# Patient Record
Sex: Male | Born: 1945 | Race: White | Hispanic: No | Marital: Married | Smoking: Former smoker
Health system: Southern US, Community
[De-identification: ages and names within clinical notes are randomized; demographics above are authoritative.]

## PROBLEM LIST (undated history)

## (undated) DIAGNOSIS — E785 Hyperlipidemia, unspecified: Secondary | ICD-10-CM

## (undated) DIAGNOSIS — I10 Essential (primary) hypertension: Secondary | ICD-10-CM

## (undated) DIAGNOSIS — E669 Obesity, unspecified: Secondary | ICD-10-CM

## (undated) DIAGNOSIS — E119 Type 2 diabetes mellitus without complications: Secondary | ICD-10-CM

## (undated) DIAGNOSIS — M1990 Unspecified osteoarthritis, unspecified site: Secondary | ICD-10-CM

## (undated) DIAGNOSIS — K219 Gastro-esophageal reflux disease without esophagitis: Secondary | ICD-10-CM

## (undated) DIAGNOSIS — D580 Hereditary spherocytosis: Secondary | ICD-10-CM

## (undated) DIAGNOSIS — J189 Pneumonia, unspecified organism: Secondary | ICD-10-CM

## (undated) HISTORY — PX: COLONOSCOPY: SHX174

## (undated) HISTORY — PX: SPLENECTOMY: SUR1306

## (undated) HISTORY — PX: HERNIA REPAIR: SHX51

---

## 2009-10-15 ENCOUNTER — Emergency Department: Payer: Self-pay | Admitting: Emergency Medicine

## 2013-06-26 DIAGNOSIS — E1140 Type 2 diabetes mellitus with diabetic neuropathy, unspecified: Secondary | ICD-10-CM | POA: Insufficient documentation

## 2013-06-26 DIAGNOSIS — E782 Mixed hyperlipidemia: Secondary | ICD-10-CM | POA: Insufficient documentation

## 2013-06-26 DIAGNOSIS — E6609 Other obesity due to excess calories: Secondary | ICD-10-CM | POA: Insufficient documentation

## 2013-06-26 DIAGNOSIS — E7800 Pure hypercholesterolemia, unspecified: Secondary | ICD-10-CM | POA: Insufficient documentation

## 2014-03-22 ENCOUNTER — Ambulatory Visit: Payer: Self-pay | Admitting: Gastroenterology

## 2014-03-22 DIAGNOSIS — D126 Benign neoplasm of colon, unspecified: Secondary | ICD-10-CM

## 2014-03-22 DIAGNOSIS — K648 Other hemorrhoids: Secondary | ICD-10-CM

## 2014-03-22 HISTORY — DX: Other hemorrhoids: K64.8

## 2014-03-22 HISTORY — DX: Benign neoplasm of colon, unspecified: D12.6

## 2014-05-31 LAB — SURGICAL PATHOLOGY

## 2014-10-15 DIAGNOSIS — I10 Essential (primary) hypertension: Secondary | ICD-10-CM | POA: Insufficient documentation

## 2015-07-14 ENCOUNTER — Emergency Department
Admission: EM | Admit: 2015-07-14 | Discharge: 2015-07-14 | Disposition: A | Discharge status: Home / Self Care | Payer: Medicare Other | Attending: Emergency Medicine | Admitting: Emergency Medicine

## 2015-07-14 ENCOUNTER — Encounter: Payer: Self-pay | Admitting: Emergency Medicine

## 2015-07-14 DIAGNOSIS — Z7984 Long term (current) use of oral hypoglycemic drugs: Secondary | ICD-10-CM | POA: Insufficient documentation

## 2015-07-14 DIAGNOSIS — Z79899 Other long term (current) drug therapy: Secondary | ICD-10-CM | POA: Insufficient documentation

## 2015-07-14 DIAGNOSIS — E119 Type 2 diabetes mellitus without complications: Secondary | ICD-10-CM | POA: Insufficient documentation

## 2015-07-14 DIAGNOSIS — Z7982 Long term (current) use of aspirin: Secondary | ICD-10-CM | POA: Insufficient documentation

## 2015-07-14 DIAGNOSIS — R531 Weakness: Secondary | ICD-10-CM | POA: Diagnosis not present

## 2015-07-14 DIAGNOSIS — R42 Dizziness and giddiness: Secondary | ICD-10-CM | POA: Diagnosis present

## 2015-07-14 DIAGNOSIS — I10 Essential (primary) hypertension: Secondary | ICD-10-CM | POA: Diagnosis not present

## 2015-07-14 HISTORY — DX: Essential (primary) hypertension: I10

## 2015-07-14 HISTORY — DX: Type 2 diabetes mellitus without complications: E11.9

## 2015-07-14 LAB — BASIC METABOLIC PANEL
Anion gap: 8 (ref 5–15)
BUN: 26 mg/dL — AB (ref 6–20)
CHLORIDE: 106 mmol/L (ref 101–111)
CO2: 24 mmol/L (ref 22–32)
CREATININE: 1.32 mg/dL — AB (ref 0.61–1.24)
Calcium: 9.2 mg/dL (ref 8.9–10.3)
GFR calc Af Amer: >60 mL/min (ref >60)
GFR calc non Af Amer: 54 mL/min — ABNORMAL LOW (ref >60)
Glucose, Bld: 162 mg/dL — ABNORMAL HIGH (ref 65–99)
POTASSIUM: 4 mmol/L (ref 3.5–5.1)
Sodium: 138 mmol/L (ref 135–145)

## 2015-07-14 LAB — TROPONIN I
Troponin I: <0.03 ng/mL (ref <0.031)
Troponin I: <0.03 ng/mL (ref <0.031)

## 2015-07-14 LAB — CBC
HEMATOCRIT: 46.2 % (ref 40.0–52.0)
Hemoglobin: 16 g/dL (ref 13.0–18.0)
MCH: 28.9 pg (ref 26.0–34.0)
MCHC: 34.6 g/dL (ref 32.0–36.0)
MCV: 83.4 fL (ref 80.0–100.0)
PLATELETS: 441 10*3/uL — AB (ref 150–440)
RBC: 5.54 MIL/uL (ref 4.40–5.90)
RDW: 14.7 % — AB (ref 11.5–14.5)
WBC: 20.7 10*3/uL — ABNORMAL HIGH (ref 3.8–10.6)

## 2015-07-14 NOTE — ED Notes (Signed)
Pt given drinks for self and family. Pt tolerating well, NAD noted at this time

## 2015-07-14 NOTE — Discharge Instructions (Signed)

## 2015-07-14 NOTE — ED Notes (Signed)
Pt and wife report pt with BP 50/48 at home on 3 different machines; pt with dizziness and lightheadedness.

## 2015-07-14 NOTE — ED Provider Notes (Signed)
[facility] Emergency Department Provider Note   ____________________________________________  Time seen: Approximately 940 PM  I have reviewed the triage vital signs and the nursing notes.   HISTORY  Chief Complaint Dizziness    HPI Brent [name]. is a 69 y.o. male with a history of diabetes and hypertension who is presenting to the emergency department with an episode of weakness this afternoon. He says that he was mowing for lawns and then became weak. He denies feeling like he would pass out. He denies any chest pain or shortness of breath. Says that he feels back to normal at this time and says that he has been feeling normal since he has arrived at the emergency department. Denies any dizziness. Michela Pitcher that he only had a half of a diet drink today and did not trigger any water despite working outside for a lengthy period of time. When he felt the weakness he took his blood pressure and was found to be hypotensive at home on multiple blood pressure machines. At the lowest he was a 50 systolic blood pressure.   Past Medical History  Diagnosis Date  . Hypertension   . Diabetes mellitus without complication ([place])     There are no active problems to display for this patient.   Past Surgical History  Procedure Laterality Date  . Splenectomy      Current Outpatient Rx  Name  Route  Sig  Dispense  Refill  . aspirin EC 81 MG tablet   Oral   Take 1 tablet by mouth daily.         . hydrochlorothiazide (HYDRODIURIL) 12.5 MG tablet   Oral   Take 1 tablet by mouth daily.         Marland Kitchen lisinopril (PRINIVIL,ZESTRIL) 40 MG tablet   Oral   Take 1 tablet by mouth daily.         Marland Kitchen lovastatin (MEVACOR) 40 MG tablet   Oral   Take 2 tablets by mouth at bedtime.         . metFORMIN (GLUCOPHAGE) 1000 MG tablet   Oral   Take 500 mg by mouth 2 (two) times daily with a meal.            Allergies Review of patient's allergies indicates no known  allergies.  No family history on file.  Social History Social History  Substance Use Topics  . Smoking status: Former Research scientist (life sciences)  . Smokeless tobacco: None  . Alcohol Use: None    Review of Systems Constitutional: No fever/chills Eyes: No visual changes. ENT: No sore throat. Cardiovascular: Denies chest pain. Respiratory: Denies shortness of breath. Gastrointestinal: No abdominal pain.  No nausea, no vomiting.  No diarrhea.  No constipation. Genitourinary: Negative for dysuria. Musculoskeletal: Negative for back pain. Skin: Negative for rash. Neurological: Negative for headaches, focal weakness or numbness.  10-point ROS otherwise negative.  ____________________________________________   PHYSICAL EXAM:  VITAL SIGNS: ED Triage Vitals  Enc Vitals Group     BP 07/14/15 1834 138/68 mmHg     Pulse Rate 07/14/15 1834 85     Resp 07/14/15 1834 16     Temp 07/14/15 1834 97.7 F (36.5 C)     Temp Source 07/14/15 1834 Oral     SpO2 07/14/15 1834 94 %     Weight 07/14/15 1834 216 lb (97.977 kg)     Height 07/14/15 1834 5\' 8"  (1.727 m)     Head Cir --  Peak Flow --      Pain Score --      Pain Loc --      Pain Edu? --      Excl. in [place]? --     Constitutional: Alert and oriented. Well appearing and in no acute distress. Eyes: Conjunctivae are normal. PERRL. EOMI. Head: Atraumatic. Nose: No congestion/rhinnorhea. Mouth/Throat: Mucous membranes are moist.   Neck: No stridor.   Cardiovascular: Normal rate, regular rhythm. Grossly normal heart sounds.   Respiratory: Normal respiratory effort.  No retractions. Lungs CTAB. Gastrointestinal: Soft and nontender. No distention.  Musculoskeletal: No lower extremity tenderness nor edema.  No joint effusions. Neurologic:  Normal speech and language. No gross focal neurologic deficits are appreciated. No gait instability. Skin:  Skin is warm, dry and intact. No rash noted. Psychiatric: Mood and affect are normal. Speech and  behavior are normal.  ____________________________________________   LABS (all labs ordered are listed, but only abnormal results are displayed)  Labs Reviewed  BASIC METABOLIC PANEL - Abnormal; Notable for the following:    Glucose, Bld 162 (*)    BUN 26 (*)    Creatinine, Ser 1.32 (*)    GFR calc non Af Amer 54 (*)    All other components within normal limits  CBC - Abnormal; Notable for the following:    WBC 20.7 (*)    RDW 14.7 (*)    Platelets 441 (*)    All other components within normal limits  TROPONIN I  TROPONIN I  URINALYSIS COMPLETEWITH MICROSCOPIC ([facility] ONLY)   ____________________________________________  EKG  ED ECG REPORT I, Doran Stabler, the attending physician, personally viewed and interpreted this ECG.   Date: 07/14/2015  EKG Time: 1843  Rate: 93  Rhythm: normal sinus rhythm  Axis: Normal  Intervals:first-degree A-V block right bundle branch block  ST&T Change: T wave inversions in V2 and V3. No previous EKG for comparison. ____________________________________________  RADIOLOGY   ____________________________________________   PROCEDURES  ____________________________________________   INITIAL IMPRESSION / ASSESSMENT AND PLAN / ED COURSE  Pertinent labs & imaging results that were available during my care of the patient were reviewed by me and considered in my medical decision making (see chart for details).  ----------------------------------------- 11:17 PM on 07/14/2015 -----------------------------------------  Patient reevaluated. Discussed the lab results in clearing 2 negative troponins. Was found to have some signs of mild dehydration on his renal function. Also with a white blood cell count of 20. The patient knows that he has a chronically elevated white blood cell count but it is usually more around 14 or so. He denies any complaints at this time. Denied any fever. Unclear cause for the elevated white blood cell count.  It gave him strict return precautions come back for any worsening or concerning symptoms especially fever or he understands this plan and is willing to comply. Says that he is hungry and would like to go home. He also knows that he will need to stay hydrated. ____________________________________________   FINAL CLINICAL IMPRESSION(S) / ED DIAGNOSES  Weakness    NEW MEDICATIONS STARTED DURING THIS VISIT:  New Prescriptions   No medications on file     Note:  This document was prepared using Dragon voice recognition software and may include unintentional dictation errors.     Orbie Pyo, MD 07/14/15 [phone]

## 2015-10-07 HISTORY — PX: CARPAL TUNNEL RELEASE: SHX101

## 2016-03-05 DIAGNOSIS — Z862 Personal history of diseases of the blood and blood-forming organs and certain disorders involving the immune mechanism: Secondary | ICD-10-CM | POA: Insufficient documentation

## 2017-08-02 ENCOUNTER — Encounter: Payer: Self-pay | Admitting: *Deleted

## 2017-08-05 ENCOUNTER — Encounter
Admission: RE | Disposition: A | Discharge status: Home / Self Care | Payer: Self-pay | Source: Ambulatory Visit | Attending: Unknown Physician Specialty

## 2017-08-05 ENCOUNTER — Ambulatory Visit: Payer: Medicare Other | Admitting: Anesthesiology

## 2017-08-05 ENCOUNTER — Ambulatory Visit
Admission: RE | Admit: 2017-08-05 | Discharge: 2017-08-05 | Disposition: A | Discharge status: Home / Self Care | Payer: Medicare Other | Source: Ambulatory Visit | Attending: Unknown Physician Specialty | Admitting: Unknown Physician Specialty

## 2017-08-05 DIAGNOSIS — E785 Hyperlipidemia, unspecified: Secondary | ICD-10-CM | POA: Diagnosis not present

## 2017-08-05 DIAGNOSIS — Z1211 Encounter for screening for malignant neoplasm of colon: Secondary | ICD-10-CM | POA: Insufficient documentation

## 2017-08-05 DIAGNOSIS — Z86010 Personal history of colonic polyps: Secondary | ICD-10-CM | POA: Insufficient documentation

## 2017-08-05 DIAGNOSIS — D124 Benign neoplasm of descending colon: Secondary | ICD-10-CM | POA: Diagnosis not present

## 2017-08-05 DIAGNOSIS — E119 Type 2 diabetes mellitus without complications: Secondary | ICD-10-CM | POA: Insufficient documentation

## 2017-08-05 DIAGNOSIS — Z7984 Long term (current) use of oral hypoglycemic drugs: Secondary | ICD-10-CM | POA: Diagnosis not present

## 2017-08-05 DIAGNOSIS — Z87891 Personal history of nicotine dependence: Secondary | ICD-10-CM | POA: Insufficient documentation

## 2017-08-05 DIAGNOSIS — Z7982 Long term (current) use of aspirin: Secondary | ICD-10-CM | POA: Insufficient documentation

## 2017-08-05 DIAGNOSIS — K640 First degree hemorrhoids: Secondary | ICD-10-CM | POA: Insufficient documentation

## 2017-08-05 DIAGNOSIS — I10 Essential (primary) hypertension: Secondary | ICD-10-CM | POA: Insufficient documentation

## 2017-08-05 DIAGNOSIS — Z79899 Other long term (current) drug therapy: Secondary | ICD-10-CM | POA: Insufficient documentation

## 2017-08-05 DIAGNOSIS — D122 Benign neoplasm of ascending colon: Secondary | ICD-10-CM | POA: Insufficient documentation

## 2017-08-05 DIAGNOSIS — D123 Benign neoplasm of transverse colon: Secondary | ICD-10-CM | POA: Insufficient documentation

## 2017-08-05 HISTORY — DX: Hyperlipidemia, unspecified: E78.5

## 2017-08-05 HISTORY — PX: COLONOSCOPY WITH PROPOFOL: SHX5780

## 2017-08-05 LAB — GLUCOSE, CAPILLARY: GLUCOSE-CAPILLARY: 125 mg/dL — AB (ref 70–99)

## 2017-08-05 SURGERY — COLONOSCOPY WITH PROPOFOL
Anesthesia: General

## 2017-08-05 MED ORDER — PROPOFOL 10 MG/ML IV BOLUS
INTRAVENOUS | Status: DC | PRN
  Administered 2017-08-05: 50 mg via INTRAVENOUS

## 2017-08-05 MED ORDER — SODIUM CHLORIDE 0.9 % IV SOLN: INTRAVENOUS | Status: DC

## 2017-08-05 MED ORDER — EPHEDRINE SULFATE 50 MG/ML IJ SOLN
INTRAMUSCULAR | Status: DC | PRN
  Administered 2017-08-05 (×2): 10 mg via INTRAVENOUS

## 2017-08-05 MED ORDER — PHENYLEPHRINE HCL 10 MG/ML IJ SOLN
INTRAMUSCULAR | Status: DC | PRN
  Administered 2017-08-05: 100 ug via INTRAVENOUS

## 2017-08-05 MED ORDER — FENTANYL CITRATE (PF) 100 MCG/2ML IJ SOLN
INTRAMUSCULAR | Status: DC | PRN
  Administered 2017-08-05: 25 ug via INTRAVENOUS

## 2017-08-05 MED ORDER — FENTANYL CITRATE (PF) 100 MCG/2ML IJ SOLN
INTRAMUSCULAR | Status: AC
  Filled 2017-08-05: qty 2

## 2017-08-05 MED ORDER — SODIUM CHLORIDE 0.9 % IV SOLN
INTRAVENOUS | Status: DC
  Administered 2017-08-05: 07:00:00 via INTRAVENOUS

## 2017-08-05 MED ORDER — PROPOFOL 500 MG/50ML IV EMUL
INTRAVENOUS | Status: AC
Start: 2017-08-05 — End: 2017-08-05
  Filled 2017-08-05: qty 50

## 2017-08-05 MED ORDER — PROPOFOL 500 MG/50ML IV EMUL
INTRAVENOUS | Status: DC | PRN
  Administered 2017-08-05: 180 ug/kg/min via INTRAVENOUS

## 2017-08-05 NOTE — Anesthesia Procedure Notes (Signed)
Date/Time: 08/05/2017 7:40 AM Performed by: Allean Found, CRNA Pre-anesthesia Checklist: Patient identified, Emergency Drugs available, Suction available, Patient being monitored and Timeout performed Patient Re-evaluated:Patient Re-evaluated prior to induction Oxygen Delivery Method: Simple face mask Induction Type: IV induction Placement Confirmation: positive ETCO2

## 2017-08-05 NOTE — Anesthesia Postprocedure Evaluation (Signed)
Anesthesia Post Note  Patient: Brent [name].  Procedure(s) Performed: COLONOSCOPY WITH PROPOFOL (N/A )  Patient location during evaluation: Endoscopy Anesthesia Type: General Level of consciousness: awake and alert Pain management: pain level controlled Vital Signs Assessment: post-procedure vital signs reviewed and stable Respiratory status: spontaneous breathing, nonlabored ventilation, respiratory function stable and patient connected to nasal cannula oxygen Cardiovascular status: blood pressure returned to baseline and stable Postop Assessment: no apparent nausea or vomiting Anesthetic complications: no     Last Vitals:  Vitals:   08/05/17 0841 08/05/17 0851  BP: (!) 111/51 123/64  Pulse: (!) 59 60  Resp: 19 18  Temp:    SpO2: 96% 94%    Last Pain:  Vitals:   08/05/17 0821  TempSrc: Tympanic  PainSc: 0-No pain                 Silas Sedam S

## 2017-08-05 NOTE — Transfer of Care (Signed)
Immediate Anesthesia Transfer of Care Note  Patient: Brent [name].  Procedure(s) Performed: COLONOSCOPY WITH PROPOFOL (N/A )  Patient Location: PACU  Anesthesia Type:General  Level of Consciousness: awake  Airway & Oxygen Therapy: Patient Spontanous Breathing and Patient connected to face mask oxygen  Post-op Assessment: Report given to RN and Post -op Vital signs reviewed and stable  Post vital signs: Reviewed and stable  Last Vitals:  Vitals Value Taken Time  BP    Temp 36.1 C 08/05/2017  8:21 AM  Pulse    Resp    SpO2      Last Pain:  Vitals:   08/05/17 0821  TempSrc: Tympanic         Complications: No apparent anesthesia complications

## 2017-08-05 NOTE — H&P (Signed)
Primary Care Physician:  [name], MD Primary Gastroenterologist:  Dr. [name]  Pre-Procedure History & Physical: HPI:  Cuauhtemoc Huegel. is a 72 y.o. male is here for an colonoscopy.  Done for history of colon polyps.   Past Medical History:  Diagnosis Date  . Diabetes mellitus without complication ([place])   . Hyperlipidemia   . Hypertension     Past Surgical History:  Procedure Laterality Date  . CARPAL TUNNEL RELEASE Left 10/2015  . COLONOSCOPY    . SPLENECTOMY      Prior to Admission medications   Medication Sig Start Date End Date Taking? Authorizing Provider  atorvastatin (LIPITOR) 40 MG tablet Take 40 mg by mouth daily.   Yes [provider]  triamcinolone cream (KENALOG) 0.5 % Apply 1 application topically 2 (two) times daily.   Yes [provider]  aspirin EC 81 MG tablet Take 1 tablet by mouth daily.    [provider]  hydrochlorothiazide (HYDRODIURIL) 12.5 MG tablet Take 1 tablet by mouth daily. 06/30/15   [provider]  lisinopril (PRINIVIL,ZESTRIL) 40 MG tablet Take 1 tablet by mouth daily. 05/01/15   [provider]  lovastatin (MEVACOR) 40 MG tablet Take 2 tablets by mouth at bedtime. 03/14/15   [provider]  metFORMIN (GLUCOPHAGE) 1000 MG tablet Take 500 mg by mouth 2 (two) times daily with a meal.  04/28/15   [provider]    Allergies as of 05/29/2017  . (No Known Allergies)    History reviewed. No pertinent family history.  Social History   Socioeconomic History  . Marital status: Married    Spouse name: Not on file  . Number of children: Not on file  . Years of education: Not on file  . Highest education level: Not on file  Occupational History  . Not on file  Social Needs  . Financial resource strain: Not on file  . Food insecurity:    Worry: Not on file    Inability: Not on file  . Transportation needs:    Medical: Not on file    Non-medical: Not on file   Tobacco Use  . Smoking status: Former Research scientist (life sciences)  . Smokeless tobacco: Never Used  Substance and Sexual Activity  . Alcohol use: Not Currently  . Drug use: Never  . Sexual activity: Not on file  Lifestyle  . Physical activity:    Days per week: Not on file    Minutes per session: Not on file  . Stress: Not on file  Relationships  . Social connections:    Talks on phone: Not on file    Gets together: Not on file    Attends religious service: Not on file    Active member of club or organization: Not on file    Attends meetings of clubs or organizations: Not on file    Relationship status: Not on file  . Intimate partner violence:    Fear of current or ex partner: Not on file    Emotionally abused: Not on file    Physically abused: Not on file    Forced sexual activity: Not on file  Other Topics Concern  . Not on file  Social History Narrative  . Not on file    Review of Systems: See HPI, otherwise negative ROS  Physical Exam: BP (!) 170/81   Pulse 64   Temp (!) 97.5 F (36.4 C) (Tympanic)   Resp 20   Ht 5\' 8"  (1.727 m)  Wt 99.8 kg (220 lb)   SpO2 100%   BMI 33.45 kg/m  General:   Alert,  pleasant and cooperative in NAD Head:  Normocephalic and atraumatic. Neck:  Supple; no masses or thyromegaly. Lungs:  Clear throughout to auscultation.    Heart:  Regular rate and rhythm. Abdomen:  Soft, nontender and nondistended. Normal bowel sounds, without guarding, and without rebound.   Neurologic:  Alert and  oriented x4;  grossly normal neurologically.  Impression/Plan: Wynona Canes. is here for an colonoscopy to be performed for [facility] colon polyps.  Risks, benefits, limitations, and alternatives regarding  colonoscopy have been reviewed with the patient.  Questions have been answered.  All parties agreeable.   Gaylyn Cheers, MD  08/05/2017, 7:30 AM

## 2017-08-05 NOTE — Anesthesia Post-op Follow-up Note (Signed)
Anesthesia QCDR form completed.        

## 2017-08-05 NOTE — Anesthesia Preprocedure Evaluation (Signed)
Anesthesia Evaluation  Patient identified by MRN, date of birth, ID band Patient awake    Reviewed: Allergy & Precautions, NPO status , Patient's Chart, lab work & pertinent test results, reviewed documented beta blocker date and time   Airway Mallampati: III  TM Distance: >3 FB     Dental  (+) Chipped   Pulmonary former smoker,           Cardiovascular hypertension, Pt. on medications      Neuro/Psych    GI/Hepatic   Endo/Other  diabetes, Type 2  Renal/GU      Musculoskeletal   Abdominal   Peds  Hematology   Anesthesia Other Findings Obese.  Reproductive/Obstetrics                             Anesthesia Physical Anesthesia Plan  ASA: III  Anesthesia Plan: General   Post-op Pain Management:    Induction: Intravenous  PONV Risk Score and Plan:   Airway Management Planned:   Additional Equipment:   Intra-op Plan:   Post-operative Plan:   Informed Consent: I have reviewed the patients History and Physical, chart, labs and discussed the procedure including the risks, benefits and alternatives for the proposed anesthesia with the patient or authorized representative who has indicated his/her understanding and acceptance.     Plan Discussed with: CRNA  Anesthesia Plan Comments:         Anesthesia Quick Evaluation

## 2017-08-05 NOTE — Op Note (Signed)
[facility] Patient Name: Brent [name] Procedure Date: 08/05/2017 7:32 AM MRN: [id number] Account #: [id number] Date of Birth: 30-Dec-1945 Admit Type: Outpatient Age: 72 Room: [facility] ENDO ROOM 1 Gender: Male Note Status: Finalized Procedure:            Colonoscopy Indications:          High risk colon cancer surveillance: Personal history                        of colonic polyps Providers:            Manya Silvas, MD Referring MD:         [name] (Referring MD) Medicines:            Propofol per Anesthesia Complications:        No immediate complications. Procedure:            Pre-Anesthesia Assessment:                       - After reviewing the risks and benefits, the patient                        was deemed in satisfactory condition to undergo the                        procedure.                       After obtaining informed consent, the colonoscope was                        passed under direct vision. Throughout the procedure,                        the patient's blood pressure, pulse, and oxygen                        saturations were monitored continuously. The                        Colonoscope was introduced through the anus and                        advanced to the the cecum, identified by appendiceal                        orifice and ileocecal valve. The colonoscopy was                        performed without difficulty. The patient tolerated the                        procedure well. The quality of the bowel preparation                        was good. Findings:      A small polyp was found in the ascending colon. The polyp was sessile.       The polyp was removed with a hot snare. Resection and retrieval were       complete. Biopsies for histology were taken with a cold forceps for  histology.      A diminutive polyp was found in the transverse colon. The polyp was       sessile. The polyp was removed with a hot  snare. Resection and retrieval       were complete.      A diminutive polyp was found in the transverse colon. The polyp was       sessile. The polyp was removed with a jumbo cold forceps. Resection and       retrieval were complete.      Two sessile polyps were found in the descending colon. The polyps were       diminutive in size. These polyps were removed with a hot snare.       Resection and retrieval were complete.      A diminutive polyp was found in the sigmoid colon. The polyp was       sessile. The polyp was removed with a jumbo cold forceps. Resection and       retrieval were complete.      Internal hemorrhoids were found during endoscopy. The hemorrhoids were       small and Grade I (internal hemorrhoids that do not prolapse).      The exam was otherwise without abnormality. Impression:           - One small polyp in the ascending colon, removed with                        a hot snare. Resected and retrieved. Biopsied.                       - One diminutive polyp in the transverse colon, removed                        with a hot snare. Resected and retrieved.                       - One diminutive polyp in the transverse colon, removed                        with a jumbo cold forceps. Resected and retrieved.                       - Two diminutive polyps in the descending colon,                        removed with a jumbo cold forceps. Resected and                        retrieved.                       - Two diminutive polyps in the descending colon,                        removed with a hot snare. Resected and retrieved.                       - One diminutive polyp in the sigmoid colon, removed                        with a jumbo cold forceps. Resected and  retrieved.                       - Internal hemorrhoids.                       - The examination was otherwise normal. Recommendation:       - Await pathology results. Manya Silvas, MD 08/05/2017 8:24:28 AM This  report has been signed electronically. Number of Addenda: 0 Note Initiated On: 08/05/2017 7:32 AM      [facility]

## 2017-08-06 ENCOUNTER — Encounter: Payer: Self-pay | Admitting: Unknown Physician Specialty

## 2017-08-06 LAB — SURGICAL PATHOLOGY

## 2017-10-08 DIAGNOSIS — N528 Other male erectile dysfunction: Secondary | ICD-10-CM | POA: Insufficient documentation

## 2018-05-06 DIAGNOSIS — F5104 Psychophysiologic insomnia: Secondary | ICD-10-CM | POA: Insufficient documentation

## 2018-05-15 ENCOUNTER — Inpatient Hospital Stay
Admission: EM | Admit: 2018-05-15 | Discharge: 2018-05-20 | DRG: 871 | Disposition: A | Discharge status: Home Health | Payer: Medicare Other | Attending: Internal Medicine | Admitting: Internal Medicine

## 2018-05-15 ENCOUNTER — Encounter: Payer: Self-pay | Admitting: Emergency Medicine

## 2018-05-15 ENCOUNTER — Emergency Department: Payer: Medicare Other

## 2018-05-15 ENCOUNTER — Other Ambulatory Visit: Payer: Self-pay

## 2018-05-15 DIAGNOSIS — E876 Hypokalemia: Secondary | ICD-10-CM | POA: Diagnosis not present

## 2018-05-15 DIAGNOSIS — K8020 Calculus of gallbladder without cholecystitis without obstruction: Secondary | ICD-10-CM

## 2018-05-15 DIAGNOSIS — K8050 Calculus of bile duct without cholangitis or cholecystitis without obstruction: Secondary | ICD-10-CM | POA: Diagnosis not present

## 2018-05-15 DIAGNOSIS — Z9081 Acquired absence of spleen: Secondary | ICD-10-CM

## 2018-05-15 DIAGNOSIS — Z87891 Personal history of nicotine dependence: Secondary | ICD-10-CM

## 2018-05-15 DIAGNOSIS — Z6834 Body mass index (BMI) 34.0-34.9, adult: Secondary | ICD-10-CM

## 2018-05-15 DIAGNOSIS — I4510 Unspecified right bundle-branch block: Secondary | ICD-10-CM | POA: Diagnosis present

## 2018-05-15 DIAGNOSIS — D72829 Elevated white blood cell count, unspecified: Secondary | ICD-10-CM

## 2018-05-15 DIAGNOSIS — E785 Hyperlipidemia, unspecified: Secondary | ICD-10-CM | POA: Diagnosis present

## 2018-05-15 DIAGNOSIS — J9811 Atelectasis: Secondary | ICD-10-CM | POA: Diagnosis not present

## 2018-05-15 DIAGNOSIS — J9601 Acute respiratory failure with hypoxia: Secondary | ICD-10-CM | POA: Diagnosis not present

## 2018-05-15 DIAGNOSIS — R748 Abnormal levels of other serum enzymes: Secondary | ICD-10-CM | POA: Diagnosis not present

## 2018-05-15 DIAGNOSIS — R0902 Hypoxemia: Secondary | ICD-10-CM

## 2018-05-15 DIAGNOSIS — N179 Acute kidney failure, unspecified: Secondary | ICD-10-CM | POA: Diagnosis not present

## 2018-05-15 DIAGNOSIS — I700 Atherosclerosis of aorta: Secondary | ICD-10-CM | POA: Diagnosis present

## 2018-05-15 DIAGNOSIS — Z833 Family history of diabetes mellitus: Secondary | ICD-10-CM | POA: Diagnosis not present

## 2018-05-15 DIAGNOSIS — Z7984 Long term (current) use of oral hypoglycemic drugs: Secondary | ICD-10-CM

## 2018-05-15 DIAGNOSIS — K8040 Calculus of bile duct with cholecystitis, unspecified, without obstruction: Secondary | ICD-10-CM

## 2018-05-15 DIAGNOSIS — I10 Essential (primary) hypertension: Secondary | ICD-10-CM | POA: Diagnosis present

## 2018-05-15 DIAGNOSIS — K269 Duodenal ulcer, unspecified as acute or chronic, without hemorrhage or perforation: Secondary | ICD-10-CM | POA: Diagnosis present

## 2018-05-15 DIAGNOSIS — E872 Acidosis, unspecified: Secondary | ICD-10-CM

## 2018-05-15 DIAGNOSIS — R935 Abnormal findings on diagnostic imaging of other abdominal regions, including retroperitoneum: Secondary | ICD-10-CM

## 2018-05-15 DIAGNOSIS — K8000 Calculus of gallbladder with acute cholecystitis without obstruction: Secondary | ICD-10-CM | POA: Diagnosis not present

## 2018-05-15 DIAGNOSIS — E119 Type 2 diabetes mellitus without complications: Secondary | ICD-10-CM | POA: Diagnosis present

## 2018-05-15 DIAGNOSIS — K8043 Calculus of bile duct with acute cholecystitis with obstruction: Secondary | ICD-10-CM | POA: Diagnosis present

## 2018-05-15 DIAGNOSIS — E669 Obesity, unspecified: Secondary | ICD-10-CM | POA: Diagnosis present

## 2018-05-15 DIAGNOSIS — Z7982 Long term (current) use of aspirin: Secondary | ICD-10-CM | POA: Diagnosis not present

## 2018-05-15 DIAGNOSIS — A419 Sepsis, unspecified organism: Secondary | ICD-10-CM | POA: Diagnosis present

## 2018-05-15 DIAGNOSIS — R1011 Right upper quadrant pain: Secondary | ICD-10-CM | POA: Diagnosis present

## 2018-05-15 DIAGNOSIS — Z8249 Family history of ischemic heart disease and other diseases of the circulatory system: Secondary | ICD-10-CM

## 2018-05-15 LAB — CBC WITH DIFFERENTIAL/PLATELET
Abs Immature Granulocytes: 0.47 10*3/uL — ABNORMAL HIGH (ref 0.00–0.07)
Basophils Absolute: 0.1 10*3/uL (ref 0.0–0.1)
Basophils Relative: 0 %
Eosinophils Absolute: 0 10*3/uL (ref 0.0–0.5)
Eosinophils Relative: 0 %
HCT: 47.5 % (ref 39.0–52.0)
Hemoglobin: 16.2 g/dL (ref 13.0–17.0)
Immature Granulocytes: 1 %
Lymphocytes Relative: 3 %
Lymphs Abs: 1.3 10*3/uL (ref 0.7–4.0)
MCH: 30.2 pg (ref 26.0–34.0)
MCHC: 34.1 g/dL (ref 30.0–36.0)
MCV: 88.6 fL (ref 80.0–100.0)
Monocytes Absolute: 4.4 10*3/uL — ABNORMAL HIGH (ref 0.1–1.0)
Monocytes Relative: 10 %
Neutro Abs: 37.7 10*3/uL — ABNORMAL HIGH (ref 1.7–7.7)
Neutrophils Relative %: 86 %
Platelets: 501 10*3/uL — ABNORMAL HIGH (ref 150–400)
RBC: 5.36 MIL/uL (ref 4.22–5.81)
RDW: 12.9 % (ref 11.5–15.5)
Smear Review: NORMAL
WBC: 44.1 10*3/uL — ABNORMAL HIGH (ref 4.0–10.5)
nRBC: 0 % (ref 0.0–0.2)

## 2018-05-15 LAB — COMPREHENSIVE METABOLIC PANEL
ALT: 15 U/L (ref 0–44)
AST: 25 U/L (ref 15–41)
Albumin: 4.2 g/dL (ref 3.5–5.0)
Alkaline Phosphatase: 78 U/L (ref 38–126)
Anion gap: 16 — ABNORMAL HIGH (ref 5–15)
BUN: 29 mg/dL — ABNORMAL HIGH (ref 8–23)
CO2: 20 mmol/L — ABNORMAL LOW (ref 22–32)
Calcium: 9.7 mg/dL (ref 8.9–10.3)
Chloride: 98 mmol/L (ref 98–111)
Creatinine, Ser: 1.24 mg/dL (ref 0.61–1.24)
GFR calc Af Amer: >60 mL/min (ref >60)
GFR calc non Af Amer: 58 mL/min — ABNORMAL LOW (ref >60)
Glucose, Bld: 342 mg/dL — ABNORMAL HIGH (ref 70–99)
Potassium: 4.1 mmol/L (ref 3.5–5.1)
Sodium: 134 mmol/L — ABNORMAL LOW (ref 135–145)
Total Bilirubin: 1.9 mg/dL — ABNORMAL HIGH (ref 0.3–1.2)
Total Protein: 8.7 g/dL — ABNORMAL HIGH (ref 6.5–8.1)

## 2018-05-15 LAB — URINALYSIS, COMPLETE (UACMP) WITH MICROSCOPIC
Bacteria, UA: NONE SEEN
Bilirubin Urine: NEGATIVE
Glucose, UA: >=500 mg/dL — AB
Hgb urine dipstick: NEGATIVE
Ketones, ur: NEGATIVE mg/dL
Leukocytes,Ua: NEGATIVE
Nitrite: NEGATIVE
Protein, ur: 30 mg/dL — AB
Specific Gravity, Urine: >1.046 — ABNORMAL HIGH (ref 1.005–1.030)
pH: 5 (ref 5.0–8.0)

## 2018-05-15 LAB — GLUCOSE, CAPILLARY
Glucose-Capillary: 310 mg/dL — ABNORMAL HIGH (ref 70–99)
Glucose-Capillary: 236 mg/dL — ABNORMAL HIGH (ref 70–99)
Glucose-Capillary: 171 mg/dL — ABNORMAL HIGH (ref 70–99)

## 2018-05-15 LAB — SURGICAL PCR SCREEN
MRSA, PCR: POSITIVE — AB
Staphylococcus aureus: POSITIVE — AB

## 2018-05-15 LAB — LIPASE, BLOOD: Lipase: 24 U/L (ref 11–51)

## 2018-05-15 MED ORDER — HYDROCODONE-ACETAMINOPHEN 5-325 MG PO TABS
1.0000 | ORAL_TABLET | ORAL | Status: DC | PRN
  Administered 2018-05-15 – 2018-05-16 (×4): 2 via ORAL
  Filled 2018-05-15 (×5): qty 2

## 2018-05-15 MED ORDER — TADALAFIL 20 MG PO TABS: 10.0000 mg | ORAL_TABLET | ORAL | Status: DC

## 2018-05-15 MED ORDER — MUPIROCIN 2 % EX OINT
1.0000 "application " | TOPICAL_OINTMENT | Freq: Two times a day (BID) | CUTANEOUS | Status: DC
  Filled 2018-05-15: qty 22

## 2018-05-15 MED ORDER — ALBUTEROL SULFATE (2.5 MG/3ML) 0.083% IN NEBU: 2.5000 mg | INHALATION_SOLUTION | RESPIRATORY_TRACT | Status: DC | PRN

## 2018-05-15 MED ORDER — HYDROMORPHONE HCL 1 MG/ML IJ SOLN: 1.0000 mg | INTRAMUSCULAR | Status: AC

## 2018-05-15 MED ORDER — ONDANSETRON HCL 4 MG/2ML IJ SOLN
4.0000 mg | Freq: Once | INTRAMUSCULAR | Status: AC
  Administered 2018-05-15: 4 mg via INTRAVENOUS
  Filled 2018-05-15: qty 2

## 2018-05-15 MED ORDER — CHLORHEXIDINE GLUCONATE CLOTH 2 % EX PADS
6.0000 | MEDICATED_PAD | Freq: Every day | CUTANEOUS | Status: DC
  Administered 2018-05-16 – 2018-05-18 (×3): 6 via TOPICAL

## 2018-05-15 MED ORDER — MUPIROCIN 2 % EX OINT
1.0000 "application " | TOPICAL_OINTMENT | Freq: Two times a day (BID) | CUTANEOUS | Status: DC
  Administered 2018-05-15 – 2018-05-20 (×9): 1 via NASAL
  Filled 2018-05-15: qty 22

## 2018-05-15 MED ORDER — LISINOPRIL 20 MG PO TABS
40.0000 mg | ORAL_TABLET | Freq: Every day | ORAL | Status: DC
  Administered 2018-05-15 – 2018-05-16 (×2): 40 mg via ORAL
  Filled 2018-05-15 (×2): qty 2

## 2018-05-15 MED ORDER — POLYETHYLENE GLYCOL 3350 17 G PO PACK: 17.0000 g | PACK | Freq: Every day | ORAL | Status: DC | PRN

## 2018-05-15 MED ORDER — ONDANSETRON HCL 4 MG PO TABS: 4.0000 mg | ORAL_TABLET | Freq: Four times a day (QID) | ORAL | Status: DC | PRN

## 2018-05-15 MED ORDER — HYDROMORPHONE HCL 1 MG/ML IJ SOLN
1.0000 mg | Freq: Once | INTRAMUSCULAR | Status: AC
  Administered 2018-05-15 (×2): 1 mg via INTRAVENOUS
  Filled 2018-05-15: qty 1

## 2018-05-15 MED ORDER — SODIUM CHLORIDE 0.9 % IV SOLN
3.0000 g | Freq: Four times a day (QID) | INTRAVENOUS | Status: DC
  Administered 2018-05-15 – 2018-05-20 (×19): 3 g via INTRAVENOUS
  Filled 2018-05-15 (×22): qty 3

## 2018-05-15 MED ORDER — INSULIN ASPART 100 UNIT/ML ~~LOC~~ SOLN
0.0000 [IU] | Freq: Every day | SUBCUTANEOUS | Status: DC
  Administered 2018-05-16 – 2018-05-17 (×2): 3 [IU] via SUBCUTANEOUS
  Filled 2018-05-15 (×3): qty 1

## 2018-05-15 MED ORDER — IOHEXOL 300 MG/ML  SOLN
100.0000 mL | Freq: Once | INTRAMUSCULAR | Status: AC | PRN
  Administered 2018-05-15: 09:00:00 100 mL via INTRAVENOUS

## 2018-05-15 MED ORDER — ENOXAPARIN SODIUM 40 MG/0.4ML ~~LOC~~ SOLN
40.0000 mg | SUBCUTANEOUS | Status: DC
  Administered 2018-05-15 – 2018-05-19 (×5): 40 mg via SUBCUTANEOUS
  Filled 2018-05-15 (×5): qty 0.4

## 2018-05-15 MED ORDER — SODIUM CHLORIDE 0.9% FLUSH
3.0000 mL | Freq: Two times a day (BID) | INTRAVENOUS | Status: DC
  Administered 2018-05-15 – 2018-05-19 (×5): 3 mL via INTRAVENOUS

## 2018-05-15 MED ORDER — HYDROMORPHONE HCL 1 MG/ML IJ SOLN
INTRAMUSCULAR | Status: AC
  Administered 2018-05-15: 11:00:00 1 mg via INTRAVENOUS
  Filled 2018-05-15: qty 1

## 2018-05-15 MED ORDER — INSULIN ASPART 100 UNIT/ML ~~LOC~~ SOLN
0.0000 [IU] | SUBCUTANEOUS | Status: DC
  Administered 2018-05-15: 3 [IU] via SUBCUTANEOUS
  Administered 2018-05-15: 7 [IU] via SUBCUTANEOUS
  Filled 2018-05-15: qty 1

## 2018-05-15 MED ORDER — INSULIN ASPART 100 UNIT/ML ~~LOC~~ SOLN
0.0000 [IU] | Freq: Three times a day (TID) | SUBCUTANEOUS | Status: DC
  Administered 2018-05-15 – 2018-05-16 (×2): 3 [IU] via SUBCUTANEOUS
  Filled 2018-05-15 (×2): qty 1

## 2018-05-15 MED ORDER — SODIUM CHLORIDE 0.9 % IV BOLUS
1000.0000 mL | Freq: Once | INTRAVENOUS | Status: AC
  Administered 2018-05-15: 1000 mL via INTRAVENOUS

## 2018-05-15 MED ORDER — SODIUM CHLORIDE 0.9 % IV SOLN
1.0000 mg/h | INTRAVENOUS | Status: DC
  Filled 2018-05-15: qty 5

## 2018-05-15 MED ORDER — ATORVASTATIN CALCIUM 20 MG PO TABS
40.0000 mg | ORAL_TABLET | Freq: Every day | ORAL | Status: DC
  Administered 2018-05-15 – 2018-05-19 (×5): 40 mg via ORAL
  Filled 2018-05-15 (×5): qty 2

## 2018-05-15 MED ORDER — LACTATED RINGERS IV SOLN
INTRAVENOUS | Status: DC
  Administered 2018-05-15 – 2018-05-20 (×8): via INTRAVENOUS

## 2018-05-15 MED ORDER — MORPHINE SULFATE (PF) 2 MG/ML IV SOLN
2.0000 mg | INTRAVENOUS | Status: DC | PRN
  Administered 2018-05-15 – 2018-05-16 (×5): 2 mg via INTRAVENOUS
  Filled 2018-05-15 (×5): qty 1

## 2018-05-15 MED ORDER — HYDROMORPHONE HCL 1 MG/ML PO LIQD
1.0000 mg | Freq: Once | ORAL | Status: DC
  Filled 2018-05-15: qty 1

## 2018-05-15 MED ORDER — SODIUM CHLORIDE 0.9 % IV BOLUS
1000.0000 mL | Freq: Once | INTRAVENOUS | Status: AC
  Administered 2018-05-15: 15:00:00 1000 mL via INTRAVENOUS

## 2018-05-15 MED ORDER — PIPERACILLIN-TAZOBACTAM 3.375 G IVPB 30 MIN
3.3750 g | Freq: Once | INTRAVENOUS | Status: AC
  Administered 2018-05-15: 10:00:00 3.375 g via INTRAVENOUS
  Filled 2018-05-15: qty 50

## 2018-05-15 MED ORDER — ACETAMINOPHEN 325 MG PO TABS: 650.0000 mg | ORAL_TABLET | Freq: Four times a day (QID) | ORAL | Status: DC | PRN

## 2018-05-15 MED ORDER — ACETAMINOPHEN 650 MG RE SUPP: 650.0000 mg | Freq: Four times a day (QID) | RECTAL | Status: DC | PRN

## 2018-05-15 MED ORDER — ONDANSETRON HCL 4 MG/2ML IJ SOLN: 4.0000 mg | Freq: Four times a day (QID) | INTRAMUSCULAR | Status: DC | PRN

## 2018-05-15 NOTE — Consult Note (Signed)
SURGICAL CONSULTATION NOTE   HISTORY OF PRESENT ILLNESS (HPI):  73 y.o. male presented to [facility] ED for evaluation of abdominal pain. Patient reports pain started 2 days ago.  He thought that it was a heartburn but the pain continued to increase in the last 2 to 3 days.  Pain started on epigastric area and then radiated to the right upper quadrant.  There is no alleviating or aggravating factors.  Patient denies fever chills.  Pain is constant.  He reports that after admission with the current pain medication antibiotic therapy the pain has started to improve.  Refers associated nausea but no vomiting.  At the ED he had a CT scan done that shows stones in the gallbladder and on the common bile duct.  Labs shows hyperbilirubinemia of 1.9.  Normal lipase.  White blood cell count and 44,000.  Patient baseline is usually between 15-20,000.  Patient has history of splenectomy that can explain the exaggerated elevation of the white blood cell count and platelet count.  I personally reviewed the images.  There is associated inflammation around the gallbladder consistent with cholecystitis.  Surgery is consulted by Dr. Joni Fears in this context for evaluation and management of cholecystitis.  PAST MEDICAL HISTORY (PMH):  Past Medical History:  Diagnosis Date  . Diabetes mellitus without complication ([place])   . Hyperlipidemia   . Hypertension      PAST SURGICAL HISTORY ([place]):  Past Surgical History:  Procedure Laterality Date  . CARPAL TUNNEL RELEASE Left 10/2015  . COLONOSCOPY    . COLONOSCOPY WITH PROPOFOL N/A 08/05/2017   Procedure: COLONOSCOPY WITH PROPOFOL;  Surgeon: Manya Silvas, MD;  Location: [facility] ENDOSCOPY;  Service: Endoscopy;  Laterality: N/A;  . SPLENECTOMY       MEDICATIONS:  Prior to Admission medications   Medication Sig Start Date End Date Taking? Authorizing Provider  aspirin EC 81 MG tablet Take 81 mg by mouth daily.    Yes [provider]  atorvastatin (LIPITOR) 40  MG tablet Take 40 mg by mouth daily.   Yes [provider]  hydrochlorothiazide (HYDRODIURIL) 12.5 MG tablet Take 1 tablet by mouth daily. 06/30/15  Yes [provider]  lisinopril (PRINIVIL,ZESTRIL) 40 MG tablet Take 1 tablet by mouth daily. 05/01/15  Yes [provider]  metFORMIN (GLUCOPHAGE) 500 MG tablet Take 500 mg by mouth daily with breakfast. 02/28/18  Yes [provider]  ketoconazole (NIZORAL) 2 % cream Apply 1 application topically at bedtime. 01/27/18   [provider]  loratadine (CLARITIN) 10 MG tablet Take 10 mg by mouth daily as needed for allergies.    [provider]  tadalafil (CIALIS) 10 MG tablet Take 10 mg by mouth as directed. 04/10/18   [provider]  triamcinolone cream (KENALOG) 0.5 % Apply 1 application topically 2 (two) times daily.    [provider]     ALLERGIES:  No Known Allergies   SOCIAL HISTORY:  Social History   Socioeconomic History  . Marital status: Married    Spouse name: Not on file  . Number of children: Not on file  . Years of education: Not on file  . Highest education level: Not on file  Occupational History  . Not on file  Social Needs  . Financial resource strain: Not on file  . Food insecurity:    Worry: Not on file    Inability: Not on file  . Transportation needs:    Medical: Not on file    Non-medical:  Not on file  Tobacco Use  . Smoking status: Former Research scientist (life sciences)  . Smokeless tobacco: Never Used  Substance and Sexual Activity  . Alcohol use: Not Currently  . Drug use: Never  . Sexual activity: Not on file  Lifestyle  . Physical activity:    Days per week: Not on file    Minutes per session: Not on file  . Stress: Not on file  Relationships  . Social connections:    Talks on phone: Not on file    Gets together: Not on file    Attends religious service: Not on file    Active member of club or organization: Not on file    Attends meetings of clubs or  organizations: Not on file    Relationship status: Not on file  . Intimate partner violence:    Fear of current or ex partner: Not on file    Emotionally abused: Not on file    Physically abused: Not on file    Forced sexual activity: Not on file  Other Topics Concern  . Not on file  Social History Narrative  . Not on file    The patient currently resides (home / rehab facility / nursing home): Home The patient normally is (ambulatory / bedbound): Ambulatory   FAMILY HISTORY:  No family history on file.   REVIEW OF SYSTEMS:  Constitutional: denies weight loss, fever, chills, or sweats  Eyes: denies any other vision changes, history of eye injury  ENT: denies sore throat, hearing problems  Respiratory: denies shortness of breath, wheezing  Cardiovascular: denies chest pain, palpitations  Gastrointestinal: Positive abdominal pain, and nausea, denies vomiting or diarrhea Genitourinary: denies burning with urination or urinary frequency Musculoskeletal: denies any other joint pains or cramps  Skin: denies any other rashes or skin discolorations  Neurological: denies any other headache, dizziness, weakness  Psychiatric: denies any other depression, anxiety   All other review of systems were negative   VITAL SIGNS:  Temp:  [98.6 F (37 C)] 98.6 F (37 C) (04/09 0819) Pulse Rate:  [75-99] 96 (04/09 1149) Resp:  [20-28] 26 (04/09 1149) BP: (132-162)/(69-92) 137/75 (04/09 1149) SpO2:  [84 %-98 %] 92 % (04/09 1149) Weight:  [104.3 kg] 104.3 kg (04/09 0806)     Height: 5\' 8"  (172.7 cm) Weight: 104.3 kg BMI (Calculated): 34.98   PHYSICAL EXAM:  Constitutional:  -- Normal body habitus  -- Awake, alert, and oriented x3  Eyes:  -- Pupils equally round and reactive to light  -- No scleral icterus  Ear, nose, and throat:  -- No jugular venous distension  Pulmonary:  -- No crackles  -- Equal breath sounds bilaterally -- Breathing non-labored at rest Cardiovascular:  -- S1, S2  present  -- No pericardial rubs Gastrointestinal:  -- Abdomen soft, tender on right upper quadrant, non-distended, no guarding or rebound tenderness -- No abdominal masses appreciated, pulsatile or otherwise  Musculoskeletal and Integumentary:  -- Wounds or skin discoloration: None appreciated -- Extremities: B/L UE and LE FROM, hands and feet warm, no edema  Neurologic:  -- Motor function: intact and symmetric -- Sensation: intact and symmetric   Labs:  CBC Latest Ref Rng & Units 05/15/2018 07/14/2015  WBC 4.0 - 10.5 K/uL 44.1(H) 20.7(H)  Hemoglobin 13.0 - 17.0 g/dL 16.2 16.0  Hematocrit 39.0 - 52.0 % 47.5 46.2  Platelets 150 - 400 K/uL 501(H) 441(H)   CMP Latest Ref Rng & Units 05/15/2018 07/14/2015  Glucose 70 - 99 mg/dL 342(H) 162(H)  BUN 8 - 23 mg/dL 29(H) 26(H)  Creatinine 0.61 - 1.24 mg/dL 1.24 1.32(H)  Sodium 135 - 145 mmol/L 134(L) 138  Potassium 3.5 - 5.1 mmol/L 4.1 4.0  Chloride 98 - 111 mmol/L 98 106  CO2 22 - 32 mmol/L 20(L) 24  Calcium 8.9 - 10.3 mg/dL 9.7 9.2  Total Protein 6.5 - 8.1 g/dL 8.7(H) -  Total Bilirubin 0.3 - 1.2 mg/dL 1.9(H) -  Alkaline Phos 38 - 126 U/L 78 -  AST 15 - 41 U/L 25 -  ALT 0 - 44 U/L 15 -   Imaging studies:  EXAM: CT ABDOMEN AND PELVIS WITH CONTRAST  TECHNIQUE: Multidetector CT imaging of the abdomen and pelvis was performed using the standard protocol following bolus administration of intravenous contrast.  CONTRAST:  144mL OMNIPAQUE IOHEXOL 300 MG/ML  SOLN  COMPARISON:  None.  FINDINGS: Lower chest: Atelectatic changes at the lung bases with no acute finding.  Hepatobiliary: Unremarkable liver. Gallbladder is distended with layered mixed density material at the gallbladder neck. Hyperdense material within the cystic duct, and at the distal common bile duct above the ampulla. No intrahepatic biliary ductal dilatation. Inflammatory changes within the fat adjacent to the gallbladder with trace fluid at the fascial  planes.  Pancreas: Unremarkable pancreas  Spleen: Small volume residual splenic tissue, potentially from prior resection/splenectomy with possible splenosis/splenule is a remaining.  Adrenals/Urinary Tract: Unremarkable adrenal glands.  Bilateral kidneys unremarkable without hydronephrosis or nephrolithiasis. Unremarkable course of the ureters. Urinary bladder relatively decompressed.  Stomach/Bowel: Unremarkable stomach. Small hiatal hernia. Inflammatory changes adjacent to the duodenum are likely secondary to the inflamed gallbladder. Mild dilation of small bowel with air-fluid levels and no evidence of transition. Normal appendix. Mild stool burden. Diverticular change without evidence of acute diverticulitis.  Vascular/Lymphatic: Atherosclerotic changes of the abdominal aorta. Mesenteric and renal arteries are patent. Bilateral iliac arteries and bilateral proximal femoral arteries are patent. Unremarkable venous system.  Reproductive: Unremarkable appearance of the pelvic structures.  Other: .  Trace free fluid within the pelvis.  Musculoskeletal: No displaced fracture. 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 Advanced disc disease at L5-S1. More subtle disc disease throughout the remainder of the lumbar levels. Degenerative changes of the hips.  IMPRESSION: CT findings of acute cholecystitis, with retained choledocholithiasis of the common bile duct. Referral for surgical evaluation is indicated. These results were discussed by telephone at the time of interpretation on 05/15/2018 at 9:28 am with Dr. Carrie Mew.  Aortic Atherosclerosis (ICD10-I70.0).  Additional ancillary findings as above.   Electronically Signed   By: Corrie Mckusick D.O.   On: 05/15/2018 09:30  Assessment/Plan:  73 y.o. male with cholecystitis with choledocholithiasis, complicated by pertinent comorbidities including hypertension and  diabetes.  Patient with at least 3 days of evolving cholecystitis with choledocholithiasis.  In the ideal situation the patient will have ERCP followed by cholecystectomy but due to the history of symptoms of more than 72 hours and the amount of inflammation seen on the CT scan and with the current COVID-19 pandemic situation I considered that the safest alternative if to treat the cholecystitis with IV antibiotics and if he does not respond to perform percutaneous cholecystostomy.  Agree with GI service to perform ERCP for the treatment of choledocholithiasis.  I discussed with the patient his alternatives and he agrees.  After the ERCP, if he responding to IV antibiotics he can continue with this treatment and performing interval cholecystectomy after resolution of acute cholecystitis.  If he does not respond to IV antibiotics he will need percutaneous cholecystostomy for the treatment of cholecystitis and then will have an interval appendectomy in 6 to 8 weeks after resolution of the cholecystitis.  I will continue to follow patient to help with management of cholecystitis and decision making about continued antibiotics versus cholecystostomy.  Will follow labs, vital signs and physical exams.    Arnold Long, MD

## 2018-05-15 NOTE — ED Triage Notes (Signed)
Pt reports pain across upper abd for a couple of days and some NV. Denies fevers, SOB or other upper resp symptoms.

## 2018-05-15 NOTE — Progress Notes (Signed)
Pt oxygen was 85% room air. Placed on 2L nasal canula, oxygen at 95%.Will continue to monitor.

## 2018-05-15 NOTE — Consult Note (Addendum)
[name] , MD [address] [place], [place], [place], [address] [address], [place], [place], [place], [address] Phone: [phone]  Fax: [phone]  Consultation  Referring Provider:   ER Primary Care Physician:  [name], MD Primary Gastroenterologist:  Dr. Tiffany Kocher          Reason for Consultation:     ERCP  Date of Admission:  05/15/2018 Date of Consultation:  05/15/2018         HPI:   Brent [name]. is a 73 y.o. male presented to the ER with abdominal pain , last 2 days. 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 He says the RUQ pain began a few days back and gradually got worse and hence came into the hospital. Denies any fevers,chills or rigors. No alcohol or smoking./ Not on any blood thinners.   CT scan of the abdomen showed acute cholecystitis and choledocholithiasis . T bil 1.9     Past Medical History:  Diagnosis Date   Diabetes mellitus without complication ([place])    Hyperlipidemia    Hypertension     Past Surgical History:  Procedure Laterality Date   CARPAL TUNNEL RELEASE Left 10/2015   COLONOSCOPY     COLONOSCOPY WITH PROPOFOL N/A 08/05/2017   Procedure: COLONOSCOPY WITH PROPOFOL;  Surgeon: Manya Silvas, MD;  Location: [facility] ENDOSCOPY;  Service: Endoscopy;  Laterality: N/A;   SPLENECTOMY      Prior to Admission medications   Medication Sig Start Date End Date Taking? Authorizing Provider  aspirin EC 81 MG tablet Take 81 mg by mouth daily.    Yes [provider]  atorvastatin (LIPITOR) 40 MG tablet Take 40 mg by mouth daily.   Yes [provider]  hydrochlorothiazide (HYDRODIURIL) 12.5 MG tablet Take 1 tablet by mouth daily. 06/30/15  Yes [provider]  lisinopril (PRINIVIL,ZESTRIL) 40 MG tablet Take 1 tablet by mouth daily. 05/01/15  Yes [provider]  metFORMIN (GLUCOPHAGE) 500 MG tablet Take 500 mg by mouth daily with breakfast. 02/28/18  Yes [provider]    ketoconazole (NIZORAL) 2 % cream Apply 1 application topically at bedtime. 01/27/18   [provider]  loratadine (CLARITIN) 10 MG tablet Take 10 mg by mouth daily as needed for allergies.    [provider]  tadalafil (CIALIS) 10 MG tablet Take 10 mg by mouth as directed. 04/10/18   [provider]  triamcinolone cream (KENALOG) 0.5 % Apply 1 application topically 2 (two) times daily.    [provider]    No family history on file.   Social History   Tobacco Use   Smoking status: Former Smoker   Smokeless tobacco: Never Used  Substance Use Topics   Alcohol use: Not Currently   Drug use: Never    Allergies as of 05/15/2018   (No Known Allergies)    Review of Systems:    All systems reviewed and negative except where noted in HPI.   Physical Exam:  Vital signs in last 24 hours: Temp:  [98.6 F (37 C)] 98.6 F (37 C) (04/09 0819) Pulse Rate:  [75-99] 75 (04/09 0900) Resp:  [20-28] 20 (04/09 0900) BP: (132-162)/(69-92) 132/69 (04/09 0900) SpO2:  [84 %-98 %] 92 % (04/09 0900) Weight:  [104.3 kg] 104.3 kg (04/09 0806)   General:   Pleasant, cooperative in NAD Head:  Normocephalic and atraumatic. Eyes:   No icterus.   Conjunctiva pink. PERRLA. Ears:  Normal auditory acuity. Neck:  Supple; no masses or thyroidomegaly Lungs: Respirations even and unlabored. Lungs clear to auscultation bilaterally.   No wheezes, crackles, or rhonchi.  Heart:  Regular rate and rhythm;  Without murmur, clicks, rubs or gallops Abdomen:  Soft, nondistended, mild RUQ tenderness. Normal bowel sounds. No appreciable masses or hepatomegaly.  No rebound or guarding.  Neurologic:  Alert and oriented x3;  grossly normal neurologically. Skin:  Intact without significant lesions or rashes. Cervical Nodes:  No significant cervical adenopathy. Psych:  Alert and cooperative. Normal affect.  LAB RESULTS: Recent Labs    05/15/18 0818  WBC 44.1*  HGB 16.2  HCT 47.5   PLT 501*   BMET Recent Labs    05/15/18 0818  NA 134*  K 4.1  CL 98  CO2 20*  GLUCOSE 342*  BUN 29*  CREATININE 1.24  CALCIUM 9.7   LFT Recent Labs    05/15/18 0818  PROT 8.7*  ALBUMIN 4.2  AST 25  ALT 15  ALKPHOS 78  BILITOT 1.9*   PT/INR No results for input(s): LABPROT, INR in the last 72 hours.  STUDIES: Ct Abdomen Pelvis W Contrast  Result Date: 05/15/2018 CLINICAL DATA:  73 year old male with abdominal pain EXAM: CT ABDOMEN AND PELVIS WITH CONTRAST TECHNIQUE: Multidetector CT imaging of the abdomen and pelvis was performed using the standard protocol following bolus administration of intravenous contrast. CONTRAST:  136mL OMNIPAQUE IOHEXOL 300 MG/ML  SOLN COMPARISON:  None. FINDINGS: Lower chest: Atelectatic changes at the lung bases with no acute finding. Hepatobiliary: Unremarkable liver. Gallbladder is distended with layered mixed density material at the gallbladder neck. Hyperdense material within the cystic duct, and at the distal common bile duct above the ampulla. No intrahepatic biliary ductal dilatation. Inflammatory changes within the fat adjacent to the gallbladder with trace fluid at the fascial planes. Pancreas: Unremarkable pancreas Spleen: Small volume residual splenic tissue, potentially from prior resection/splenectomy with possible splenosis/splenule is a remaining. Adrenals/Urinary Tract: Unremarkable adrenal glands. Bilateral kidneys unremarkable without hydronephrosis or nephrolithiasis. Unremarkable course of the ureters. Urinary bladder relatively decompressed. Stomach/Bowel: Unremarkable stomach. Small hiatal hernia. Inflammatory changes adjacent to the duodenum are likely secondary to the inflamed gallbladder. Mild dilation of small bowel with air-fluid levels and no evidence of transition. Normal appendix. Mild stool burden. Diverticular change without evidence of acute diverticulitis. Vascular/Lymphatic: Atherosclerotic changes of the abdominal aorta.  Mesenteric and renal arteries are patent. Bilateral iliac arteries and bilateral proximal femoral arteries are patent. Unremarkable venous system. Reproductive: Unremarkable appearance of the pelvic structures. Other: .  Trace free fluid within the pelvis. Musculoskeletal: No displaced fracture. 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 Advanced disc disease at L5-S1. More subtle disc disease throughout the remainder of the lumbar levels. Degenerative changes of the hips. IMPRESSION: CT findings of acute cholecystitis, with retained choledocholithiasis of the common bile duct. Referral for surgical evaluation is indicated. These results were discussed by telephone at the time of interpretation on 05/15/2018 at 9:28 am with Dr. Carrie Mew. Aortic Atherosclerosis (ICD10-I70.0). Additional ancillary findings as above. Electronically Signed   By: Corrie Mckusick D.O.   On: 05/15/2018 09:30      Impression / Plan:   Brent Paulos. is a 73 y.o. y/o male presented to the ER with abdominal pain and found to have acute cholecystitis and choledocholithiasis.   Plan  1. ERCP tomorrow 2. Acute cholecystitis - surgery consult recommended for timing of cholecystectomy on antibiotics   I have discussed alternative options,  risks & benefits,  which include, but are not limited to, bleeding, infection, perforation,pancreatitis, respiratory complication drug reaction &death  The patient agrees with this plan & written consent will be obtained.     Thank you for involving me in the care of this patient.      LOS: 0 days   [name], MD  05/15/2018, 10:19 AM

## 2018-05-15 NOTE — ED Notes (Signed)
ED TO INPATIENT HANDOFF REPORT  ED Nurse Name and Phone #: Janett Billow [phone]   Q Name/Age/Gender Brent [name]. 73 y.o. male Room/Bed: ED12A/ED12A  Code Status   Code Status: Full Code  Home/SNF/Other Home Patient oriented to: self, place, time and situation Is this baseline? Yes   Triage Complete: Triage complete  Chief Complaint upper abdominal Pain  Triage Note Pt reports pain across upper abd for a couple of days and some NV. Denies fevers, SOB or other upper resp symptoms.    Allergies No Known Allergies  Level of Care/Admitting Diagnosis ED Disposition    ED Disposition Condition [facility] Area: [place] [[id number]]  Level of Care: Med-Surg [16]  Diagnosis: Choledocholithiasis [[id number]]  Admitting Physician: Hillary Bow [[id number]]  Attending Physician: Hillary Bow [[id number]]  Estimated length of stay: past midnight tomorrow  Certification:: I certify this patient will need inpatient services for at least 2 midnights  PT Class (Do Not Modify): Inpatient [101]  PT Acc Code (Do Not Modify): Private [1]       B Medical/Surgery History Past Medical History:  Diagnosis Date  . Diabetes mellitus without complication ([place])   . Hyperlipidemia   . Hypertension    Past Surgical History:  Procedure Laterality Date  . CARPAL TUNNEL RELEASE Left 10/2015  . COLONOSCOPY    . COLONOSCOPY WITH PROPOFOL N/A 08/05/2017   Procedure: COLONOSCOPY WITH PROPOFOL;  Surgeon: Manya Silvas, MD;  Location: [facility] ENDOSCOPY;  Service: Endoscopy;  Laterality: N/A;  . SPLENECTOMY       A IV Location/Drains/Wounds Patient Lines/Drains/Airways Status   Active Line/Drains/Airways    Name:   Placement date:   Placement time:   Site:   Days:   Peripheral IV 05/15/18 Left Forearm   05/15/18    0829    Forearm   less than 1          Intake/Output Last 24 hours No intake or output data in the 24 hours ending 05/15/18  1119  Labs/Imaging Results for orders placed or performed during the hospital encounter of 05/15/18 (from the past 48 hour(s))  Lipase, blood     Status: None   Collection Time: 05/15/18  8:18 AM  Result Value Ref Range   Lipase 24 11 - 51 U/L    Comment: Performed at [facility], [address] [place], [place] [address]  Comprehensive metabolic panel     Status: Abnormal   Collection Time: 05/15/18  8:18 AM  Result Value Ref Range   Sodium 134 (L) 135 - 145 mmol/L   Potassium 4.1 3.5 - 5.1 mmol/L   Chloride 98 98 - 111 mmol/L   CO2 20 (L) 22 - 32 mmol/L   Glucose, Bld 342 (H) 70 - 99 mg/dL   BUN 29 (H) 8 - 23 mg/dL   Creatinine, Ser 1.24 0.61 - 1.24 mg/dL   Calcium 9.7 8.9 - 10.3 mg/dL   Total Protein 8.7 (H) 6.5 - 8.1 g/dL   Albumin 4.2 3.5 - 5.0 g/dL   AST 25 15 - 41 U/L   ALT 15 0 - 44 U/L   Alkaline Phosphatase 78 38 - 126 U/L   Total Bilirubin 1.9 (H) 0.3 - 1.2 mg/dL   GFR calc non Af Amer 58 (L) >60 mL/min   GFR calc Af Amer >60 >60 mL/min   Anion gap 16 (H) 5 - 15    Comment: Performed at [facility], [address] [place], [place] [address]  CBC with Differential     Status: Abnormal   Collection Time: 05/15/18  8:18 AM  Result Value Ref Range   WBC 44.1 (H) 4.0 - 10.5 K/uL   RBC 5.36 4.22 - 5.81 MIL/uL   Hemoglobin 16.2 13.0 - 17.0 g/dL   HCT 47.5 39.0 - 52.0 %   MCV 88.6 80.0 - 100.0 fL   MCH 30.2 26.0 - 34.0 pg   MCHC 34.1 30.0 - 36.0 g/dL   RDW 12.9 11.5 - 15.5 %   Platelets 501 (H) 150 - 400 K/uL   nRBC 0.0 0.0 - 0.2 %   Neutrophils Relative % 86 %   Neutro Abs 37.7 (H) 1.7 - 7.7 K/uL   Lymphocytes Relative 3 %   Lymphs Abs 1.3 0.7 - 4.0 K/uL   Monocytes Relative 10 %   Monocytes Absolute 4.4 (H) 0.1 - 1.0 K/uL   Eosinophils Relative 0 %   Eosinophils Absolute 0.0 0.0 - 0.5 K/uL   Basophils Relative 0 %   Basophils Absolute 0.1 0.0 - 0.1 K/uL   WBC Morphology MORPHOLOGY UNREMARKABLE    RBC Morphology MORPHOLOGY UNREMARKABLE     Smear Review Normal platelet morphology    Immature Granulocytes 1 %   Abs Immature Granulocytes 0.47 (H) 0.00 - 0.07 K/uL    Comment: Performed at [facility], [address] [place], [place] [address]   Ct Abdomen Pelvis W Contrast  Result Date: 05/15/2018 CLINICAL DATA:  73 year old male with abdominal pain EXAM: CT ABDOMEN AND PELVIS WITH CONTRAST TECHNIQUE: Multidetector CT imaging of the abdomen and pelvis was performed using the standard protocol following bolus administration of intravenous contrast. CONTRAST:  172mL OMNIPAQUE IOHEXOL 300 MG/ML  SOLN COMPARISON:  None. FINDINGS: Lower chest: Atelectatic changes at the lung bases with no acute finding. Hepatobiliary: Unremarkable liver. Gallbladder is distended with layered mixed density material at the gallbladder neck. Hyperdense material within the cystic duct, and at the distal common bile duct above the ampulla. No intrahepatic biliary ductal dilatation. Inflammatory changes within the fat adjacent to the gallbladder with trace fluid at the fascial planes. Pancreas: Unremarkable pancreas Spleen: Small volume residual splenic tissue, potentially from prior resection/splenectomy with possible splenosis/splenule is a remaining. Adrenals/Urinary Tract: Unremarkable adrenal glands. Bilateral kidneys unremarkable without hydronephrosis or nephrolithiasis. Unremarkable course of the ureters. Urinary bladder relatively decompressed. Stomach/Bowel: Unremarkable stomach. Small hiatal hernia. Inflammatory changes adjacent to the duodenum are likely secondary to the inflamed gallbladder. Mild dilation of small bowel with air-fluid levels and no evidence of transition. Normal appendix. Mild stool burden. Diverticular change without evidence of acute diverticulitis. Vascular/Lymphatic: Atherosclerotic changes of the abdominal aorta. Mesenteric and renal arteries are patent. Bilateral iliac arteries and bilateral proximal femoral arteries are  patent. Unremarkable venous system. Reproductive: Unremarkable appearance of the pelvic structures. Other: .  Trace free fluid within the pelvis. Musculoskeletal: No displaced fracture. 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 Advanced disc disease at L5-S1. More subtle disc disease throughout the remainder of the lumbar levels. Degenerative changes of the hips. IMPRESSION: CT findings of acute cholecystitis, with retained choledocholithiasis of the common bile duct. Referral for surgical evaluation is indicated. These results were discussed by telephone at the time of interpretation on 05/15/2018 at 9:28 am with Dr. Carrie Mew. Aortic Atherosclerosis (ICD10-I70.0). Additional ancillary findings as above. Electronically Signed   By: Corrie Mckusick D.O.   On: 05/15/2018 09:30    Pending Labs Unresulted  Labs (From admission, onward)    Start     Ordered   05/22/18 0500  Creatinine, serum  (enoxaparin (LOVENOX)    CrCl >/= 30 ml/min)  Weekly,   STAT    Comments:  while on enoxaparin therapy    05/15/18 0958   05/16/18 0500  Comprehensive metabolic panel  Tomorrow morning,   STAT     05/15/18 0958   05/16/18 0500  CBC  Tomorrow morning,   STAT     05/15/18 0958   05/16/18 0500  Protime-INR  Tomorrow morning,   STAT     05/15/18 0958   05/15/18 1025  CULTURE, BLOOD (ROUTINE X 2) w Reflex to ID Panel  BLOOD CULTURE X 2,   STAT     05/15/18 1024   05/15/18 0807  Urinalysis, Complete w Microscopic  ONCE - STAT,   STAT     05/15/18 0807          Vitals/Pain Today's Vitals   05/15/18 0830 05/15/18 0845 05/15/18 0900 05/15/18 0924  BP: (!) 142/69  132/69   Pulse: 84 96 75   Resp: (!) 28 (!) 24 20   Temp:      TempSrc:      SpO2: 95% (!) 84% 92%   Weight:      Height:      PainSc:    3     Isolation Precautions No active isolations  Medications Medications  enoxaparin (LOVENOX) injection 40 mg (has no administration in time range)  sodium chloride flush  (NS) 0.9 % injection 3 mL (3 mLs Intravenous Given 05/15/18 1007)  acetaminophen (TYLENOL) tablet 650 mg (has no administration in time range)    Or  acetaminophen (TYLENOL) suppository 650 mg (has no administration in time range)  polyethylene glycol (MIRALAX / GLYCOLAX) packet 17 g (has no administration in time range)  HYDROcodone-acetaminophen (NORCO/VICODIN) 5-325 MG per tablet 1-2 tablet (has no administration in time range)  ondansetron (ZOFRAN) tablet 4 mg (has no administration in time range)    Or  ondansetron (ZOFRAN) injection 4 mg (has no administration in time range)  albuterol (PROVENTIL) (2.5 MG/3ML) 0.083% nebulizer solution 2.5 mg (has no administration in time range)  atorvastatin (LIPITOR) tablet 40 mg (has no administration in time range)  tadalafil (ADCIRCA/CIALIS) tablet 10 mg (has no administration in time range)  lisinopril (PRINIVIL,ZESTRIL) tablet 40 mg (has no administration in time range)  lactated ringers infusion (has no administration in time range)  insulin aspart (novoLOG) injection 0-9 Units (has no administration in time range)  sodium chloride 0.9 % bolus 1,000 mL (has no administration in time range)  Ampicillin-Sulbactam (UNASYN) 3 g in sodium chloride 0.9 % 100 mL IVPB (has no administration in time range)  morphine 2 MG/ML injection 2 mg (has no administration in time range)  HYDROmorphone HCl (DILAUDID) liquid 1 mg (has no administration in time range)  sodium chloride 0.9 % bolus 1,000 mL (1,000 mLs Intravenous New Bag/Given 05/15/18 0831)  ondansetron (ZOFRAN) injection 4 mg (4 mg Intravenous Given 05/15/18 0831)  HYDROmorphone (DILAUDID) injection 1 mg (1 mg Intravenous Given 05/15/18 1106)  iohexol (OMNIPAQUE) 300 MG/ML solution 100 mL (100 mLs Intravenous Contrast Given 05/15/18 0912)  piperacillin-tazobactam (ZOSYN) IVPB 3.375 g (0 g Intravenous Stopped 05/15/18 1007)    Mobility walks Low fall risk   Focused Assessments 1   R Recommendations:  See Admitting Provider Note  Report given to:   Additional Notes:

## 2018-05-15 NOTE — H&P (Signed)
[facility] [place] NAME: Brent [name]    MR#:  [id number]  DATE OF BIRTH:  12-31-1945  DATE OF ADMISSION:  05/15/2018  PRIMARY CARE PHYSICIAN: [name], MD   REQUESTING/REFERRING PHYSICIAN: Dr. Joni Fears  CHIEF COMPLAINT:   Chief Complaint  Patient presents with  . Abdominal Pain  . Emesis    HISTORY OF PRESENT ILLNESS:  Brent [name]  is a 73 y.o. male with a known history of hypertension, hyperlipidemia, type 2 diabetes mellitus, obesity presented to the hospital complaining of 2 days of upper abdominal pain and vomiting.  No blood in the vomiting.  No diarrhea.  Afebrile.  No respiratory symptoms.  Here patient was found to have hyperbilirubinemia with white count of 44,000.  A CT scan of the abdomen and pelvis was done which shows acute cholecystitis with choledocholithiasis.  Patient is being admitted for cholecystitis and cholangitis.  PAST MEDICAL HISTORY:   Past Medical History:  Diagnosis Date  . Diabetes mellitus without complication ([place])   . Hyperlipidemia   . Hypertension     PAST SURGICAL HISTORY:   Past Surgical History:  Procedure Laterality Date  . CARPAL TUNNEL RELEASE Left 10/2015  . COLONOSCOPY    . COLONOSCOPY WITH PROPOFOL N/A 08/05/2017   Procedure: COLONOSCOPY WITH PROPOFOL;  Surgeon: Manya Silvas, MD;  Location: [facility] ENDOSCOPY;  Service: Endoscopy;  Laterality: N/A;  . SPLENECTOMY      SOCIAL HISTORY:   Social History   Tobacco Use  . Smoking status: Former Research scientist (life sciences)  . Smokeless tobacco: Never Used  Substance Use Topics  . Alcohol use: Not Currently    FAMILY HISTORY:   Father with diabetes and heart disease DRUG ALLERGIES:  No Known Allergies  REVIEW OF SYSTEMS:   Review of Systems  Constitutional: Positive for malaise/fatigue. Negative for chills and fever.  HENT: Negative for sore throat.   Eyes: Negative for blurred vision, double vision and pain.  Respiratory: Negative for  cough, hemoptysis, shortness of breath and wheezing.   Cardiovascular: Negative for chest pain, palpitations, orthopnea and leg swelling.  Gastrointestinal: Positive for abdominal pain, nausea and vomiting. Negative for constipation, diarrhea and heartburn.  Genitourinary: Negative for dysuria and hematuria.  Musculoskeletal: Negative for back pain and joint pain.  Skin: Negative for rash.  Neurological: Negative for sensory change, speech change, focal weakness and headaches.  Endo/Heme/Allergies: Does not bruise/bleed easily.  Psychiatric/Behavioral: Negative for depression. The patient is not nervous/anxious.     MEDICATIONS AT HOME:   Prior to Admission medications   Medication Sig Start Date End Date Taking? Authorizing Provider  aspirin EC 81 MG tablet Take 81 mg by mouth daily.    Yes [provider]  atorvastatin (LIPITOR) 40 MG tablet Take 40 mg by mouth daily.   Yes [provider]  hydrochlorothiazide (HYDRODIURIL) 12.5 MG tablet Take 1 tablet by mouth daily. 06/30/15  Yes [provider]  lisinopril (PRINIVIL,ZESTRIL) 40 MG tablet Take 1 tablet by mouth daily. 05/01/15  Yes [provider]  metFORMIN (GLUCOPHAGE) 500 MG tablet Take 500 mg by mouth daily with breakfast. 02/28/18  Yes [provider]  ketoconazole (NIZORAL) 2 % cream Apply 1 application topically at bedtime. 01/27/18   [provider]  loratadine (CLARITIN) 10 MG tablet Take 10 mg by mouth daily as needed for allergies.    [provider]  tadalafil (CIALIS) 10 MG tablet Take 10 mg by mouth as directed. 04/10/18   [provider]  triamcinolone cream (KENALOG) 0.5 % Apply 1 application topically 2 (two) times daily.    [provider]     VITAL SIGNS:  Blood pressure 132/69, pulse 75, temperature 98.6 F (37 C), temperature source Oral, resp. rate 20, height 5\' 8"  (1.727 m), weight 104.3 kg, SpO2 92 %.  PHYSICAL EXAMINATION:  Physical  Exam  GENERAL:  73 y.o.-year-old patient lying in the bed.  Obese EYES: Pupils equal, round, reactive to light and accommodation. No scleral icterus. Extraocular muscles intact.  HEENT: Head atraumatic, normocephalic. Oropharynx and nasopharynx clear. No oropharyngeal erythema, moist oral mucosa  NECK:  Supple, no jugular venous distention. No thyroid enlargement, no tenderness.  LUNGS: Normal breath sounds bilaterally, no wheezing, rales, rhonchi. No use of accessory muscles of respiration.  CARDIOVASCULAR: S1, S2 normal. No murmurs, rubs, or gallops.  ABDOMEN: Soft, tenderness in the right upper quadrant and epigastric area., nondistended. Bowel sounds present. No organomegaly or mass.  EXTREMITIES: No pedal edema, cyanosis, or clubbing. + 2 pedal & radial pulses b/l.   NEUROLOGIC: Cranial nerves II through XII are intact. No focal Motor or sensory deficits appreciated b/l PSYCHIATRIC: The patient is alert and oriented x 3. Good affect.  SKIN: No obvious rash, lesion, or ulcer.   LABORATORY PANEL:   CBC Recent Labs  Lab 05/15/18 0818  WBC 44.1*  HGB 16.2  HCT 47.5  PLT 501*   ------------------------------------------------------------------------------------------------------------------  Chemistries  Recent Labs  Lab 05/15/18 0818  NA 134*  K 4.1  CL 98  CO2 20*  GLUCOSE 342*  BUN 29*  CREATININE 1.24  CALCIUM 9.7  AST 25  ALT 15  ALKPHOS 78  BILITOT 1.9*   ------------------------------------------------------------------------------------------------------------------  Cardiac Enzymes No results for input(s): TROPONINI in the last 168 hours. ------------------------------------------------------------------------------------------------------------------  RADIOLOGY:  Ct Abdomen Pelvis W Contrast  Result Date: 05/15/2018 CLINICAL DATA:  73 year old male with abdominal pain EXAM: CT ABDOMEN AND PELVIS WITH CONTRAST TECHNIQUE: Multidetector CT imaging of the  abdomen and pelvis was performed using the standard protocol following bolus administration of intravenous contrast. CONTRAST:  165mL OMNIPAQUE IOHEXOL 300 MG/ML  SOLN COMPARISON:  None. FINDINGS: Lower chest: Atelectatic changes at the lung bases with no acute finding. Hepatobiliary: Unremarkable liver. Gallbladder is distended with layered mixed density material at the gallbladder neck. Hyperdense material within the cystic duct, and at the distal common bile duct above the ampulla. No intrahepatic biliary ductal dilatation. Inflammatory changes within the fat adjacent to the gallbladder with trace fluid at the fascial planes. Pancreas: Unremarkable pancreas Spleen: Small volume residual splenic tissue, potentially from prior resection/splenectomy with possible splenosis/splenule is a remaining. Adrenals/Urinary Tract: Unremarkable adrenal glands. Bilateral kidneys unremarkable without hydronephrosis or nephrolithiasis. Unremarkable course of the ureters. Urinary bladder relatively decompressed. Stomach/Bowel: Unremarkable stomach. Small hiatal hernia. Inflammatory changes adjacent to the duodenum are likely secondary to the inflamed gallbladder. Mild dilation of small bowel with air-fluid levels and no evidence of transition. Normal appendix. Mild stool burden. Diverticular change without evidence of acute diverticulitis. Vascular/Lymphatic: Atherosclerotic changes of the abdominal aorta. Mesenteric and renal arteries are patent. Bilateral iliac arteries and bilateral proximal femoral arteries are patent. Unremarkable venous system. Reproductive: Unremarkable appearance of the pelvic structures. Other: .  Trace free fluid within the pelvis. Musculoskeletal: No displaced fracture. 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 Advanced disc disease at L5-S1. More subtle disc disease throughout the remainder of the lumbar levels. Degenerative changes of the hips. IMPRESSION: CT findings of  acute  cholecystitis, with retained choledocholithiasis of the common bile duct. Referral for surgical evaluation is indicated. These results were discussed by telephone at the time of interpretation on 05/15/2018 at 9:28 am with Dr. Carrie Mew. Aortic Atherosclerosis (ICD10-I70.0). Additional ancillary findings as above. Electronically Signed   By: Corrie Mckusick D.O.   On: 05/15/2018 09:30     IMPRESSION AND PLAN:   *Acute cholangitis and cholecystitis with sepsis present on admission We will bolus normal saline stat.  Check lactic acid.  Send blood cultures.  Start Unasyn.  Consult GI and surgery.  Dr. Peyton Najjar and Vicente Males contacted.  Patient will need ERCP and cholecystectomy. N.p.o. except medications.  IV fluids. Repeat CMP in the morning  *Hypertension.  Will resume lisinopril.  Hold hydrochlorothiazide.  *Diabetes mellitus.  Sliding scale insulin.  Hold metformin.  DVT prophylaxis with Lovenox  All the records are reviewed and case discussed with ED provider. Management plans discussed with the patient, family and they are in agreement.  CODE STATUS: Full code  TOTAL CRITICAL CARE TIME TAKING CARE OF THIS PATIENT: 40 minutes.   Neita Carp M.D on 05/15/2018 at 10:28 AM  Between 7am to 6pm - Pager - [phone]  After 6pm go to [email or web address] - password [email or web address] [facility]  Office  [phone]  CC: Primary care physician; [name], MD  Note: This dictation was prepared with Dragon dictation along with smaller phrase technology. Any transcriptional errors that result from this process are unintentional.

## 2018-05-15 NOTE — ED Notes (Signed)
Unable to reassess pain, pt in CT

## 2018-05-15 NOTE — Consult Note (Signed)
Pharmacy Antibiotic Note  Chanc Brent [name]. is a 73 y.o. male admitted on 05/15/2018 with intra-abdominal infection.  Pharmacy has been consulted for Unasyn dosing.  Plan: Unasyn IV 3g q6h  Height: 5\' 8"  (172.7 cm) Weight: 230 lb (104.3 kg) IBW/kg (Calculated) : 68.4  Temp (24hrs), Avg:98.6 F (37 C), Min:98.6 F (37 C), Max:98.6 F (37 C)  Recent Labs  Lab 05/15/18 0818  WBC 44.1*  CREATININE 1.24    Estimated Creatinine Clearance: 64 mL/min (by C-G formula based on SCr of 1.24 mg/dL).    No Known Allergies  Antimicrobials this admission: Zosyn 4/9 x 1 Unasyn 4/9 >>   Dose adjustments this admission: None  Microbiology results: 4/9 BCx: pending  Thank you for allowing pharmacy to be a part of this patient's care.  Lu Duffel, PharmD, BCPS Clinical Pharmacist 05/15/2018 10:41 AM

## 2018-05-15 NOTE — ED Provider Notes (Signed)
[facility] Emergency Department Provider Note  ____________________________________________  Time seen: Approximately 9:58 AM  I have reviewed the triage vital signs and the nursing notes.   HISTORY  Chief Complaint Abdominal Pain and Emesis    HPI Brent Weihe. is a 73 y.o. male with a history of hypertension hyperlipidemia diabetes who complains of upper abdominal pain for the past 2 days, gradual onset, constant, worsening and severe.  Nonradiating, no aggravating or alleviating factors.  He notes that over the past 2 days has been unable to eat or drink anything and vomits whenever he tries.  Last bowel movement was 3 days ago.   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  Spherocytosis sounds familiar to him.   Past Medical History:  Diagnosis Date  . Diabetes mellitus without complication ([place])   . Hyperlipidemia   . Hypertension      There are no active problems to display for this patient.    Past Surgical History:  Procedure Laterality Date  . CARPAL TUNNEL RELEASE Left 10/2015  . COLONOSCOPY    . COLONOSCOPY WITH PROPOFOL N/A 08/05/2017   Procedure: COLONOSCOPY WITH PROPOFOL;  Surgeon: Manya Silvas, MD;  Location: [facility] ENDOSCOPY;  Service: Endoscopy;  Laterality: N/A;  . SPLENECTOMY       Prior to Admission medications   Medication Sig Start Date End Date Taking? Authorizing Provider  aspirin EC 81 MG tablet Take 81 mg by mouth daily.    Yes [provider]  atorvastatin (LIPITOR) 40 MG tablet Take 40 mg by mouth daily.   Yes [provider]  hydrochlorothiazide (HYDRODIURIL) 12.5 MG tablet Take 1 tablet by mouth daily. 06/30/15  Yes [provider]  lisinopril (PRINIVIL,ZESTRIL) 40 MG tablet Take 1 tablet by mouth daily. 05/01/15  Yes [provider]  metFORMIN (GLUCOPHAGE) 500 MG tablet Take 500 mg by mouth daily with breakfast. 02/28/18  Yes [provider]  ketoconazole (NIZORAL) 2 % cream Apply 1 application topically at bedtime. 01/27/18   [provider]  loratadine (CLARITIN) 10 MG tablet Take 10 mg by mouth daily as needed for allergies.    [provider]  tadalafil (CIALIS) 10 MG tablet Take 10 mg by mouth as directed. 04/10/18   [provider]  triamcinolone cream (KENALOG) 0.5 % Apply 1 application topically 2 (two) times daily.    [provider]     Allergies Patient has no known allergies.   No family history on file.  Social History Social History   Tobacco Use  . Smoking status: Former Research scientist (life sciences)  . Smokeless tobacco: Never Used  Substance Use Topics  . Alcohol use: Not Currently  . Drug use: Never    Review of Systems  Constitutional:   No fever or chills.  ENT:   No sore throat. No rhinorrhea. Cardiovascular:   No chest pain or syncope. Respiratory:   No dyspnea or cough. Gastrointestinal: Positive as above for abdominal pain and vomiting Musculoskeletal:   Negative for focal pain or swelling All other systems reviewed and are negative except as documented above in ROS and HPI.  ____________________________________________   PHYSICAL EXAM:  VITAL SIGNS: ED Triage Vitals  Enc Vitals Group     BP 05/15/18 0814 (!) 162/92     Pulse Rate 05/15/18 0814 99     Resp 05/15/18 0814 20     Temp 05/15/18 0819 98.6 F (37 C)  Temp Source 05/15/18 0819 Oral     SpO2 05/15/18 0814 98 %     Weight 05/15/18 0806 230 lb (104.3 kg)     Height 05/15/18 0806 5\' 8"  (1.727 m)     Head Circumference --      Peak Flow --      Pain Score 05/15/18 0805 8     Pain Loc --      Pain Edu? --      Excl. in [place]? --     Vital signs reviewed, nursing assessments reviewed.   Constitutional:   Alert and oriented. Non-toxic appearance. Eyes:   Conjunctivae are normal. EOMI. PERRL. ENT      Head:   Normocephalic and atraumatic.      Nose:   No congestion/rhinnorhea.        Mouth/Throat:   Dry mucous membranes, no pharyngeal erythema. No peritonsillar mass.       Neck:   No meningismus. Full ROM. Hematological/Lymphatic/Immunilogical:   No cervical lymphadenopathy. Cardiovascular:   RRR. Symmetric bilateral radial and DP pulses.  No murmurs. Cap refill less than 2 seconds. Respiratory:   Normal respiratory effort without tachypnea/retractions. Breath sounds are clear and equal bilaterally. No wheezes/rales/rhonchi. Gastrointestinal:   Soft with diffuse right-sided tenderness, exquisite at the right upper quadrant.  Moderately distended.. There is no CVA tenderness.  No rebound, rigidity, or guarding. Musculoskeletal:   Normal range of motion in all extremities. No joint effusions.  No lower extremity tenderness.  No edema. Neurologic:   Normal speech and language.  Motor grossly intact. No acute focal neurologic deficits are appreciated.  Skin:    Skin is warm, dry and intact. No rash noted.  No petechiae, purpura, or bullae.  ____________________________________________    LABS (pertinent positives/negatives) (all labs ordered are listed, but only abnormal results are displayed) Labs Reviewed  COMPREHENSIVE METABOLIC PANEL - Abnormal; Notable for the following components:      Result Value   Sodium 134 (*)    CO2 20 (*)    Glucose, Bld 342 (*)    BUN 29 (*)    Total Protein 8.7 (*)    Total Bilirubin 1.9 (*)    GFR calc non Af Amer 58 (*)    Anion gap 16 (*)    All other components within normal limits  CBC WITH DIFFERENTIAL/PLATELET - Abnormal; Notable for the following components:   WBC 44.1 (*)    Platelets 501 (*)    Neutro Abs 37.7 (*)    Monocytes Absolute 4.4 (*)    Abs Immature Granulocytes 0.47 (*)    All other components within normal limits  LIPASE, BLOOD  URINALYSIS, COMPLETE (UACMP) WITH MICROSCOPIC   ____________________________________________   EKG  Interpreted by me Atrial fibrillation rate of 96.  Normal axis.  Right  bundle branch block.  No acute ischemic changes.  ____________________________________________    RADIOLOGY  Ct Abdomen Pelvis W Contrast  Result Date: 05/15/2018 CLINICAL DATA:  74 year old male with abdominal pain EXAM: CT ABDOMEN AND PELVIS WITH CONTRAST TECHNIQUE: Multidetector CT imaging of the abdomen and pelvis was performed using the standard protocol following bolus administration of intravenous contrast. CONTRAST:  1108mL OMNIPAQUE IOHEXOL 300 MG/ML  SOLN COMPARISON:  None. FINDINGS: Lower chest: Atelectatic changes at the lung bases with no acute finding. Hepatobiliary: Unremarkable liver. Gallbladder is distended with layered mixed density material at the gallbladder neck. Hyperdense material within the cystic duct, and at the distal common bile duct above the ampulla. No intrahepatic  biliary ductal dilatation. Inflammatory changes within the fat adjacent to the gallbladder with trace fluid at the fascial planes. Pancreas: Unremarkable pancreas Spleen: Small volume residual splenic tissue, potentially from prior resection/splenectomy with possible splenosis/splenule is a remaining. Adrenals/Urinary Tract: Unremarkable adrenal glands. Bilateral kidneys unremarkable without hydronephrosis or nephrolithiasis. Unremarkable course of the ureters. Urinary bladder relatively decompressed. Stomach/Bowel: Unremarkable stomach. Small hiatal hernia. Inflammatory changes adjacent to the duodenum are likely secondary to the inflamed gallbladder. Mild dilation of small bowel with air-fluid levels and no evidence of transition. Normal appendix. Mild stool burden. Diverticular change without evidence of acute diverticulitis. Vascular/Lymphatic: Atherosclerotic changes of the abdominal aorta. Mesenteric and renal arteries are patent. Bilateral iliac arteries and bilateral proximal femoral arteries are patent. Unremarkable venous system. Reproductive: Unremarkable appearance of the pelvic structures. Other: .   Trace free fluid within the pelvis. Musculoskeletal: No displaced fracture. 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 Advanced disc disease at L5-S1. More subtle disc disease throughout the remainder of the lumbar levels. Degenerative changes of the hips. IMPRESSION: CT findings of acute cholecystitis, with retained choledocholithiasis of the common bile duct. Referral for surgical evaluation is indicated. These results were discussed by telephone at the time of interpretation on 05/15/2018 at 9:28 am with Dr. Carrie Mew. Aortic Atherosclerosis (ICD10-I70.0). Additional ancillary findings as above. Electronically Signed   By: Corrie Mckusick D.O.   On: 05/15/2018 09:30    ____________________________________________   PROCEDURES Procedures  ____________________________________________  DIFFERENTIAL DIAGNOSIS   Cholecystitis, choledocholithiasis, pancreatitis, bowel obstruction, perforation, appendicitis, diverticulitis  CLINICAL IMPRESSION / ASSESSMENT AND PLAN / ED COURSE  Medications ordered in the ED: Medications  piperacillin-tazobactam (ZOSYN) IVPB 3.375 g (has no administration in time range)  sodium chloride 0.9 % bolus 1,000 mL (1,000 mLs Intravenous New Bag/Given 05/15/18 0831)  ondansetron (ZOFRAN) injection 4 mg (4 mg Intravenous Given 05/15/18 0831)  HYDROmorphone (DILAUDID) injection 1 mg (1 mg Intravenous Given 05/15/18 0832)  iohexol (OMNIPAQUE) 300 MG/ML solution 100 mL (100 mLs Intravenous Contrast Given 05/15/18 0912)    Pertinent labs & imaging results that were available during my care of the patient were reviewed by me and considered in my medical decision making (see chart for details).  Brent [name]. was evaluated in Emergency Department on 05/15/2018 for the symptoms described in the history of present illness. He was evaluated in the context of the global COVID-19 pandemic, which necessitated consideration that the patient might be at risk  for infection with the SARS-CoV-2 virus that causes COVID-19. Institutional protocols and algorithms that pertain to the evaluation of patients at risk for COVID-19 are in a state of rapid change based on information released by regulatory bodies including the CDC and federal and state organizations. These policies and algorithms were followed during the patient's care in the ED.     Clinical Course as of May 14 956  Thu May 15, 2018  0826 Patient presents with generalized abdominal pain, worse on the right.  Appears distended.  Severe right upper quadrant tenderness.  Worrisome for cholecystitis primarily, but differential is broad so I will obtain a CT scan in addition to labs.  He is tachypneic some worried about metabolic acidosis, doubt primary cardiopulmonary illness.  Symptom relief with IV fluid bolus, Dilaudid 1 mg IV, Zofran 4 mg IV.   [PS]  9242 CT result discussed with radiology who notes cholecystitis as well as choledocholithiasis.  Result discussed with general surgery who recommends GI evaluation first for  likely ERCP and bile duct decompression prior to cholecystectomy.  Agrees with antibiotics for now.  Patient updated.  White blood cell count 44,000 consistent with his acute infectious process and the metabolic acidosis.  Vital signs remain normal, patient is not septic despite the lab abnormalities.   [PS]    Clinical Course User Index [PS] Carrie Mew, MD    ----------------------------------------- 10:13 AM on 05/15/2018 -----------------------------------------  Discussed with Dr. and of GI.  Will evaluate for ERCP.  Discussed with hospitalist.   ____________________________________________   FINAL CLINICAL IMPRESSION(S) / ED DIAGNOSES    Final diagnoses:  Choledocholithiasis with cholecystitis  Metabolic acidosis  Leukocytosis, unspecified type     ED Discharge Orders    None      Portions of this note were generated with dragon dictation  software. Dictation errors may occur despite best attempts at proofreading.   Carrie Mew, MD 05/15/18 1014

## 2018-05-15 NOTE — ED Notes (Signed)
Pt reports midline stomach pain that started 2 days ago, sometimes pain radiates to the right side of the abdomen. Bed locked and in lowest position, call bell in reach

## 2018-05-16 ENCOUNTER — Inpatient Hospital Stay: Payer: Medicare Other

## 2018-05-16 ENCOUNTER — Inpatient Hospital Stay: Payer: Medicare Other | Admitting: Registered Nurse

## 2018-05-16 ENCOUNTER — Encounter
Admission: EM | Disposition: A | Discharge status: Home Health | Payer: Self-pay | Source: Home / Self Care | Attending: Internal Medicine

## 2018-05-16 DIAGNOSIS — K269 Duodenal ulcer, unspecified as acute or chronic, without hemorrhage or perforation: Secondary | ICD-10-CM

## 2018-05-16 DIAGNOSIS — R748 Abnormal levels of other serum enzymes: Secondary | ICD-10-CM

## 2018-05-16 DIAGNOSIS — R935 Abnormal findings on diagnostic imaging of other abdominal regions, including retroperitoneum: Secondary | ICD-10-CM

## 2018-05-16 HISTORY — PX: ENDOSCOPIC RETROGRADE CHOLANGIOPANCREATOGRAPHY (ERCP) WITH PROPOFOL: SHX5810

## 2018-05-16 LAB — COMPREHENSIVE METABOLIC PANEL
ALT: 16 U/L (ref 0–44)
AST: 26 U/L (ref 15–41)
Albumin: 3.6 g/dL (ref 3.5–5.0)
Alkaline Phosphatase: 61 U/L (ref 38–126)
Anion gap: 10 (ref 5–15)
BUN: 37 mg/dL — ABNORMAL HIGH (ref 8–23)
CO2: 23 mmol/L (ref 22–32)
Calcium: 8.8 mg/dL — ABNORMAL LOW (ref 8.9–10.3)
Chloride: 105 mmol/L (ref 98–111)
Creatinine, Ser: 1.48 mg/dL — ABNORMAL HIGH (ref 0.61–1.24)
GFR calc Af Amer: 54 mL/min — ABNORMAL LOW (ref >60)
GFR calc non Af Amer: 47 mL/min — ABNORMAL LOW (ref >60)
Glucose, Bld: 194 mg/dL — ABNORMAL HIGH (ref 70–99)
Potassium: 4.3 mmol/L (ref 3.5–5.1)
Sodium: 138 mmol/L (ref 135–145)
Total Bilirubin: 1.1 mg/dL (ref 0.3–1.2)
Total Protein: 7.7 g/dL (ref 6.5–8.1)

## 2018-05-16 LAB — HEMOGLOBIN A1C
Hgb A1c MFr Bld: 7.2 % — ABNORMAL HIGH (ref 4.8–5.6)
Mean Plasma Glucose: 159.94 mg/dL

## 2018-05-16 LAB — CBC
HCT: 43.9 % (ref 39.0–52.0)
Hemoglobin: 14.8 g/dL (ref 13.0–17.0)
MCH: 31 pg (ref 26.0–34.0)
MCHC: 33.7 g/dL (ref 30.0–36.0)
MCV: 91.8 fL (ref 80.0–100.0)
Platelets: 428 10*3/uL — ABNORMAL HIGH (ref 150–400)
RBC: 4.78 MIL/uL (ref 4.22–5.81)
RDW: 13.3 % (ref 11.5–15.5)
WBC: 43.3 10*3/uL — ABNORMAL HIGH (ref 4.0–10.5)
nRBC: 0 % (ref 0.0–0.2)

## 2018-05-16 LAB — PROTIME-INR
INR: 1.4 — ABNORMAL HIGH (ref 0.8–1.2)
Prothrombin Time: 17 seconds — ABNORMAL HIGH (ref 11.4–15.2)

## 2018-05-16 LAB — GLUCOSE, CAPILLARY
Glucose-Capillary: 223 mg/dL — ABNORMAL HIGH (ref 70–99)
Glucose-Capillary: 182 mg/dL — ABNORMAL HIGH (ref 70–99)
Glucose-Capillary: 211 mg/dL — ABNORMAL HIGH (ref 70–99)
Glucose-Capillary: 234 mg/dL — ABNORMAL HIGH (ref 70–99)
Glucose-Capillary: 259 mg/dL — ABNORMAL HIGH (ref 70–99)

## 2018-05-16 SURGERY — ENDOSCOPIC RETROGRADE CHOLANGIOPANCREATOGRAPHY (ERCP) WITH PROPOFOL
Anesthesia: General

## 2018-05-16 MED ORDER — MIDAZOLAM HCL 2 MG/2ML IJ SOLN
INTRAMUSCULAR | Status: DC | PRN
  Administered 2018-05-16: 2 mg via INTRAVENOUS

## 2018-05-16 MED ORDER — FENTANYL CITRATE (PF) 100 MCG/2ML IJ SOLN
INTRAMUSCULAR | Status: AC
  Filled 2018-05-16: qty 2

## 2018-05-16 MED ORDER — PROMETHAZINE HCL 25 MG/ML IJ SOLN: 6.2500 mg | INTRAMUSCULAR | Status: DC | PRN

## 2018-05-16 MED ORDER — PROPOFOL 10 MG/ML IV BOLUS
INTRAVENOUS | Status: AC
  Filled 2018-05-16: qty 20

## 2018-05-16 MED ORDER — MIDAZOLAM HCL 2 MG/2ML IJ SOLN
INTRAMUSCULAR | Status: AC | PRN
  Administered 2018-05-16 (×2): 1 mg via INTRAVENOUS

## 2018-05-16 MED ORDER — LIDOCAINE HCL (CARDIAC) PF 100 MG/5ML IV SOSY
PREFILLED_SYRINGE | INTRAVENOUS | Status: DC | PRN
  Administered 2018-05-16: 100 mg via INTRAVENOUS

## 2018-05-16 MED ORDER — DEXAMETHASONE SODIUM PHOSPHATE 4 MG/ML IJ SOLN
INTRAMUSCULAR | Status: AC
  Filled 2018-05-16: qty 1

## 2018-05-16 MED ORDER — MIDAZOLAM HCL 2 MG/2ML IJ SOLN
INTRAMUSCULAR | Status: AC
  Filled 2018-05-16: qty 2

## 2018-05-16 MED ORDER — ONDANSETRON HCL 4 MG/2ML IJ SOLN
INTRAMUSCULAR | Status: DC | PRN
  Administered 2018-05-16: 4 mg via INTRAVENOUS

## 2018-05-16 MED ORDER — FENTANYL CITRATE (PF) 100 MCG/2ML IJ SOLN
INTRAMUSCULAR | Status: AC | PRN
  Administered 2018-05-16 (×2): 50 ug via INTRAVENOUS

## 2018-05-16 MED ORDER — GLYCOPYRROLATE 0.2 MG/ML IJ SOLN
INTRAMUSCULAR | Status: AC
  Filled 2018-05-16: qty 1

## 2018-05-16 MED ORDER — SUCCINYLCHOLINE CHLORIDE 20 MG/ML IJ SOLN
INTRAMUSCULAR | Status: DC | PRN
  Administered 2018-05-16: 120 mg via INTRAVENOUS

## 2018-05-16 MED ORDER — GLYCOPYRROLATE 0.2 MG/ML IJ SOLN
INTRAMUSCULAR | Status: DC | PRN
  Administered 2018-05-16: 0.2 mg via INTRAVENOUS

## 2018-05-16 MED ORDER — SODIUM CHLORIDE 0.9 % IV SOLN: INTRAVENOUS | Status: DC

## 2018-05-16 MED ORDER — LIDOCAINE HCL (PF) 1 % IJ SOLN
INTRAMUSCULAR | Status: AC | PRN
  Administered 2018-05-16: 10 mL

## 2018-05-16 MED ORDER — SODIUM CHLORIDE 0.9% FLUSH
5.0000 mL | Freq: Three times a day (TID) | INTRAVENOUS | Status: DC
  Administered 2018-05-16 – 2018-05-20 (×12): 5 mL

## 2018-05-16 MED ORDER — PROPOFOL 10 MG/ML IV BOLUS
INTRAVENOUS | Status: DC | PRN
  Administered 2018-05-16: 150 mg via INTRAVENOUS

## 2018-05-16 MED ORDER — FENTANYL CITRATE (PF) 100 MCG/2ML IJ SOLN
INTRAMUSCULAR | Status: DC | PRN
  Administered 2018-05-16 (×2): 50 ug via INTRAVENOUS

## 2018-05-16 MED ORDER — LIDOCAINE HCL (PF) 2 % IJ SOLN
INTRAMUSCULAR | Status: AC
  Filled 2018-05-16: qty 10

## 2018-05-16 MED ORDER — INDOMETHACIN 50 MG RE SUPP
100.0000 mg | Freq: Once | RECTAL | Status: DC
  Filled 2018-05-16: qty 2

## 2018-05-16 MED ORDER — DEXAMETHASONE SODIUM PHOSPHATE 10 MG/ML IJ SOLN
INTRAMUSCULAR | Status: DC | PRN
  Administered 2018-05-16: 4 mg via INTRAVENOUS

## 2018-05-16 MED ORDER — INSULIN ASPART 100 UNIT/ML ~~LOC~~ SOLN
0.0000 [IU] | Freq: Three times a day (TID) | SUBCUTANEOUS | Status: DC
  Administered 2018-05-16 – 2018-05-17 (×3): 5 [IU] via SUBCUTANEOUS
  Administered 2018-05-17: 09:00:00 3 [IU] via SUBCUTANEOUS
  Administered 2018-05-18 (×2): 8 [IU] via SUBCUTANEOUS
  Filled 2018-05-16 (×5): qty 1

## 2018-05-16 MED ORDER — ONDANSETRON HCL 4 MG/2ML IJ SOLN
INTRAMUSCULAR | Status: AC
  Filled 2018-05-16: qty 2

## 2018-05-16 MED ORDER — FENTANYL CITRATE (PF) 100 MCG/2ML IJ SOLN: 25.0000 ug | INTRAMUSCULAR | Status: DC | PRN

## 2018-05-16 MED ORDER — PHENYLEPHRINE HCL (PRESSORS) 10 MG/ML IV SOLN
INTRAVENOUS | Status: DC | PRN
  Administered 2018-05-16 (×3): 200 ug via INTRAVENOUS

## 2018-05-16 MED ORDER — INDOMETHACIN 50 MG RE SUPP
RECTAL | Status: DC | PRN
  Administered 2018-05-16: 100 mg via RECTAL

## 2018-05-16 NOTE — Progress Notes (Addendum)
[facility] Hospital Day(s): 1.   Post op day(s): Day of Surgery.   Interval History: Patient seen and examined, no acute events or new complaints overnight. Patient reports the pain on the right upper current has not improved, denies fever or chills. New labs today shows no improvement in white blood cell count.  There is some tachycardia and decreasing blood pressure.  No fever  Vital signs in last 24 hours: [min-max] current  Temp:  [98 F (36.7 C)-99 F (37.2 C)] 98.8 F (37.1 C) (04/10 0429) Pulse Rate:  [75-111] 104 (04/10 0429) Resp:  [18-26] 20 (04/10 0429) BP: (95-150)/(56-83) 95/56 (04/10 0429) SpO2:  [85 %-97 %] 94 % (04/10 0429)     Height: 5\' 8"  (172.7 cm) Weight: 104.3 kg BMI (Calculated): 34.98   Physical Exam:  Constitutional: alert, cooperative and no distress  Respiratory: breathing non-labored at rest  Cardiovascular: regular rate and sinus rhythm  Gastrointestinal: soft, tender right upper quadrant, and non-distended  Labs:  CBC Latest Ref Rng & Units 05/16/2018 05/15/2018 07/14/2015  WBC 4.0 - 10.5 K/uL 43.3(H) 44.1(H) 20.7(H)  Hemoglobin 13.0 - 17.0 g/dL 14.8 16.2 16.0  Hematocrit 39.0 - 52.0 % 43.9 47.5 46.2  Platelets 150 - 400 K/uL 428(H) 501(H) 441(H)   CMP Latest Ref Rng & Units 05/16/2018 05/15/2018 07/14/2015  Glucose 70 - 99 mg/dL 194(H) 342(H) 162(H)  BUN 8 - 23 mg/dL 37(H) 29(H) 26(H)  Creatinine 0.61 - 1.24 mg/dL 1.48(H) 1.24 1.32(H)  Sodium 135 - 145 mmol/L 138 134(L) 138  Potassium 3.5 - 5.1 mmol/L 4.3 4.1 4.0  Chloride 98 - 111 mmol/L 105 98 106  CO2 22 - 32 mmol/L 23 20(L) 24  Calcium 8.9 - 10.3 mg/dL 8.8(L) 9.7 9.2  Total Protein 6.5 - 8.1 g/dL 7.7 8.7(H) -  Total Bilirubin 0.3 - 1.2 mg/dL 1.1 1.9(H) -  Alkaline Phos 38 - 126 U/L 61 78 -  AST 15 - 41 U/L 26 25 -  ALT 0 - 44 U/L 16 15 -   Imaging studies: No new pertinent imaging studies  Assessment/Plan:  73 y.o. male with cholecystitis with choledocholithiasis,  complicated by pertinent comorbidities including hypertension and diabetes. Patient with acute cholecystitis and choledocholithiasis.  He is going to ERCP now.  He is white blood cell count did not decrease with adequate antibiotic therapy and his pain has not improved either.  Due to these findings I would recommend percutaneous cholecystostomy due to the length of the pain onset and the amount inflammation seen on the CT scan.  I think that due to the current COVID-19 pandemic and his clinical status, this is the safest option to treat the acute process and then performing interval cholecystectomy after the inflammation has healed.  I agree to continue IV antibiotic therapy.  We will discussed with IR for placement of percutaneous cholecystostomy.  We will continue to follow with physical exam, vital signs and labs to assess improvement.  Arnold Long, MD  Addendum:   I called the daughter Azzie Roup at [phone].  I oriented her about the recommendation of having percutaneous cholecystostomy done on Mr. Tith.  She understood the rationale of the drainage of the gallbladder and agrees to proceed with procedure.  She reports that she is the power of attorney in case they need to take consent.

## 2018-05-16 NOTE — Anesthesia Preprocedure Evaluation (Signed)
Anesthesia Evaluation  Patient identified by MRN, date of birth, ID band Patient awake    Reviewed: Allergy & Precautions, NPO status , Patient's Chart, lab work & pertinent test results  History of Anesthesia Complications Negative for: history of anesthetic complications  Airway Mallampati: III  TM Distance: >3 FB Neck ROM: Full    Dental  (+) Poor Dentition   Pulmonary neg sleep apnea, neg COPD, former smoker,    breath sounds clear to auscultation- rhonchi (-) wheezing      Cardiovascular hypertension, Pt. on medications (-) CAD, (-) Past MI, (-) Cardiac Stents and (-) CABG  Rhythm:Regular Rate:Normal - Systolic murmurs and - Diastolic murmurs    Neuro/Psych neg Seizures negative neurological ROS  negative psych ROS   GI/Hepatic negative GI ROS, Neg liver ROS,   Endo/Other  diabetes, Oral Hypoglycemic Agents  Renal/GU negative Renal ROS     Musculoskeletal negative musculoskeletal ROS (+)   Abdominal (+) + obese,   Peds  Hematology negative hematology ROS (+)   Anesthesia Other Findings Past Medical History: No date: Diabetes mellitus without complication ([facility]) No date: Hyperlipidemia No date: Hypertension   Reproductive/Obstetrics                             Anesthesia Physical Anesthesia Plan  ASA: III  Anesthesia Plan: General   Post-op Pain Management:    Induction: Intravenous  PONV Risk Score and Plan: 1 and Ondansetron and Midazolam  Airway Management Planned: Oral ETT  Additional Equipment:   Intra-op Plan:   Post-operative Plan: Extubation in OR  Informed Consent: I have reviewed the patients History and Physical, chart, labs and discussed the procedure including the risks, benefits and alternatives for the proposed anesthesia with the patient or authorized representative who has indicated his/her understanding and acceptance.     Dental advisory  given  Plan Discussed with: CRNA and Anesthesiologist  Anesthesia Plan Comments:         Anesthesia Quick Evaluation

## 2018-05-16 NOTE — Transfer of Care (Signed)
Immediate Anesthesia Transfer of Care Note  Patient: Brent [name].  Procedure(s) Performed: Procedure(s): ENDOSCOPIC RETROGRADE CHOLANGIOPANCREATOGRAPHY (ERCP) WITH PROPOFOL (N/A)  Patient Location: PACU  Anesthesia Type:General  Level of Consciousness: sedated  Airway & Oxygen Therapy: Patient Spontanous Breathing and Patient connected to face mask oxygen  Post-op Assessment: Report given to RN and Post -op Vital signs reviewed and stable  Post vital signs: Reviewed and stable  Last Vitals:  Vitals:   05/16/18 0858 05/16/18 1045  BP: 132/80 116/64  Pulse: (!) 110 97  Resp: 19 18  Temp: 37.7 C 37.2 C  SpO2: 97% 35%    Complications: No apparent anesthesia complications

## 2018-05-16 NOTE — Anesthesia Procedure Notes (Signed)
Procedure Name: Intubation Date/Time: 05/16/2018 10:06 AM Performed by: Doreen Salvage, CRNA Pre-anesthesia Checklist: Patient identified, Emergency Drugs available, Suction available and Patient being monitored Patient Re-evaluated:Patient Re-evaluated prior to induction Oxygen Delivery Method: Circle system utilized Preoxygenation: Pre-oxygenation with 100% oxygen Induction Type: IV induction, Cricoid Pressure applied and Rapid sequence Ventilation: Mask ventilation without difficulty Laryngoscope Size: Mac, McGraph and 4 Grade View: Grade I Tube type: Oral Tube size: 7.0 mm Number of attempts: 1 Airway Equipment and Method: Stylet Placement Confirmation: ETT inserted through vocal cords under direct vision,  positive ETCO2 and breath sounds checked- equal and bilateral Secured at: 21 cm Tube secured with: Tape Dental Injury: Teeth and Oropharynx as per pre-operative assessment

## 2018-05-16 NOTE — Progress Notes (Signed)
Patient post Cholecystomy tube placement per Dr Barbie Banner. Vitals stable. Patient arousable upon voice, alert and oriented when spoken to . Sinus rhythm per monitor. Denies complaints at this time. Report given to [facility] on 208, with questions answered. Chole  Tube draining black thick sludge. dressing  Dry and intact.

## 2018-05-16 NOTE — Consult Note (Signed)
Chief Complaint: Patient was seen in consultation today for  Chief Complaint  Patient presents with   Abdominal Pain   Emesis   at the request of * No referring provider recorded for this case *  Referring Physician(s): * No referring provider recorded for this case *  Supervising Physician: Marybelle Killings  Patient Status: [place] - In-pt  History of Present Illness: Brent Crooke. is a 73 y.o. male with a history of diabets who presented with a 2 day history of abdominal pain. He was admitted after a CT abd showed acute cholecystitis. He has a low grade fever and leukocytosis.   Past Medical History:  Diagnosis Date   Diabetes mellitus without complication ([place])    Hyperlipidemia    Hypertension     Past Surgical History:  Procedure Laterality Date   CARPAL TUNNEL RELEASE Left 10/2015   COLONOSCOPY     COLONOSCOPY WITH PROPOFOL N/A 08/05/2017   Procedure: COLONOSCOPY WITH PROPOFOL;  Surgeon: Manya Silvas, MD;  Location: [facility] ENDOSCOPY;  Service: Endoscopy;  Laterality: N/A;   SPLENECTOMY      Allergies: Patient has no known allergies.  Medications: Prior to Admission medications   Medication Sig Start Date End Date Taking? Authorizing Provider  aspirin EC 81 MG tablet Take 81 mg by mouth daily.    Yes [provider]  atorvastatin (LIPITOR) 40 MG tablet Take 40 mg by mouth daily.   Yes [provider]  hydrochlorothiazide (HYDRODIURIL) 12.5 MG tablet Take 1 tablet by mouth daily. 06/30/15  Yes [provider]  lisinopril (PRINIVIL,ZESTRIL) 40 MG tablet Take 1 tablet by mouth daily. 05/01/15  Yes [provider]  metFORMIN (GLUCOPHAGE) 500 MG tablet Take 500 mg by mouth daily with breakfast. 02/28/18  Yes [provider]  ketoconazole (NIZORAL) 2 % cream Apply 1 application topically at bedtime. 01/27/18   [provider]  loratadine (CLARITIN) 10 MG tablet Take 10 mg by mouth daily as needed for  allergies.    [provider]  tadalafil (CIALIS) 10 MG tablet Take 10 mg by mouth as directed. 04/10/18   [provider]  triamcinolone cream (KENALOG) 0.5 % Apply 1 application topically 2 (two) times daily.    [provider]     No family history on file.  Social History   Socioeconomic History   Marital status: Married    Spouse name: Not on file   Number of children: Not on file   Years of education: Not on file   Highest education level: Not on file  Occupational History   Not on file  Social Needs   Financial resource strain: Not on file   Food insecurity:    Worry: Not on file    Inability: Not on file   Transportation needs:    Medical: Not on file    Non-medical: Not on file  Tobacco Use   Smoking status: Former Smoker   Smokeless tobacco: Never Used  Substance and Sexual Activity   Alcohol use: Not Currently   Drug use: Never   Sexual activity: Not on file  Lifestyle   Physical activity:    Days per week: Not on file    Minutes per session: Not on file   Stress: Not on file  Relationships   Social connections:    Talks on phone: Not on file    Gets together: Not on file    Attends religious service: Not on file    Active  member of club or organization: Not on file    Attends meetings of clubs or organizations: Not on file    Relationship status: Not on file  Other Topics Concern   Not on file  Social History Narrative   Not on file    Review of Systems: A 12 point ROS discussed and pertinent positives are indicated in the HPI above.  All other systems are negative.  Review of Systems  Vital Signs: BP 114/72 (BP Location: Left Arm)    Pulse 80    Temp 99.5 F (37.5 C) (Oral)    Resp 20    Ht 5\' 8"  (1.727 m)    Wt 104.3 kg    SpO2 93%    BMI 34.97 kg/m   Physical Exam Constitutional:      Appearance: He is obese.  HENT:     Head: Normocephalic and atraumatic.  Cardiovascular:     Rate and Rhythm:  Normal rate and regular rhythm.  Pulmonary:     Effort: Pulmonary effort is normal.     Breath sounds: Normal breath sounds.  Abdominal:     General: Abdomen is protuberant.     Tenderness: There is generalized abdominal tenderness.  Skin:    General: Skin is warm and dry.  Neurological:     General: No focal deficit present.     Mental Status: He is alert and oriented to person, place, and time.     Imaging: Dg Chest 1 View  Result Date: 05/16/2018 CLINICAL DATA:  Hypoxia EXAM: CHEST  1 VIEW COMPARISON:  CT abdomen 05/15/2018 FINDINGS: Bilateral lower lobe hazy airspace disease likely reflecting atelectasis. No right pleural effusion. Possible trace left pleural effusion. No pneumothorax. Normal heart size. Right perihilar opacity which may reflect adenopathy. No aggressive osseous lesion. IMPRESSION: 1. Bibasilar airspace disease likely reflecting atelectasis. 2. Right perihilar opacity which may reflect adenopathy. If there is further clinical concern, recommend a CT of the chest. Electronically Signed   By: Kathreen Devoid   On: 05/16/2018 12:56   Ct Abdomen Pelvis W Contrast  Result Date: 05/15/2018 CLINICAL DATA:  73 year old male with abdominal pain EXAM: CT ABDOMEN AND PELVIS WITH CONTRAST TECHNIQUE: Multidetector CT imaging of the abdomen and pelvis was performed using the standard protocol following bolus administration of intravenous contrast. CONTRAST:  139mL OMNIPAQUE IOHEXOL 300 MG/ML  SOLN COMPARISON:  None. FINDINGS: Lower chest: Atelectatic changes at the lung bases with no acute finding. Hepatobiliary: Unremarkable liver. Gallbladder is distended with layered mixed density material at the gallbladder neck. Hyperdense material within the cystic duct, and at the distal common bile duct above the ampulla. No intrahepatic biliary ductal dilatation. Inflammatory changes within the fat adjacent to the gallbladder with trace fluid at the fascial planes. Pancreas: Unremarkable pancreas  Spleen: Small volume residual splenic tissue, potentially from prior resection/splenectomy with possible splenosis/splenule is a remaining. Adrenals/Urinary Tract: Unremarkable adrenal glands. Bilateral kidneys unremarkable without hydronephrosis or nephrolithiasis. Unremarkable course of the ureters. Urinary bladder relatively decompressed. Stomach/Bowel: Unremarkable stomach. Small hiatal hernia. Inflammatory changes adjacent to the duodenum are likely secondary to the inflamed gallbladder. Mild dilation of small bowel with air-fluid levels and no evidence of transition. Normal appendix. Mild stool burden. Diverticular change without evidence of acute diverticulitis. Vascular/Lymphatic: Atherosclerotic changes of the abdominal aorta. Mesenteric and renal arteries are patent. Bilateral iliac arteries and bilateral proximal femoral arteries are patent. Unremarkable venous system. Reproductive: Unremarkable appearance of the pelvic structures. Other: .  Trace free fluid within the pelvis.  Musculoskeletal: No displaced fracture. 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 Advanced disc disease at L5-S1. More subtle disc disease throughout the remainder of the lumbar levels. Degenerative changes of the hips. IMPRESSION: CT findings of acute cholecystitis, with retained choledocholithiasis of the common bile duct. Referral for surgical evaluation is indicated. These results were discussed by telephone at the time of interpretation on 05/15/2018 at 9:28 am with Dr. Carrie Mew. Aortic Atherosclerosis (ICD10-I70.0). Additional ancillary findings as above. Electronically Signed   By: Corrie Mckusick D.O.   On: 05/15/2018 09:30   Dg C-arm 1-60 Min-no Report  Result Date: 05/16/2018 Fluoroscopy was utilized by the requesting physician.  No radiographic interpretation.    Labs:  CBC: Recent Labs    05/15/18 0818 05/16/18 0406  WBC 44.1* 43.3*  HGB 16.2 14.8  HCT 47.5 43.9  PLT 501*  428*    COAGS: Recent Labs    05/16/18 0406  INR 1.4*    BMP: Recent Labs    05/15/18 0818 05/16/18 0406  NA 134* 138  K 4.1 4.3  CL 98 105  CO2 20* 23  GLUCOSE 342* 194*  BUN 29* 37*  CALCIUM 9.7 8.8*  CREATININE 1.24 1.48*  GFRNONAA 58* 47*  GFRAA >60 54*    LIVER FUNCTION TESTS: Recent Labs    05/15/18 0818 05/16/18 0406  BILITOT 1.9* 1.1  AST 25 26  ALT 15 16  ALKPHOS 78 61  PROT 8.7* 7.7  ALBUMIN 4.2 3.6    TUMOR MARKERS: No results for input(s): AFPTM, CEA, CA199, CHROMGRNA in the last 8760 hours.  Assessment and Plan:  Acute cholecystitis with comorbidities. Surgeryhas requested cholecystostomy. The procedure was explained to the patient's daughter. She consent.  Thank you for this interesting consult.  I greatly enjoyed meeting Brent [name]. and look forward to participating in their care.  A copy of this report was sent to the requesting provider on this date.  Electronically Signed: Art A Vito Beg, MD 05/16/2018, 3:02 PM   I spent a total of 55 Mi[place]utes  in face to face in clinical consultation, greater than 50% of which was counseling/coordinating care for cholecystotomy placement.

## 2018-05-16 NOTE — Procedures (Signed)
10 Fr Cholecystostomy Drain Bilious fluid EBL 0 Comp 0

## 2018-05-16 NOTE — Progress Notes (Signed)
[place] at [place] NAME: Brent [name]    MR#:  [id number]  DATE OF BIRTH:  12-06-1945  SUBJECTIVE:  CHIEF COMPLAINT:   Chief Complaint  Patient presents with  . Abdominal Pain  . Emesis   Continues to have abdominal pain.  Waiting for ERCP. Does not complain of shortness of breath.  But he was placed on 2 L oxygen overnight due to hypoxia up to 85%  REVIEW OF SYSTEMS:    Review of Systems  Constitutional: Positive for malaise/fatigue. Negative for chills and fever.  HENT: Negative for sore throat.   Eyes: Negative for blurred vision, double vision and pain.  Respiratory: Negative for cough, hemoptysis, shortness of breath and wheezing.   Cardiovascular: Negative for chest pain, palpitations, orthopnea and leg swelling.  Gastrointestinal: Positive for abdominal pain and nausea. Negative for constipation, diarrhea, heartburn and vomiting.  Genitourinary: Negative for dysuria and hematuria.  Musculoskeletal: Negative for back pain and joint pain.  Skin: Negative for rash.  Neurological: Negative for sensory change, speech change, focal weakness and headaches.  Endo/Heme/Allergies: Does not bruise/bleed easily.  Psychiatric/Behavioral: Negative for depression. The patient is not nervous/anxious.     DRUG ALLERGIES:  No Known Allergies  VITALS:  Blood pressure 110/60, pulse 92, temperature 98.9 F (37.2 C), temperature source Temporal, resp. rate 15, height 5\' 8"  (1.727 m), weight 104.3 kg, SpO2 94 %.  PHYSICAL EXAMINATION:   Physical Exam  GENERAL:  73 y.o.-year-old patient lying in the bed , obese EYES: Pupils equal, round, reactive to light and accommodation. No scleral icterus. Extraocular muscles intact.  HEENT: Head atraumatic, normocephalic. Oropharynx and nasopharynx clear.  NECK:  Supple, no jugular venous distention. No thyroid enlargement, no tenderness.  LUNGS: Normal breath sounds bilaterally, no wheezing, rales, rhonchi.  No use of accessory muscles of respiration.  CARDIOVASCULAR: S1, S2 normal. No murmurs, rubs, or gallops.  ABDOMEN: Soft, tenderness in the upper abdomen, nondistended. Bowel sounds present. No organomegaly or mass.  EXTREMITIES: No cyanosis, clubbing or edema b/l.    NEUROLOGIC: Cranial nerves II through XII are intact. No focal Motor or sensory deficits b/l.   PSYCHIATRIC: The patient is alert and oriented x 3.  SKIN: No obvious rash, lesion, or ulcer.   LABORATORY PANEL:   CBC Recent Labs  Lab 05/16/18 0406  WBC 43.3*  HGB 14.8  HCT 43.9  PLT 428*   ------------------------------------------------------------------------------------------------------------------ Chemistries  Recent Labs  Lab 05/16/18 0406  NA 138  K 4.3  CL 105  CO2 23  GLUCOSE 194*  BUN 37*  CREATININE 1.48*  CALCIUM 8.8*  AST 26  ALT 16  ALKPHOS 61  BILITOT 1.1   ------------------------------------------------------------------------------------------------------------------  Cardiac Enzymes No results for input(s): TROPONINI in the last 168 hours. ------------------------------------------------------------------------------------------------------------------  RADIOLOGY:  Ct Abdomen Pelvis W Contrast  Result Date: 05/15/2018 CLINICAL DATA:  73 year old male with abdominal pain EXAM: CT ABDOMEN AND PELVIS WITH CONTRAST TECHNIQUE: Multidetector CT imaging of the abdomen and pelvis was performed using the standard protocol following bolus administration of intravenous contrast. CONTRAST:  162mL OMNIPAQUE IOHEXOL 300 MG/ML  SOLN COMPARISON:  None. FINDINGS: Lower chest: Atelectatic changes at the lung bases with no acute finding. Hepatobiliary: Unremarkable liver. Gallbladder is distended with layered mixed density material at the gallbladder neck. Hyperdense material within the cystic duct, and at the distal common bile duct above the ampulla. No intrahepatic biliary ductal dilatation. Inflammatory  changes within the fat adjacent to the gallbladder with trace fluid  at the fascial planes. Pancreas: Unremarkable pancreas Spleen: Small volume residual splenic tissue, potentially from prior resection/splenectomy with possible splenosis/splenule is a remaining. Adrenals/Urinary Tract: Unremarkable adrenal glands. Bilateral kidneys unremarkable without hydronephrosis or nephrolithiasis. Unremarkable course of the ureters. Urinary bladder relatively decompressed. Stomach/Bowel: Unremarkable stomach. Small hiatal hernia. Inflammatory changes adjacent to the duodenum are likely secondary to the inflamed gallbladder. Mild dilation of small bowel with air-fluid levels and no evidence of transition. Normal appendix. Mild stool burden. Diverticular change without evidence of acute diverticulitis. Vascular/Lymphatic: Atherosclerotic changes of the abdominal aorta. Mesenteric and renal arteries are patent. Bilateral iliac arteries and bilateral proximal femoral arteries are patent. Unremarkable venous system. Reproductive: Unremarkable appearance of the pelvic structures. Other: .  Trace free fluid within the pelvis. Musculoskeletal: No displaced fracture. 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 Advanced disc disease at L5-S1. More subtle disc disease throughout the remainder of the lumbar levels. Degenerative changes of the hips. IMPRESSION: CT findings of acute cholecystitis, with retained choledocholithiasis of the common bile duct. Referral for surgical evaluation is indicated. These results were discussed by telephone at the time of interpretation on 05/15/2018 at 9:28 am with Dr. Carrie Mew. Aortic Atherosclerosis (ICD10-I70.0). Additional ancillary findings as above. Electronically Signed   By: Corrie Mckusick D.O.   On: 05/15/2018 09:30   Dg C-arm 1-60 Min-no Report  Result Date: 05/16/2018 Fluoroscopy was utilized by the requesting physician.  No radiographic interpretation.      ASSESSMENT AND PLAN:   *Acute  Cholecystitis and choledocholithiasis with sepsis present on admission ERCP later today On IV Unasyn Discussed with Dr. Verl Blalock and Dr. Peyton Najjar. N.p.o. Interventional radiology consulted for percutaneous cholecystostomy tube  *Acute hypoxic respiratory failure.  Likely atelectasis.  Will check chest x-ray.  Continue oxygen.  Added incentive spirometer.  *Hypertension.   Hold medications due to low normal blood pressure.  *Diabetes mellitus.  Sliding scale insulin.  Held metformin.  DVT prophylaxis with Lovenox  All the records are reviewed and case discussed with Care Management/Social Worker. Management plans discussed with the patient, family and they are in agreement.  CODE STATUS: FULL CODE  DVT Prophylaxis: SCDs  TOTAL TIME TAKING CARE OF THIS PATIENT: 35 minutes.   POSSIBLE D/C IN 2-3 DAYS, DEPENDING ON CLINICAL CONDITION.  Leia Alf Janyra Barillas M.D on 05/16/2018 at 11:03 AM  Between 7am to 6pm - Pager - [phone]  After 6pm go to [email or web address] - password [email or web address] [facility]  Office  [phone]  CC: Primary care physician; [name], MD  Note: This dictation was prepared with Dragon dictation along with smaller phrase technology. Any transcriptional errors that result from this process are unintentional.

## 2018-05-16 NOTE — Consult Note (Signed)
Pharmacy Antibiotic Note  Brent [name]. is a 73 y.o. male admitted on 05/15/2018 with intra-abdominal infection.  Pharmacy has been consulted for Unasyn dosing.  Plan: Continue Unasyn IV 3g q6h  Height: 5\' 8"  (172.7 cm) Weight: 230 lb (104.3 kg) IBW/kg (Calculated) : 68.4  Temp (24hrs), Avg:98.9 F (37.2 C), Min:98 F (36.7 C), Max:99.9 F (37.7 C)  Recent Labs  Lab 05/15/18 0818 05/16/18 0406  WBC 44.1* 43.3*  CREATININE 1.24 1.48*    Estimated Creatinine Clearance: 53.6 mL/min (A) (by C-G formula based on SCr of 1.48 mg/dL (H)).    No Known Allergies  Antimicrobials this admission: Zosyn 4/9 x 1 Unasyn 4/9 >>   Dose adjustments this admission: None  Microbiology results: 4/9 BCx: NGTD  Thank you for allowing pharmacy to be a part of this patient's care.  Rocky Morel, PharmD, BCPS Clinical Pharmacist 05/16/2018 12:01 PM

## 2018-05-16 NOTE — Op Note (Signed)
[facility]astroenterology Patient Name: Brent [name] Procedure Date: 05/16/2018 10:07 AM MRN: [id number] Account #: [id number] Date of Birth: Nov 16, 1945 Admit Type: Inpatient Age: 73 Room: [facility] ENDO ROOM 4 Gender: Male Note Status: Finalized Procedure:            ERCP Indications:          Abnormal abdominal CT, Elevated liver enzymes Providers:            Lucilla Lame MD, MD Referring MD:         [name] (Referring MD) Medicines:            General Anesthesia Complications:        No immediate complications. Procedure:            Pre-Anesthesia Assessment:                       - Prior to the procedure, a History and Physical was                        performed, and patient medications and allergies were                        reviewed. The patient's tolerance of previous                        anesthesia was also reviewed. The risks and benefits of                        the procedure and the sedation options and risks were                        discussed with the patient. All questions were                        answered, and informed consent was obtained. Prior                        Anticoagulants: The patient has taken no previous                        anticoagulant or antiplatelet agents. ASA Grade                        Assessment: III - A patient with severe systemic                        disease. After reviewing the risks and benefits, the                        patient was deemed in satisfactory condition to undergo                        the procedure.                       After obtaining informed consent, the scope was passed                        under direct vision. Throughout the procedure, the  patient's blood pressure, pulse, and oxygen saturations                        were monitored continuously. The Duodenoscope was                        introduced through the mouth, and used to inject                         contrast into and used to inject contrast into the bile                        duct. The ERCP was accomplished without difficulty. The                        patient tolerated the procedure well. Findings:      The scout film was normal. The esophagus was successfully intubated       under direct vision. The scope was advanced to a normal major papilla in       the descending duodenum without detailed examination of the pharynx,       larynx and associated structures, and upper GI tract. The upper GI tract       was grossly normal. The upper GI tract was traversed under direct vision       without detailed examination. Many non-bleeding superficial duodenal       ulcers with no stigmata of bleeding were found in the entire duodenum.       The major papilla was normal. The bile duct was deeply cannulated with       the short-nosed traction sphincterotome. Contrast was injected. I       personally interpreted the bile duct images. There was brisk flow of       contrast through the ducts. Image quality was excellent. Contrast       extended to the entire biliary tree. The biliary tree was otherwise       normal. A wire was passed into the biliary tree. A 5 mm biliary       sphincterotomy was made with a traction (standard) sphincterotome using       ERBE electrocautery. There was no post-sphincterotomy bleeding. To       discover objects, the biliary tree was swept with a 15 mm balloon       starting at the bifurcation. Nothing was found. There was a stenosis at       the ampulla that was treated with a sphincterotomy. Impression:           - Non-bleeding duodenal ulcers with no stigmata of                        bleeding.                       - The major papilla appeared normal.                       - A biliary sphincterotomy was performed.                       - The biliary tree was swept and nothing was found.                       -  Ampullary stenosis treated with a  sphincterotomy. Recommendation:       - Return patient to hospital ward for ongoing care.                       - Continue present medications.                       - Watch for pancreatitis, bleeding, perforation, and                        cholangitis. Procedure Code(s):    --- Professional ---                       [phone], Endoscopic retrograde cholangiopancreatography                        (ERCP); with sphincterotomy/papillotomy                       [phone], Endoscopic catheterization of the biliary ductal                        system, radiological supervision and interpretation Diagnosis Code(s):    --- Professional ---                       R74.8, Abnormal levels of other serum enzymes                       R93.5, Abnormal findings on diagnostic imaging of other                        abdominal regions, including retroperitoneum                       K26.9, Duodenal ulcer, unspecified as acute or chronic,                        without hemorrhage or perforation CPT copyright 2019 American Medical Association. All rights reserved. The codes documented in this report are preliminary and upon coder review may  be revised to meet current compliance requirements. Lucilla Lame MD, MD 05/16/2018 10:34:40 AM This report has been signed electronically. Number of Addenda: 0 Note Initiated On: 05/16/2018 10:07 AM      [facility]

## 2018-05-16 NOTE — Anesthesia Post-op Follow-up Note (Signed)
Anesthesia QCDR form completed.        

## 2018-05-16 NOTE — Anesthesia Postprocedure Evaluation (Signed)
Anesthesia Post Note  Patient: Brent [name].  Procedure(s) Performed: ENDOSCOPIC RETROGRADE CHOLANGIOPANCREATOGRAPHY (ERCP) WITH PROPOFOL (N/A )  Patient location during evaluation: PACU Anesthesia Type: General Level of consciousness: awake and alert and oriented Pain management: pain level controlled Vital Signs Assessment: post-procedure vital signs reviewed and stable Respiratory status: spontaneous breathing, nonlabored ventilation and respiratory function stable Cardiovascular status: blood pressure returned to baseline and stable Postop Assessment: no signs of nausea or vomiting Anesthetic complications: no     Last Vitals:  Vitals:   05/16/18 1123 05/16/18 1127  BP:  94/67  Pulse: 92 91  Resp: 16 15  Temp: 37.2 C   SpO2:  94%    Last Pain:  Vitals:   05/16/18 1127  TempSrc:   PainSc: 0-No pain                 Hanan Moen

## 2018-05-17 DIAGNOSIS — K8000 Calculus of gallbladder with acute cholecystitis without obstruction: Secondary | ICD-10-CM

## 2018-05-17 DIAGNOSIS — K8050 Calculus of bile duct without cholangitis or cholecystitis without obstruction: Secondary | ICD-10-CM

## 2018-05-17 LAB — CBC WITH DIFFERENTIAL/PLATELET
Abs Immature Granulocytes: 0 10*3/uL (ref 0.00–0.07)
Basophils Absolute: 0 10*3/uL (ref 0.0–0.1)
Basophils Relative: 0 %
Eosinophils Absolute: 0 10*3/uL (ref 0.0–0.5)
Eosinophils Relative: 0 %
HCT: 38 % — ABNORMAL LOW (ref 39.0–52.0)
Hemoglobin: 12.8 g/dL — ABNORMAL LOW (ref 13.0–17.0)
Lymphocytes Relative: 2 %
Lymphs Abs: 0.6 10*3/uL — ABNORMAL LOW (ref 0.7–4.0)
MCH: 30.7 pg (ref 26.0–34.0)
MCHC: 33.7 g/dL (ref 30.0–36.0)
MCV: 91.1 fL (ref 80.0–100.0)
Monocytes Absolute: 1.2 10*3/uL — ABNORMAL HIGH (ref 0.1–1.0)
Monocytes Relative: 4 %
Neutro Abs: 28.9 10*3/uL — ABNORMAL HIGH (ref 1.7–7.7)
Neutrophils Relative %: 94 %
Platelets: 398 10*3/uL (ref 150–400)
RBC: 4.17 MIL/uL — ABNORMAL LOW (ref 4.22–5.81)
RDW: 13 % (ref 11.5–15.5)
Smear Review: NORMAL
WBC: 30.7 10*3/uL — ABNORMAL HIGH (ref 4.0–10.5)
nRBC: 0 % (ref 0.0–0.2)

## 2018-05-17 LAB — GLUCOSE, CAPILLARY
Glucose-Capillary: 186 mg/dL — ABNORMAL HIGH (ref 70–99)
Glucose-Capillary: 218 mg/dL — ABNORMAL HIGH (ref 70–99)
Glucose-Capillary: 207 mg/dL — ABNORMAL HIGH (ref 70–99)
Glucose-Capillary: 268 mg/dL — ABNORMAL HIGH (ref 70–99)

## 2018-05-17 LAB — COMPREHENSIVE METABOLIC PANEL
ALT: 119 U/L — ABNORMAL HIGH (ref 0–44)
AST: 188 U/L — ABNORMAL HIGH (ref 15–41)
Albumin: 3.2 g/dL — ABNORMAL LOW (ref 3.5–5.0)
Alkaline Phosphatase: 102 U/L (ref 38–126)
Anion gap: 12 (ref 5–15)
BUN: 63 mg/dL — ABNORMAL HIGH (ref 8–23)
CO2: 24 mmol/L (ref 22–32)
Calcium: 8.4 mg/dL — ABNORMAL LOW (ref 8.9–10.3)
Chloride: 102 mmol/L (ref 98–111)
Creatinine, Ser: 2.19 mg/dL — ABNORMAL HIGH (ref 0.61–1.24)
GFR calc Af Amer: 34 mL/min — ABNORMAL LOW (ref >60)
GFR calc non Af Amer: 29 mL/min — ABNORMAL LOW (ref >60)
Glucose, Bld: 193 mg/dL — ABNORMAL HIGH (ref 70–99)
Potassium: 4.1 mmol/L (ref 3.5–5.1)
Sodium: 138 mmol/L (ref 135–145)
Total Bilirubin: 3.3 mg/dL — ABNORMAL HIGH (ref 0.3–1.2)
Total Protein: 6.6 g/dL (ref 6.5–8.1)

## 2018-05-17 MED ORDER — SODIUM CHLORIDE 0.9 % IV SOLN: INTRAVENOUS | Status: DC

## 2018-05-17 NOTE — Progress Notes (Signed)
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  The patient then underwent a percutaneous cholecystostomy with drain placement. Despite the ERCP and drainage of his gallbladder Percutaneously the patient's liver enzymes shot up today.  The patient denies any abdominal pain nausea vomiting fevers or chills.  His white cell count has also decreased.  Objective: Vital signs in last 24 hours: Vitals:   05/16/18 1954 05/17/18 0500 05/17/18 1211 05/17/18 1213  BP: 106/64 (!) 104/51 (!) 84/26 (!) 117/58  Pulse: 84 96 91 83  Resp: 16 16 16    Temp: 98.8 F (37.1 C) 98.4 F (36.9 C) 98.5 F (36.9 C)   TempSrc: Oral Oral Oral   SpO2: 93% 94% 93%   Weight:      Height:       Weight change:   Intake/Output Summary (Last 24 hours) at 05/17/2018 1437 Last data filed at 05/17/2018 1342 Gross per 24 hour  Intake 1827.06 ml  Output 350 ml  Net 1477.06 ml     Exam: Heart:: Regular rate and rhythm, S1S2 present or without murmur or extra heart sounds Lungs: normal and clear to auscultation and percussion Abdomen: soft, nontender, normal bowel sounds   Lab Results: @LABTEST2 @ Micro Results: Recent Results (from the past 240 hour(s))  CULTURE, BLOOD (ROUTINE X 2) w Reflex to ID Panel     Status: None (Preliminary result)   Collection Time: 05/15/18 12:16 PM  Result Value Ref Range Status   Specimen Description BLOOD RIGHT ANTECUBITAL  Final   Special Requests   Final    BOTTLES DRAWN AEROBIC AND ANAEROBIC Blood Culture adequate volume   Culture   Final    NO GROWTH 2 DAYS Performed at [facility], [address] [place], [place] [address]    Report Status PENDING  Incomplete  CULTURE, BLOOD (ROUTINE X 2) w Reflex to ID Panel     Status: None (Preliminary result)   Collection Time: 05/15/18 12:23 PM  Result  Value Ref Range Status   Specimen Description BLOOD RT HAND  Final   Special Requests   Final    BOTTLES DRAWN AEROBIC AND ANAEROBIC Blood Culture adequate volume   Culture   Final    NO GROWTH 2 DAYS Performed at [facility], [address] [place], [place] [address]    Report Status PENDING  Incomplete  Surgical PCR screen     Status: Abnormal   Collection Time: 05/15/18  1:34 PM  Result Value Ref Range Status   MRSA, PCR POSITIVE (A) NEGATIVE Final    Comment: CRITICAL RESULT CALLED TO, READ BACK BY AND VERIFIED WITH: CALLED TO MOLLY WEISMILLER @1500  05/15/2018 SAC    Staphylococcus aureus POSITIVE (A) NEGATIVE Final    Comment: (NOTE) The Xpert SA Assay (FDA approved for NASAL specimens in patients 67 years of age and older), is one component of a comprehensive surveillance program. It is not intended to diagnose infection nor to guide or monitor treatment. Performed at [facility], [address] [place], [place] [address]   Aerobic/Anaerobic Culture (surgical/deep wound)     Status: None (Preliminary result)   Collection Time: 05/16/18  3:34 PM  Result Value Ref Range Status   Specimen Description   Final    GALL BLADDER Performed at [facility], [address], [place], [place] [address]  Special Requests   Final    NONE Performed at [facility], [place], [place] [address]    Gram Stain NO WBC SEEN NO ORGANISMS SEEN   Final   Culture   Final    NO GROWTH < 24 HOURS Performed at [facility], [place] [address], [place], [place] [address]    Report Status PENDING  Incomplete   Studies/Results: Dg Chest 1 View  Result Date: 05/16/2018 CLINICAL DATA:  Hypoxia EXAM: CHEST  1 VIEW COMPARISON:  CT abdomen 05/15/2018 FINDINGS: Bilateral lower lobe hazy airspace disease likely reflecting atelectasis. No right pleural effusion. Possible trace left pleural effusion. No pneumothorax. Normal heart size. Right  perihilar opacity which may reflect adenopathy. No aggressive osseous lesion. IMPRESSION: 1. Bibasilar airspace disease likely reflecting atelectasis. 2. Right perihilar opacity which may reflect adenopathy. If there is further clinical concern, recommend a CT of the chest. Electronically Signed   By: Kathreen Devoid   On: 05/16/2018 12:56   Dg C-arm 1-60 Min-no Report  Result Date: 05/16/2018 Fluoroscopy was utilized by the requesting physician.  No radiographic interpretation.   Ct Image Guided Drainage By Percutaneous Catheter  Result Date: 05/16/2018 INDICATION: Acute cholecystitis EXAM: CT-GUIDED CHOLECYSTOSTOMY TUBE PLACEMENT MEDICATIONS: None ANESTHESIA/SEDATION: Moderate (conscious) sedation was employed during this procedure. A total of Versed 2 mg and Fentanyl 100 mcg was administered intravenously. Moderate Sedation Time: 10 minutes. The patient's level of consciousness and vital signs were monitored continuously by radiology nursing throughout the procedure under my direct supervision. FLUOROSCOPY TIME:  None COMPLICATIONS: None immediate. PROCEDURE: Informed written consent was obtained from the patient after a thorough discussion of the procedural risks, benefits and alternatives. All questions were addressed. Maximal Sterile Barrier Technique was utilized including caps, mask, sterile gowns, sterile gloves, sterile drape, hand hygiene and skin antiseptic. A timeout was performed prior to the initiation of the procedure. The right upper quadrant was prepped and draped in a sterile fashion. 1% lidocaine was utilized for local anesthesia. Under CT guidance, an 18 gauge needle was advanced into the gallbladder lumen and removed over an Amplatz wire. Ten [place] dilator followed by a 10 [place] drain were inserted. It was looped and string fixed then sewn to the skin. Bilious fluid was aspirated. FINDINGS: Imaging confirms placement of a 10 French drain coiled in the lumen of the gallbladder.  IMPRESSION: Successful 10 French cholecystostomy tube placement. Electronically Signed   By: Marybelle Killings M.D.   On: 05/16/2018 15:57   Medications: I have reviewed the patient's current medications. Scheduled Meds:  atorvastatin  40 mg Oral Daily   Chlorhexidine Gluconate Cloth  6 each Topical Q0600   enoxaparin (LOVENOX) injection  40 mg Subcutaneous Q24H   indomethacin  100 mg Rectal Once   insulin aspart  0-15 Units Subcutaneous TID WC   insulin aspart  0-5 Units Subcutaneous QHS   mupirocin ointment  1 application Nasal BID   sodium chloride flush  3 mL Intravenous Q12H   sodium chloride flush  5 mL Intracatheter Q8H   Continuous Infusions:  ampicillin-sulbactam (UNASYN) IV Stopped (05/17/18 0940)   lactated ringers 100 mL/hr at 05/17/18 1244   PRN Meds:.acetaminophen **OR** acetaminophen, albuterol, HYDROcodone-acetaminophen, morphine injection, ondansetron **OR** ondansetron (ZOFRAN) IV, polyethylene glycol   Assessment: Active Problems:   Choledocholithiasis   Elevated liver enzymes   Abnormal CT of the abdomen   Duodenal ulceration    Plan: This patient had an ERCP without any stones found today appetite  stenosis which was treated with sphincterotomy.  The postprocedure drainage was good but the patient's enzymes increased today.  This may be due to edema of the ampulla after sphincterotomy versus a possible stone migration from the gallbladder to the common bile duct.  The latter is less likely since a 15 mm balloon was used to dredge the common bile duct and easily came out of the bile duct through the sphincterotomy.  I would continue to follow the patient's bilirubin and LFTs and expect them to decrease over time.   LOS: 2 days   Brent [name] 05/17/2018, 2:37 PM

## 2018-05-17 NOTE — Progress Notes (Signed)
[facility] NAME: Brent [name]    MR#:  [id number]  DATE OF BIRTH:  08-24-1945  SUBJECTIVE:   Patient states that his abdominal pain has improved today.  He denies any shortness of breath, cough, fevers.  He remains on 2 L O2.  REVIEW OF SYSTEMS:    Review of Systems  Constitutional: Positive for malaise/fatigue. Negative for chills and fever.  HENT: Negative for sore throat.   Eyes: Negative for blurred vision, double vision and pain.  Respiratory: Negative for cough, hemoptysis, shortness of breath and wheezing.   Cardiovascular: Negative for chest pain, palpitations, orthopnea and leg swelling.  Gastrointestinal: Positive for abdominal pain and nausea. Negative for constipation, diarrhea, heartburn and vomiting.  Genitourinary: Negative for dysuria and hematuria.  Musculoskeletal: Negative for back pain and joint pain.  Skin: Negative for rash.  Neurological: Negative for sensory change, speech change, focal weakness and headaches.  Endo/Heme/Allergies: Does not bruise/bleed easily.  Psychiatric/Behavioral: Negative for depression. The patient is not nervous/anxious.     DRUG ALLERGIES:  No Known Allergies  VITALS:  Blood pressure (!) 117/58, pulse 83, temperature 98.5 F (36.9 C), temperature source Oral, resp. rate 16, height 5\' 8"  (1.727 m), weight 104.3 kg, SpO2 93 %.  PHYSICAL EXAMINATION:   Physical Exam  GENERAL:  73 y.o.-year-old patient lying in the bed, in no acute distress EYES: Pupils equal, round, reactive to light and accommodation. No scleral icterus. Extraocular muscles intact.  HEENT: Head atraumatic, normocephalic. Oropharynx and nasopharynx clear.  NECK:  Supple, no jugular venous distention. No thyroid enlargement, no tenderness.  LUNGS: Normal breath sounds bilaterally, no wheezing, rales, rhonchi. No use of accessory muscles of respiration.  CARDIOVASCULAR: S1, S2 normal. No murmurs, rubs, or gallops.   ABDOMEN: Soft. Bowel sounds present. No organomegaly or mass. + Right upper quadrant tenderness to palpation, no rebound or guarding. Cholecystostomy tube in place. + Abdominal distention. EXTREMITIES: No cyanosis, clubbing or edema b/l.    NEUROLOGIC: Cranial nerves II through XII are intact. No focal Motor or sensory deficits b/l.   PSYCHIATRIC: The patient is alert and oriented x 3.  SKIN: No obvious rash, lesion, or ulcer.   LABORATORY PANEL:   CBC Recent Labs  Lab 05/17/18 0415  WBC 30.7*  HGB 12.8*  HCT 38.0*  PLT 398   ------------------------------------------------------------------------------------------------------------------ Chemistries  Recent Labs  Lab 05/17/18 0415  NA 138  K 4.1  CL 102  CO2 24  GLUCOSE 193*  BUN 63*  CREATININE 2.19*  CALCIUM 8.4*  AST 188*  ALT 119*  ALKPHOS 102  BILITOT 3.3*   ------------------------------------------------------------------------------------------------------------------  Cardiac Enzymes No results for input(s): TROPONINI in the last 168 hours. ------------------------------------------------------------------------------------------------------------------  RADIOLOGY:  Dg Chest 1 View  Result Date: 05/16/2018 CLINICAL DATA:  Hypoxia EXAM: CHEST  1 VIEW COMPARISON:  CT abdomen 05/15/2018 FINDINGS: Bilateral lower lobe hazy airspace disease likely reflecting atelectasis. No right pleural effusion. Possible trace left pleural effusion. No pneumothorax. Normal heart size. Right perihilar opacity which may reflect adenopathy. No aggressive osseous lesion. IMPRESSION: 1. Bibasilar airspace disease likely reflecting atelectasis. 2. Right perihilar opacity which may reflect adenopathy. If there is further clinical concern, recommend a CT of the chest. Electronically Signed   By: Kathreen Devoid   On: 05/16/2018 12:56   Dg C-arm 1-60 Min-no Report  Result Date: 05/16/2018 Fluoroscopy was utilized by the requesting  physician.  No radiographic interpretation.   Ct Image Guided Drainage By Percutaneous Catheter  Result Date: 05/16/2018 INDICATION: Acute cholecystitis EXAM: CT-GUIDED CHOLECYSTOSTOMY TUBE PLACEMENT MEDICATIONS: None ANESTHESIA/SEDATION: Moderate (conscious) sedation was employed during this procedure. A total of Versed 2 mg and Fentanyl 100 mcg was administered intravenously. Moderate Sedation Time: 10 minutes. The patient's level of consciousness and vital signs were monitored continuously by radiology nursing throughout the procedure under my direct supervision. FLUOROSCOPY TIME:  None COMPLICATIONS: None immediate. PROCEDURE: Informed written consent was obtained from the patient after a thorough discussion of the procedural risks, benefits and alternatives. All questions were addressed. Maximal Sterile Barrier Technique was utilized including caps, mask, sterile gowns, sterile gloves, sterile drape, hand hygiene and skin antiseptic. A timeout was performed prior to the initiation of the procedure. The right upper quadrant was prepped and draped in a sterile fashion. 1% lidocaine was utilized for local anesthesia. Under CT guidance, an 18 gauge needle was advanced into the gallbladder lumen and removed over an Amplatz wire. Ten [place] dilator followed by a 10 [place] drain were inserted. It was looped and string fixed then sewn to the skin. Bilious fluid was aspirated. FINDINGS: Imaging confirms placement of a 10 French drain coiled in the lumen of the gallbladder. IMPRESSION: Successful 10 French cholecystostomy tube placement. Electronically Signed   By: Marybelle Killings M.D.   On: 05/16/2018 15:57     ASSESSMENT AND PLAN:   Sepsis secondary to acute cholecystitis-sepsis resolving.  Leukocytosis improving.  AST, ALT, bilirubin have all increased. -s/p ERCP with sphincterotomy on 4/10- no stones found -S/p cholecystostomy tube placement 4/10 -Continue IV Unasyn -Blood cultures with no growth to  date -Recheck CMP in the morning -GI and surgery following  Acute hypoxic respiratory failure- likely atelectasis in the setting of abdominal distention.   -Wean O2 as able -Incentive spirometry  AKI- creatinine bumped from 1.48 > 2.19 this morning.  Likely due to poor p.o. intake, hypotension, sepsis. -Hold lisinopril -Continue IV fluids  Hypertension-blood pressures have been soft in the setting of sepsis -Continue holding home BP meds  Type II diabetes mellitus-blood sugars elevated -A1c 7.2 this admission -Continue SSI  DVT prophylaxis with Lovenox  All the records are reviewed and case discussed with Care Management/Social Worker. Management plans discussed with the patient, family and they are in agreement.  CODE STATUS: FULL CODE  DVT Prophylaxis: SCDs  TOTAL TIME TAKING CARE OF THIS PATIENT: 35 minutes.   POSSIBLE D/C IN 1-2 DAYS, DEPENDING ON CLINICAL CONDITION.  Berna Spare Billyjack Trompeter M.D on 05/17/2018 at 2:54 PM  Between 7am to 6pm - Pager - [phone]  After 6pm go to [email or web address] - password [email or web address] [facility]  Office  [phone]  CC: Primary care physician; [name], MD  Note: This dictation was prepared with Dragon dictation along with smaller phrase technology. Any transcriptional errors that result from this process are unintentional.

## 2018-05-17 NOTE — Progress Notes (Signed)
CC: Acute cholecystitis w cholestasis  Subjective: S/p ERCP no CBD stones found, ampullary stenosis s/p sphincterotomy. S/p Perc chole tube Feels better.  Right upper quadrant pain much improved 10 cc out of the drain with toleratedclear liquids WBC improving I personally reviewed his CT scan and ERCP no obvious choledocholithiasis or obstructive process Creat 2.1 TB 3.3 with mild transaminitis  Objective:Vital signs in last 24 hours: Temp:  [98.4 F (36.9 C)-99.5 F (37.5 C)] 98.4 F (36.9 C) (04/11 0500) Pulse Rate:  [74-96] 96 (04/11 0500) Resp:  [11-32] 16 (04/11 0500) BP: (104-160)/(51-98) 104/51 (04/11 0500) SpO2:  [90 %-96 %] 94 % (04/11 0500) Last BM Date: 05/15/18  Intake/Output from previous day: 04/10 0701 - 04/11 0700 In: 1495.7 [I.V.:1040.6; IV Piggyback:300.1] Out: 360 [Urine:350; Drains:10] Intake/Output this shift: Total I/O In: 240 [P.O.:240] Out: -   Physical exam: NAD, awake and alert Abd: soft, mild TTP no peritonitis and no murphy sign. Chole tube in place. Dark bile.  Ext: well perfused and no edema Neuro: GCS 15, no motor or sens deficits. Intact thought process   Lab Results: CBC  Recent Labs    05/16/18 0406 05/17/18 0415  WBC 43.3* 30.7*  HGB 14.8 12.8*  HCT 43.9 38.0*  PLT 428* 398   BMET Recent Labs    05/16/18 0406 05/17/18 0415  NA 138 138  K 4.3 4.1  CL 105 102  CO2 23 24  GLUCOSE 194* 193*  BUN 37* 63*  CREATININE 1.48* 2.19*  CALCIUM 8.8* 8.4*   PT/INR Recent Labs    05/16/18 0406  LABPROT 17.0*  INR 1.4*   ABG No results for input(s): PHART, HCO3 in the last 72 hours.  Invalid input(s): PCO2, PO2  Studies/Results: Dg Chest 1 View  Result Date: 05/16/2018 CLINICAL DATA:  Hypoxia EXAM: CHEST  1 VIEW COMPARISON:  CT abdomen 05/15/2018 FINDINGS: Bilateral lower lobe hazy airspace disease likely reflecting atelectasis. No right pleural effusion. Possible trace left pleural effusion. No pneumothorax. Normal  heart size. Right perihilar opacity which may reflect adenopathy. No aggressive osseous lesion. IMPRESSION: 1. Bibasilar airspace disease likely reflecting atelectasis. 2. Right perihilar opacity which may reflect adenopathy. If there is further clinical concern, recommend a CT of the chest. Electronically Signed   By: Kathreen Devoid   On: 05/16/2018 12:56   Dg C-arm 1-60 Min-no Report  Result Date: 05/16/2018 Fluoroscopy was utilized by the requesting physician.  No radiographic interpretation.   Ct Image Guided Drainage By Percutaneous Catheter  Result Date: 05/16/2018 INDICATION: Acute cholecystitis EXAM: CT-GUIDED CHOLECYSTOSTOMY TUBE PLACEMENT MEDICATIONS: None ANESTHESIA/SEDATION: Moderate (conscious) sedation was employed during this procedure. A total of Versed 2 mg and Fentanyl 100 mcg was administered intravenously. Moderate Sedation Time: 10 minutes. The patient's level of consciousness and vital signs were monitored continuously by radiology nursing throughout the procedure under my direct supervision. FLUOROSCOPY TIME:  None COMPLICATIONS: None immediate. PROCEDURE: Informed written consent was obtained from the patient after a thorough discussion of the procedural risks, benefits and alternatives. All questions were addressed. Maximal Sterile Barrier Technique was utilized including caps, mask, sterile gowns, sterile gloves, sterile drape, hand hygiene and skin antiseptic. A timeout was performed prior to the initiation of the procedure. The right upper quadrant was prepped and draped in a sterile fashion. 1% lidocaine was utilized for local anesthesia. Under CT guidance, an 18 gauge needle was advanced into the gallbladder lumen and removed over an Amplatz wire. Ten [place] dilator followed by a 10 [place] drain were  inserted. It was looped and string fixed then sewn to the skin. Bilious fluid was aspirated. FINDINGS: Imaging confirms placement of a 10 French drain coiled in the lumen of the  gallbladder. IMPRESSION: Successful 10 French cholecystostomy tube placement. Electronically Signed   By: Marybelle Killings M.D.   On: 05/16/2018 15:57    Anti-infectives: unasyn  Assessment/Plan: 73 year old male with acute cholecystitis and questionable  choledocholithiasis not seen on ERCP.  Other etiologies include some cholestasis. We will  Obtain a fractionated bilirubin tomorrow.  Recommend slowly advancing the diet.  Continuation of antibiotic. He Is not quite ready to go home. Need for emergent surgical intervention.  Please note that I spent more than 35 minutes in this encounter with greater than 50% spent in coordination and counseling of his care  Caroleen Hamman, MD, [facility]  05/17/2018

## 2018-05-18 LAB — CBC
HCT: 39.4 % (ref 39.0–52.0)
Hemoglobin: 13.1 g/dL (ref 13.0–17.0)
MCH: 30.2 pg (ref 26.0–34.0)
MCHC: 33.2 g/dL (ref 30.0–36.0)
MCV: 90.8 fL (ref 80.0–100.0)
Platelets: 478 10*3/uL — ABNORMAL HIGH (ref 150–400)
RBC: 4.34 MIL/uL (ref 4.22–5.81)
RDW: 13.2 % (ref 11.5–15.5)
WBC: 17.5 10*3/uL — ABNORMAL HIGH (ref 4.0–10.5)
nRBC: 0 % (ref 0.0–0.2)

## 2018-05-18 LAB — COMPREHENSIVE METABOLIC PANEL
ALT: 142 U/L — ABNORMAL HIGH (ref 0–44)
AST: 193 U/L — ABNORMAL HIGH (ref 15–41)
Albumin: 2.7 g/dL — ABNORMAL LOW (ref 3.5–5.0)
Alkaline Phosphatase: 125 U/L (ref 38–126)
Anion gap: 10 (ref 5–15)
BUN: 62 mg/dL — ABNORMAL HIGH (ref 8–23)
CO2: 25 mmol/L (ref 22–32)
Calcium: 8.2 mg/dL — ABNORMAL LOW (ref 8.9–10.3)
Chloride: 104 mmol/L (ref 98–111)
Creatinine, Ser: 1.85 mg/dL — ABNORMAL HIGH (ref 0.61–1.24)
GFR calc Af Amer: 42 mL/min — ABNORMAL LOW (ref >60)
GFR calc non Af Amer: 36 mL/min — ABNORMAL LOW (ref >60)
Glucose, Bld: 267 mg/dL — ABNORMAL HIGH (ref 70–99)
Potassium: 4 mmol/L (ref 3.5–5.1)
Sodium: 139 mmol/L (ref 135–145)
Total Bilirubin: 3.1 mg/dL — ABNORMAL HIGH (ref 0.3–1.2)
Total Protein: 6.7 g/dL (ref 6.5–8.1)

## 2018-05-18 LAB — GLUCOSE, CAPILLARY
Glucose-Capillary: 265 mg/dL — ABNORMAL HIGH (ref 70–99)
Glucose-Capillary: 281 mg/dL — ABNORMAL HIGH (ref 70–99)
Glucose-Capillary: 231 mg/dL — ABNORMAL HIGH (ref 70–99)
Glucose-Capillary: 193 mg/dL — ABNORMAL HIGH (ref 70–99)

## 2018-05-18 LAB — C DIFFICILE QUICK SCREEN W PCR REFLEX
C Diff antigen: NEGATIVE
C Diff interpretation: NOT DETECTED

## 2018-05-18 LAB — BILIRUBIN, FRACTIONATED(TOT/DIR/INDIR)
Bilirubin, Direct: 2 mg/dL — ABNORMAL HIGH (ref 0.0–0.2)
Indirect Bilirubin: 0.9 mg/dL (ref 0.3–0.9)
Total Bilirubin: 2.9 mg/dL — ABNORMAL HIGH (ref 0.3–1.2)

## 2018-05-18 LAB — C DIFFICILE QUICK SCREEN W PCR REFLEX??: C Diff toxin: NEGATIVE

## 2018-05-18 MED ORDER — INSULIN ASPART 100 UNIT/ML ~~LOC~~ SOLN: 0.0000 [IU] | Freq: Every day | SUBCUTANEOUS | Status: DC

## 2018-05-18 MED ORDER — INSULIN ASPART 100 UNIT/ML ~~LOC~~ SOLN
0.0000 [IU] | Freq: Three times a day (TID) | SUBCUTANEOUS | Status: DC
  Administered 2018-05-18: 17:00:00 7 [IU] via SUBCUTANEOUS
  Administered 2018-05-19 (×2): 4 [IU] via SUBCUTANEOUS
  Administered 2018-05-19: 7 [IU] via SUBCUTANEOUS
  Administered 2018-05-20: 3 [IU] via SUBCUTANEOUS
  Filled 2018-05-18 (×5): qty 1

## 2018-05-18 NOTE — Progress Notes (Signed)
[facility] [place] NAME: Brent [name]    MR#:  [id number]  DATE OF BIRTH:  15-Jun-1945  SUBJECTIVE:   Patient states he is doing okay this morning.  His abdominal pain has improved.  He denies any fevers or chills.  REVIEW OF SYSTEMS:    Review of Systems  Constitutional: Positive for malaise/fatigue. Negative for chills and fever.  HENT: Negative for sore throat.   Eyes: Negative for blurred vision, double vision and pain.  Respiratory: Negative for cough, hemoptysis, shortness of breath and wheezing.   Cardiovascular: Negative for chest pain, palpitations, orthopnea and leg swelling.  Gastrointestinal: Positive for abdominal pain and nausea. Negative for constipation, diarrhea, heartburn and vomiting.  Genitourinary: Negative for dysuria and hematuria.  Musculoskeletal: Negative for back pain and joint pain.  Skin: Negative for rash.  Neurological: Negative for sensory change, speech change, focal weakness and headaches.  Endo/Heme/Allergies: Does not bruise/bleed easily.  Psychiatric/Behavioral: Negative for depression. The patient is not nervous/anxious.     DRUG ALLERGIES:  No Known Allergies  VITALS:  Blood pressure (!) 142/61, pulse 91, temperature 98.7 F (37.1 C), temperature source Oral, resp. rate 16, height 5\' 8"  (1.727 m), weight 104.3 kg, SpO2 92 %.  PHYSICAL EXAMINATION:   Physical Exam  GENERAL:  73 y.o.-year-old patient lying in the bed, in no acute distress EYES: Pupils equal, round, reactive to light and accommodation. No scleral icterus. Extraocular muscles intact.  HEENT: Head atraumatic, normocephalic. Oropharynx and nasopharynx clear.  NECK:  Supple, no jugular venous distention. No thyroid enlargement, no tenderness.  LUNGS: Normal breath sounds bilaterally, no wheezing, rales, rhonchi. No use of accessory muscles of respiration.  CARDIOVASCULAR: RRR, S1, S2 normal. No murmurs, rubs, or gallops.  ABDOMEN: Soft.  Bowel sounds present. No organomegaly or mass. + Mild right upper quadrant tenderness to palpation, no rebound or guarding. Cholecystostomy tube in place. + Abdominal distention. EXTREMITIES: No cyanosis, clubbing or edema b/l.    NEUROLOGIC: Cranial nerves II through XII are intact. No focal Motor or sensory deficits b/l.   PSYCHIATRIC: The patient is alert and oriented x 3.  SKIN: No obvious rash, lesion, or ulcer.   LABORATORY PANEL:   CBC Recent Labs  Lab 05/18/18 0442  WBC 17.5*  HGB 13.1  HCT 39.4  PLT 478*   ------------------------------------------------------------------------------------------------------------------ Chemistries  Recent Labs  Lab 05/18/18 0442  NA 139  K 4.0  CL 104  CO2 25  GLUCOSE 267*  BUN 62*  CREATININE 1.85*  CALCIUM 8.2*  AST 193*  ALT 142*  ALKPHOS 125  BILITOT 2.9*  3.1*   ------------------------------------------------------------------------------------------------------------------  Cardiac Enzymes No results for input(s): TROPONINI in the last 168 hours. ------------------------------------------------------------------------------------------------------------------  RADIOLOGY:  Ct Image Guided Drainage By Percutaneous Catheter  Result Date: 05/16/2018 INDICATION: Acute cholecystitis EXAM: CT-GUIDED CHOLECYSTOSTOMY TUBE PLACEMENT MEDICATIONS: None ANESTHESIA/SEDATION: Moderate (conscious) sedation was employed during this procedure. A total of Versed 2 mg and Fentanyl 100 mcg was administered intravenously. Moderate Sedation Time: 10 minutes. The patient's level of consciousness and vital signs were monitored continuously by radiology nursing throughout the procedure under my direct supervision. FLUOROSCOPY TIME:  None COMPLICATIONS: None immediate. PROCEDURE: Informed written consent was obtained from the patient after a thorough discussion of the procedural risks, benefits and alternatives. All questions were addressed. Maximal  Sterile Barrier Technique was utilized including caps, mask, sterile gowns, sterile gloves, sterile drape, hand hygiene and skin antiseptic. A timeout was performed prior to the initiation of  the procedure. The right upper quadrant was prepped and draped in a sterile fashion. 1% lidocaine was utilized for local anesthesia. Under CT guidance, an 18 gauge needle was advanced into the gallbladder lumen and removed over an Amplatz wire. Ten [place] dilator followed by a 10 [place] drain were inserted. It was looped and string fixed then sewn to the skin. Bilious fluid was aspirated. FINDINGS: Imaging confirms placement of a 10 French drain coiled in the lumen of the gallbladder. IMPRESSION: Successful 10 French cholecystostomy tube placement. Electronically Signed   By: Marybelle Killings M.D.   On: 05/16/2018 15:57     ASSESSMENT AND PLAN:   Sepsis secondary to acute cholecystitis-sepsis resolving.  Leukocytosis has greatly improved today.  AST, ALT, bilirubin continue to rise. -s/p ERCP with sphincterotomy on 4/10- no stones found -S/p cholecystostomy tube placement 4/10 -Continue IV Unasyn -GI and surgery following -Will obtain right upper quadrant ultrasound to assess ducts for any dilatation, given his persistent hyperbilirubinemia -Will consider ordering cholangiogram tomorrow- IR is unable to do this today. -Recheck CMP in the morning  Acute hypoxic respiratory failure- likely atelectasis in the setting of abdominal distention.  Remains on 2 L O2. -Wean O2 as able -Incentive spirometry  AKI- improving with fluids. -Hold lisinopril -Continue IV fluids  Hypertension-blood pressures have been soft in the setting of sepsis -Continue holding home BP meds  Type II diabetes mellitus-blood sugars have been elevated in the setting of infection. -A1c 7.2 this admission -Increase to resistant SSI  DVT prophylaxis with Lovenox  All the records are reviewed and case discussed with Care  Management/Social Worker. Management plans discussed with the patient, family and they are in agreement.  CODE STATUS: FULL CODE  DVT Prophylaxis: SCDs  TOTAL TIME TAKING CARE OF THIS PATIENT: 40 minutes.   POSSIBLE D/C IN 1-2 DAYS, DEPENDING ON CLINICAL CONDITION.  Berna Spare Mayo M.D on 05/18/2018 at 1:22 PM  Between 7am to 6pm - Pager - [phone]  After 6pm go to [email or web address] - password [email or web address] [facility]  Office  [phone]  CC: Primary care physician; [name], MD  Note: This dictation was prepared with Dragon dictation along with smaller phrase technology. Any transcriptional errors that result from this process are unintentional.

## 2018-05-18 NOTE — Progress Notes (Signed)
CC: Acute cholecystitis w cholestasis  Subjective: S/p ERCP no CBD stones found, ampullary stenosis s/p sphincterotomy. S/p Perc chole tube continues to improve and no abd pain today 40 cc out of the drain  Tolerated clear liquids WBC improving now back to baseline Persistent hyperbilirubinemia  Objective: Vital signs in last 24 hours: Temp:  [98.5 F (36.9 C)-99.8 F (37.7 C)] 98.8 F (37.1 C) (04/12 0537) Pulse Rate:  [83-104] 97 (04/12 0537) Resp:  [16-20] 18 (04/12 0537) BP: (84-146)/(26-70) 146/70 (04/12 0537) SpO2:  [93 %-95 %] 95 % (04/12 0537) Last BM Date: 05/15/18  Intake/Output from previous day: 04/11 0701 - 04/12 0700 In: 2334.5 [P.O.:840; I.V.:1186.3; IV Piggyback:298.2] Out: 840 [Urine:800; Drains:40] Intake/Output this shift: Total I/O In: 5 [Other:5] Out: 650 [Urine:650]  Physical exam: NAD, awake and alert Abd: soft, mild TTP no peritonitis and no murphy sign. Chole tube in place. Dark bile.  Ext: well perfused and no edema Neuro: GCS 15, no motor or sens deficits. No encephalopathy    Lab Results: CBC  Recent Labs    05/17/18 0415 05/18/18 0442  WBC 30.7* 17.5*  HGB 12.8* 13.1  HCT 38.0* 39.4  PLT 398 478*   BMET Recent Labs    05/17/18 0415 05/18/18 0442  NA 138 139  K 4.1 4.0  CL 102 104  CO2 24 25  GLUCOSE 193* 267*  BUN 63* 62*  CREATININE 2.19* 1.85*  CALCIUM 8.4* 8.2*   PT/INR Recent Labs    05/16/18 0406  LABPROT 17.0*  INR 1.4*   ABG No results for input(s): PHART, HCO3 in the last 72 hours.  Invalid input(s): PCO2, PO2  Studies/Results: Dg Chest 1 View  Result Date: 05/16/2018 CLINICAL DATA:  Hypoxia EXAM: CHEST  1 VIEW COMPARISON:  CT abdomen 05/15/2018 FINDINGS: Bilateral lower lobe hazy airspace disease likely reflecting atelectasis. No right pleural effusion. Possible trace left pleural effusion. No pneumothorax. Normal heart size. Right perihilar opacity which may reflect adenopathy. No aggressive  osseous lesion. IMPRESSION: 1. Bibasilar airspace disease likely reflecting atelectasis. 2. Right perihilar opacity which may reflect adenopathy. If there is further clinical concern, recommend a CT of the chest. Electronically Signed   By: Kathreen Devoid   On: 05/16/2018 12:56   Dg C-arm 1-60 Min-no Report  Result Date: 05/16/2018 Fluoroscopy was utilized by the requesting physician.  No radiographic interpretation.   Ct Image Guided Drainage By Percutaneous Catheter  Result Date: 05/16/2018 INDICATION: Acute cholecystitis EXAM: CT-GUIDED CHOLECYSTOSTOMY TUBE PLACEMENT MEDICATIONS: None ANESTHESIA/SEDATION: Moderate (conscious) sedation was employed during this procedure. A total of Versed 2 mg and Fentanyl 100 mcg was administered intravenously. Moderate Sedation Time: 10 minutes. The patient's level of consciousness and vital signs were monitored continuously by radiology nursing throughout the procedure under my direct supervision. FLUOROSCOPY TIME:  None COMPLICATIONS: None immediate. PROCEDURE: Informed written consent was obtained from the patient after a thorough discussion of the procedural risks, benefits and alternatives. All questions were addressed. Maximal Sterile Barrier Technique was utilized including caps, mask, sterile gowns, sterile gloves, sterile drape, hand hygiene and skin antiseptic. A timeout was performed prior to the initiation of the procedure. The right upper quadrant was prepped and draped in a sterile fashion. 1% lidocaine was utilized for local anesthesia. Under CT guidance, an 18 gauge needle was advanced into the gallbladder lumen and removed over an Amplatz wire. Ten [place] dilator followed by a 10 [place] drain were inserted. It was looped and string fixed then sewn to the skin. Bilious  fluid was aspirated. FINDINGS: Imaging confirms placement of a 10 French drain coiled in the lumen of the gallbladder. IMPRESSION: Successful 10 French cholecystostomy tube placement.  Electronically Signed   By: Marybelle Killings M.D.   On: 05/16/2018 15:57    Anti-infectives: Anti-infectives (From admission, onward)   Start     Dose/Rate Route Frequency Ordered Stop   05/15/18 1600  Ampicillin-Sulbactam (UNASYN) 3 g in sodium chloride 0.9 % 100 mL IVPB     3 g 200 mL/hr over 30 Minutes Intravenous Every 6 hours 05/15/18 1040     05/15/18 1000  piperacillin-tazobactam (ZOSYN) IVPB 3.375 g     3.375 g 100 mL/hr over 30 Minutes Intravenous  Once 05/15/18 0945 05/15/18 1007      Assessment/Plan: Persistent hyperbilirubinemia raises the concern for persistent choledocholithiasis. Dr. Allen Norris following and aware of the situation  We will defer to him regardind the need to further investigate the CBD w either MRCP or repeat ERCP Clinically improving and in no need for emergent surgical intervention  Caroleen Hamman, MD, [facility]  05/18/2018

## 2018-05-19 ENCOUNTER — Encounter: Payer: Self-pay | Admitting: Gastroenterology

## 2018-05-19 ENCOUNTER — Inpatient Hospital Stay: Payer: Medicare Other

## 2018-05-19 LAB — CBC
HCT: 39.5 % (ref 39.0–52.0)
Hemoglobin: 13.2 g/dL (ref 13.0–17.0)
MCH: 30.3 pg (ref 26.0–34.0)
MCHC: 33.4 g/dL (ref 30.0–36.0)
MCV: 90.8 fL (ref 80.0–100.0)
Platelets: 514 10*3/uL — ABNORMAL HIGH (ref 150–400)
RBC: 4.35 MIL/uL (ref 4.22–5.81)
RDW: 13.4 % (ref 11.5–15.5)
WBC: 19.4 10*3/uL — ABNORMAL HIGH (ref 4.0–10.5)
nRBC: 0 % (ref 0.0–0.2)

## 2018-05-19 LAB — COMPREHENSIVE METABOLIC PANEL
ALT: 113 U/L — ABNORMAL HIGH (ref 0–44)
AST: 117 U/L — ABNORMAL HIGH (ref 15–41)
Albumin: 2.6 g/dL — ABNORMAL LOW (ref 3.5–5.0)
Alkaline Phosphatase: 116 U/L (ref 38–126)
Anion gap: 7 (ref 5–15)
BUN: 35 mg/dL — ABNORMAL HIGH (ref 8–23)
CO2: 26 mmol/L (ref 22–32)
Calcium: 7.9 mg/dL — ABNORMAL LOW (ref 8.9–10.3)
Chloride: 107 mmol/L (ref 98–111)
Creatinine, Ser: 1.13 mg/dL (ref 0.61–1.24)
GFR calc Af Amer: >60 mL/min (ref >60)
GFR calc non Af Amer: >60 mL/min (ref >60)
Glucose, Bld: 206 mg/dL — ABNORMAL HIGH (ref 70–99)
Potassium: 3 mmol/L — ABNORMAL LOW (ref 3.5–5.1)
Sodium: 140 mmol/L (ref 135–145)
Total Bilirubin: 1.4 mg/dL — ABNORMAL HIGH (ref 0.3–1.2)
Total Protein: 6.5 g/dL (ref 6.5–8.1)

## 2018-05-19 LAB — GASTROINTESTINAL PANEL BY PCR, STOOL (REPLACES STOOL CULTURE)

## 2018-05-19 LAB — GLUCOSE, CAPILLARY
Glucose-Capillary: 187 mg/dL — ABNORMAL HIGH (ref 70–99)
Glucose-Capillary: 180 mg/dL — ABNORMAL HIGH (ref 70–99)
Glucose-Capillary: 233 mg/dL — ABNORMAL HIGH (ref 70–99)
Glucose-Capillary: 134 mg/dL — ABNORMAL HIGH (ref 70–99)

## 2018-05-19 LAB — OCCULT BLOOD X 1 CARD TO LAB, STOOL: Fecal Occult Bld: NEGATIVE

## 2018-05-19 MED ORDER — POTASSIUM CHLORIDE 10 MEQ/100ML IV SOLN
10.0000 meq | INTRAVENOUS | Status: AC
  Administered 2018-05-19 (×4): 10 meq via INTRAVENOUS
  Filled 2018-05-19 (×4): qty 100

## 2018-05-19 MED ORDER — LOPERAMIDE HCL 2 MG PO CAPS
2.0000 mg | ORAL_CAPSULE | ORAL | Status: DC | PRN
  Administered 2018-05-20: 2 mg via ORAL
  Filled 2018-05-19: qty 1

## 2018-05-19 MED ORDER — LISINOPRIL 20 MG PO TABS
20.0000 mg | ORAL_TABLET | Freq: Every day | ORAL | Status: DC
  Administered 2018-05-19 – 2018-05-20 (×2): 20 mg via ORAL
  Filled 2018-05-19 (×2): qty 1

## 2018-05-19 MED ORDER — ASPIRIN EC 81 MG PO TBEC
81.0000 mg | DELAYED_RELEASE_TABLET | Freq: Every day | ORAL | Status: DC
  Administered 2018-05-19 – 2018-05-20 (×2): 81 mg via ORAL
  Filled 2018-05-19 (×2): qty 1

## 2018-05-19 MED ORDER — LOPERAMIDE HCL 2 MG PO CAPS
2.0000 mg | ORAL_CAPSULE | Freq: Once | ORAL | Status: AC
  Administered 2018-05-19: 08:00:00 2 mg via ORAL
  Filled 2018-05-19: qty 1

## 2018-05-19 NOTE — Progress Notes (Signed)
[facility] Hospital Day(s): 4.   Post op day(s): 3 Days Post-Op.   Interval History: Patient seen and examined, no acute events or new complaints overnight. Patient reports having no abdominal pain.  Denies fever or chills.  Denies any problem tolerating diet.  Vital signs in last 24 hours: [min-max] current  Temp:  [98.5 F (36.9 C)-99 F (37.2 C)] 98.5 F (36.9 C) (04/13 0507) Pulse Rate:  [76-91] 76 (04/13 0507) Resp:  [16-20] 20 (04/13 0507) BP: (142-165)/(61-80) 151/77 (04/13 0507) SpO2:  [92 %-97 %] 97 % (04/13 1032)     Height: 5\' 8"  (172.7 cm) Weight: 104.3 kg BMI (Calculated): 34.98   Cholecystostomy: 50 mL  Physical Exam:  Constitutional: alert, cooperative and no distress  Respiratory: breathing non-labored at rest  Cardiovascular: regular rate and sinus rhythm  Gastrointestinal: soft, non-tender, and non-distended.  Right-sided cholecystostomy tube in place with bilious output.  Labs:  CBC Latest Ref Rng & Units 05/19/2018 05/18/2018 05/17/2018  WBC 4.0 - 10.5 K/uL 19.4(H) 17.5(H) 30.7(H)  Hemoglobin 13.0 - 17.0 g/dL 13.2 13.1 12.8(L)  Hematocrit 39.0 - 52.0 % 39.5 39.4 38.0(L)  Platelets 150 - 400 K/uL 514(H) 478(H) 398   CMP Latest Ref Rng & Units 05/19/2018 05/18/2018 05/18/2018  Glucose 70 - 99 mg/dL 206(H) - 267(H)  BUN 8 - 23 mg/dL 35(H) - 62(H)  Creatinine 0.61 - 1.24 mg/dL 1.13 - 1.85(H)  Sodium 135 - 145 mmol/L 140 - 139  Potassium 3.5 - 5.1 mmol/L 3.0(L) - 4.0  Chloride 98 - 111 mmol/L 107 - 104  CO2 22 - 32 mmol/L 26 - 25  Calcium 8.9 - 10.3 mg/dL 7.9(L) - 8.2(L)  Total Protein 6.5 - 8.1 g/dL 6.5 - 6.7  Total Bilirubin 0.3 - 1.2 mg/dL 1.4(H) 2.9(H) 3.1(H)  Alkaline Phos 38 - 126 U/L 116 - 125  AST 15 - 41 U/L 117(H) - 193(H)  ALT 0 - 44 U/L 113(H) - 142(H)    Imaging studies: New abdominal ultrasound images personally evaluated.  There is persistent but was weakening with improved edema.  No sign of biliary duct  dilation.   Assessment/Plan:  73 y.o.malewith cholecystitis and initial suspicion of choledocholithiasis s/p ERCP without finding of retained common bile duct stone, complicated by pertinent comorbidities includinghypertension and diabetes.  Today patient with improved abdominal pain.  He tolerated diet yesterday.  Abdominal ultrasound was ordered due to the persistent elevation of bilirubin.  There is no sign of choledocholithiasis on ultrasound.  Today bilirubin had a significant improvement.  There is also improvement of renal function.  White blood cell count today is 19,000 which is not far from his baseline and significantly improved from admission leukocytosis.  From surgical standpoint patient responding well to gallbladder drainage with cholecystostomy.  Clinically he has resolved abdominal pain, leukocytosis to baseline, no fever and no tachycardia and bile drainage is getting more clear.  Hospitalist/GI are considering doing a cholangiogram.    From surgical standpoint if cholangiogram is needed, patient can start trial of soft diet and I will assess for toleration.  Agreed to continue antibiotic therapy.  Will follow  Arnold Long, MD

## 2018-05-19 NOTE — Consult Note (Signed)
Pharmacy Antibiotic Note  Brent [name]. is a 73 y.o. male admitted on 05/15/2018 with intra-abdominal infection.  Leukocytosis beginning to resolve.    Pharmacy has been consulted for Unasyn dosing.  Plan: Continue Unasyn IV 3g q6h  Height: 5\' 8"  (172.7 cm) Weight: 230 lb (104.3 kg) IBW/kg (Calculated) : 68.4  Temp (24hrs), Avg:98.7 F (37.1 C), Min:98.5 F (36.9 C), Max:99 F (37.2 C)  Recent Labs  Lab 05/15/18 0818 05/16/18 0406 05/17/18 0415 05/18/18 0442 05/19/18 0451  WBC 44.1* 43.3* 30.7* 17.5* 19.4*  CREATININE 1.24 1.48* 2.19* 1.85* 1.13    Estimated Creatinine Clearance: 70.2 mL/min (by C-G formula based on SCr of 1.13 mg/dL).    No Known Allergies  Antimicrobials this admission: Zosyn 4/9 x 1 Unasyn 4/9 >>   Dose adjustments this admission: None  Microbiology results: 4/9 BCx: NGTD  Thank you for allowing pharmacy to be a part of this patient's care.  Lu Duffel, PharmD, BCPS Clinical Pharmacist 05/19/2018 11:18 AM

## 2018-05-19 NOTE — Progress Notes (Signed)
Order received from Dr Brett Albino for a full liquid diet

## 2018-05-19 NOTE — Progress Notes (Addendum)
[facility] at [place] NAME: Brent [name]    MR#:  [id number]  DATE OF BIRTH:  03/23/1945  SUBJECTIVE:   Patient states he is doing fine this morning.  His abdominal pain has improved.  He denies any fevers or chills.  REVIEW OF SYSTEMS:    Review of Systems  Constitutional: Positive for malaise/fatigue. Negative for chills and fever.  HENT: Negative for sore throat.   Eyes: Negative for blurred vision, double vision and pain.  Respiratory: Negative for cough, hemoptysis, shortness of breath and wheezing.   Cardiovascular: Negative for chest pain, palpitations, orthopnea and leg swelling.  Gastrointestinal: Negative for abdominal pain, constipation, diarrhea, heartburn, nausea and vomiting.  Genitourinary: Negative for dysuria and hematuria.  Musculoskeletal: Negative for back pain and joint pain.  Skin: Negative for rash.  Neurological: Negative for sensory change, speech change, focal weakness and headaches.  Endo/Heme/Allergies: Does not bruise/bleed easily.  Psychiatric/Behavioral: Negative for depression. The patient is not nervous/anxious.     DRUG ALLERGIES:  No Known Allergies  VITALS:  Blood pressure (!) 164/87, pulse 84, temperature 98.7 F (37.1 C), temperature source Oral, resp. rate 20, height 5\' 8"  (1.727 m), weight 104.3 kg, SpO2 96 %.  PHYSICAL EXAMINATION:   Physical Exam  GENERAL:  73 y.o.-year-old patient lying in the bed, in no acute distress EYES: Pupils equal, round, reactive to light and accommodation. No scleral icterus. Extraocular muscles intact.  HEENT: Head atraumatic, normocephalic. Oropharynx and nasopharynx clear.  NECK:  Supple, no jugular venous distention. No thyroid enlargement, no tenderness.  LUNGS: Normal breath sounds bilaterally, no wheezing, rales, rhonchi. No use of accessory muscles of respiration.  CARDIOVASCULAR: RRR, S1, S2 normal. No murmurs, rubs, or gallops.  ABDOMEN: Soft. Bowel sounds  present. No organomegaly or mass. + Mild right upper quadrant tenderness to palpation, no rebound or guarding. Cholecystostomy tube in place. + Abdominal distention. EXTREMITIES: No cyanosis, clubbing or edema b/l.    NEUROLOGIC: Cranial nerves II through XII are intact. No focal Motor or sensory deficits b/l.   PSYCHIATRIC: The patient is alert and oriented x 3.  SKIN: No obvious rash, lesion, or ulcer.   LABORATORY PANEL:   CBC Recent Labs  Lab 05/19/18 0451  WBC 19.4*  HGB 13.2  HCT 39.5  PLT 514*   ------------------------------------------------------------------------------------------------------------------ Chemistries  Recent Labs  Lab 05/19/18 0451  NA 140  K 3.0*  CL 107  CO2 26  GLUCOSE 206*  BUN 35*  CREATININE 1.13  CALCIUM 7.9*  AST 117*  ALT 113*  ALKPHOS 116  BILITOT 1.4*   ------------------------------------------------------------------------------------------------------------------  Cardiac Enzymes No results for input(s): TROPONINI in the last 168 hours. ------------------------------------------------------------------------------------------------------------------  RADIOLOGY:  US Abdomen Limited Ruq  Result Date: 05/19/2018 CLINICAL DATA:  Hyperbilirubinemia. History of cholecystostomy tube placement. EXAM: ULTRASOUND ABDOMEN LIMITED RIGHT UPPER QUADRANT COMPARISON:  CT guided cholecystostomy tube placement-05/16/2018; CT abdomen pelvis-05/15/2018; ERCP-05/16/2018 FINDINGS: Examination is degraded due to patient body habitus and poor sonographic window also due to patient's overlying bandage associated with recently placed cholecystostomy tube Gallbladder: There is a large amount of echogenic shadowing debris within the gallbladder compatible with known cholelithiasis. Recently placed cholecystostomy tube is not well identified sonographically. There is a minimal amount of residual gallbladder wall thickening, measuring approximately 0.3 cm in  diameter as well as a minimal amount of associated pericholecystic fluid. Common bile duct: Diameter: Normal in size measuring 2.3 mm in diameter Liver: Note is made of an approximately 1.2 x 1.1  x 1.0 cm anechoic cyst within the subcapsular aspect of the left lobe of the liver as demonstrated on recent abdominal CT. No discrete worrisome hepatic lesions. No intrahepatic biliary ductal dilatation. Portal vein is patent on color Doppler imaging with normal direction of blood flow towards the liver. No ascites. IMPRESSION: 1. No evidence of intra or extrahepatic biliary duct dilatation to explain the patient's persistent hyperbilirubinemia. Further evaluation could be performed with MRCP as indicated. 2. Cholelithiasis with persistent gallbladder wall thickening and minimal amount residual pericholecystic fluid. Known cholecystostomy tube is not well identified sonographically. Electronically Signed   By: Sandi Mariscal M.D.   On: 05/19/2018 09:16     ASSESSMENT AND PLAN:   Sepsis secondary to acute cholecystitis-sepsis resolving.  Leukocytosis slightly worse today.  AST ALT and bilirubin have all trended down. -s/p ERCP with sphincterotomy on 4/10- no stones found -S/p cholecystostomy tube placement 4/10 -Continue IV Unasyn -GI and surgery following -Right upper quadrant ultrasound without any ductal dilatation -Will defer decision for additional imaging to GI  AKI- resolved with fluids -Restart lisinopril today at half home dose -Continue IV fluids  Hypokalemia -Replete and recheck  Hypertension-blood pressures mildly elevated today -Restart lisinopril at half home dose  Type II diabetes mellitus-blood sugars improving -A1c 7.2 this admission -Continue resistant SSI  DVT prophylaxis with Lovenox  All the records are reviewed and case discussed with Care Management/Social Worker. Management plans discussed with the patient, family and they are in agreement.  CODE STATUS: FULL  CODE  DVT Prophylaxis: SCDs  TOTAL TIME TAKING CARE OF THIS PATIENT: 40 minutes.   POSSIBLE D/C IN 1-2 DAYS, DEPENDING ON CLINICAL CONDITION.  Berna Spare Mayo M.D on 05/19/2018 at 3:08 PM  Between 7am to 6pm - Pager - [phone]  After 6pm go to [email or web address] - password [email or web address] [facility]  Office  [phone]  CC: Primary care physician; [name], MD  Note: This dictation was prepared with Dragon dictation along with smaller phrase technology. Any transcriptional errors that result from this process are unintentional.

## 2018-05-19 NOTE — Care Management Important Message (Signed)
Important Message  Patient Details  Name: Brent [name]. MRN: [id number] Date of Birth: 1945-08-31   Medicare Important Message Given:  Yes    Dannette Barbara 05/19/2018, 11:07 AM

## 2018-05-19 NOTE — Progress Notes (Addendum)
The patient's ultrasound did not show any sign of choledocholithiasis and his liver enzymes are coming down.  I would continue to monitor the patient without any further imaging at this time.  If the patient's liver enzyme should go back up then an MRCP or a cholangiogram through the drain would be needed at that time.

## 2018-05-20 LAB — COMPREHENSIVE METABOLIC PANEL
ALT: 111 U/L — ABNORMAL HIGH (ref 0–44)
AST: 125 U/L — ABNORMAL HIGH (ref 15–41)
Albumin: 2.4 g/dL — ABNORMAL LOW (ref 3.5–5.0)
Alkaline Phosphatase: 97 U/L (ref 38–126)
Anion gap: 8 (ref 5–15)
BUN: 22 mg/dL (ref 8–23)
CO2: 25 mmol/L (ref 22–32)
Calcium: 8.1 mg/dL — ABNORMAL LOW (ref 8.9–10.3)
Chloride: 106 mmol/L (ref 98–111)
Creatinine, Ser: 0.89 mg/dL (ref 0.61–1.24)
GFR calc Af Amer: >60 mL/min (ref >60)
GFR calc non Af Amer: >60 mL/min (ref >60)
Glucose, Bld: 163 mg/dL — ABNORMAL HIGH (ref 70–99)
Potassium: 3.4 mmol/L — ABNORMAL LOW (ref 3.5–5.1)
Sodium: 139 mmol/L (ref 135–145)
Total Bilirubin: 1.3 mg/dL — ABNORMAL HIGH (ref 0.3–1.2)
Total Protein: 6.1 g/dL — ABNORMAL LOW (ref 6.5–8.1)

## 2018-05-20 LAB — CBC
HCT: 35.9 % — ABNORMAL LOW (ref 39.0–52.0)
Hemoglobin: 12.2 g/dL — ABNORMAL LOW (ref 13.0–17.0)
MCH: 30.3 pg (ref 26.0–34.0)
MCHC: 34 g/dL (ref 30.0–36.0)
MCV: 89.3 fL (ref 80.0–100.0)
Platelets: 501 10*3/uL — ABNORMAL HIGH (ref 150–400)
RBC: 4.02 MIL/uL — ABNORMAL LOW (ref 4.22–5.81)
RDW: 13.3 % (ref 11.5–15.5)
WBC: 18.5 10*3/uL — ABNORMAL HIGH (ref 4.0–10.5)
nRBC: 0 % (ref 0.0–0.2)

## 2018-05-20 LAB — GLUCOSE, CAPILLARY
Glucose-Capillary: 143 mg/dL — ABNORMAL HIGH (ref 70–99)
Glucose-Capillary: 159 mg/dL — ABNORMAL HIGH (ref 70–99)

## 2018-05-20 LAB — CULTURE, BLOOD (ROUTINE X 2)
Culture: NO GROWTH
Culture: NO GROWTH
Special Requests: ADEQUATE
Special Requests: ADEQUATE

## 2018-05-20 LAB — MAGNESIUM: Magnesium: 2 mg/dL (ref 1.7–2.4)

## 2018-05-20 MED ORDER — SODIUM CHLORIDE 0.9% FLUSH: 5.0000 mL | Freq: Three times a day (TID) | INTRAVENOUS | 0 refills | Status: DC

## 2018-05-20 MED ORDER — AMOXICILLIN-POT CLAVULANATE 875-125 MG PO TABS: 1.0000 | ORAL_TABLET | Freq: Two times a day (BID) | ORAL | 0 refills | Status: AC

## 2018-05-20 NOTE — Progress Notes (Addendum)
Discharge order received. Patient is alert and oriented. Vital signs stable . No signs of acute distress. Discharge instructions given. Patient able to return a demonstration on how to flush biliary tube. Patient verbalized understanding. No other issues noted at this time.

## 2018-05-20 NOTE — Discharge Summary (Signed)
[facility] [place] NAME: Brent [name]    MR#:  [id number]  DATE OF BIRTH:  [birth year]  DATE OF ADMISSION:  05/15/2018   ADMITTING PHYSICIAN: Hillary Bow, MD  DATE OF DISCHARGE: 05/20/18  PRIMARY CARE PHYSICIAN: [name], MD   ADMISSION DIAGNOSIS:  Metabolic acidosis [O67.1] Choledocholithiasis with cholecystitis [K80.40] Leukocytosis, unspecified type [D72.829] DISCHARGE DIAGNOSIS:  Active Problems:   Choledocholithiasis   Elevated liver enzymes   Abnormal CT of the abdomen   Duodenal ulceration  SECONDARY DIAGNOSIS:   Past Medical History:  Diagnosis Date  . Diabetes mellitus without complication ([place])   . Hyperlipidemia   . Hypertension    HOSPITAL COURSE:   Brent [name] is a 73 year old male who presented to the ED with right upper abdominal pain and vomiting.  In the ED, WBC 44,000.  CT abdomen showed acute cholecystitis with choledocholithiasis.  Patient was admitted for further management.  Acute cholecystitis- initially meeting sepsis criteria, but sepsis resolved.  -AST, ALT, bilirubin, leukocytosis have all improved -s/p ERCP with sphincterotomy on 4/10- no stones found -S/p cholecystostomy tube placement 4/10 -AST, ALT, bilirubin all increased the day after cholecystostomy tube placement.  Right upper quadrant ultrasound was ordered and did not show any ductal dilatation. -Treated with IV Unasyn and then transitioned to Augmentin on discharge -Needs to follow-up with GI in 2 weeks and with surgery in 4 weeks -Plan for interval cholecystectomy in 6 to 8 weeks -Home health RN ordered on discharge  AKI- resolved with fluids -Lisinopril and HCTZ initially held, but were restarted on discharge  Hypertension-blood pressures mildly elevated today -Home blood pressure medicines were continued on discharge  Type II diabetes mellitus-blood sugars improving -A1c 7.2 this admission -Home diabetes medicines were  restarted on discharge  DISCHARGE CONDITIONS:  Acute cholecystitis Hypertension Type 2 diabetes CONSULTS OBTAINED:  Treatment Team:  [name], MD [name], MD DRUG ALLERGIES:  No Known Allergies DISCHARGE MEDICATIONS:   Allergies as of 05/20/2018   No Known Allergies     Medication List    TAKE these medications   amoxicillin-clavulanate 875-125 MG tablet Commonly known as:  Augmentin Take 1 tablet by mouth 2 (two) times daily for 14 days.   aspirin EC 81 MG tablet Take 81 mg by mouth daily.   atorvastatin 40 MG tablet Commonly known as:  LIPITOR Take 40 mg by mouth daily.   hydrochlorothiazide 12.5 MG tablet Commonly known as:  HYDRODIURIL Take 1 tablet by mouth daily.   ketoconazole 2 % cream Commonly known as:  NIZORAL Apply 1 application topically at bedtime.   lisinopril 40 MG tablet Commonly known as:  PRINIVIL,ZESTRIL Take 1 tablet by mouth daily.   loratadine 10 MG tablet Commonly known as:  CLARITIN Take 10 mg by mouth daily as needed for allergies.   metFORMIN 500 MG tablet Commonly known as:  GLUCOPHAGE Take 500 mg by mouth daily with breakfast.   sodium chloride flush 0.9 % Soln Commonly known as:  NS 5 mLs by Intracatheter route every 8 (eight) hours.   tadalafil 10 MG tablet Commonly known as:  CIALIS Take 10 mg by mouth as directed.   triamcinolone cream 0.5 % Commonly known as:  KENALOG Apply 1 application topically 2 (two) times daily.        DISCHARGE INSTRUCTIONS:  1.  Follow-up with PCP in 5 days 2.  Follow-up with GI in 2 weeks 3.  Follow-up with surgery in 4 weeks 4.  Take Augmentin twice daily 5.  Flush cholecystostomy tube 3 times a day with 5 mL normal saline DIET:  Cardiac diet and Diabetic diet DISCHARGE CONDITION:  Stable ACTIVITY:  Activity as tolerated OXYGEN:  Home Oxygen: No.  Oxygen Delivery: room air DISCHARGE LOCATION:  home   If you experience worsening of your admission symptoms,  develop shortness of breath, life threatening emergency, suicidal or homicidal thoughts you must seek medical attention immediately by calling 911 or calling your MD immediately  if symptoms less severe.  You Must read complete instructions/literature along with all the possible adverse reactions/side effects for all the Medicines you take and that have been prescribed to you. Take any new Medicines after you have completely understood and accpet all the possible adverse reactions/side effects.   Please note  You were cared for by a hospitalist during your hospital stay. If you have any questions about your discharge medications or the care you received while you were in the hospital after you are discharged, you can call the unit and asked to speak with the hospitalist on call if the hospitalist that took care of you is not available. Once you are discharged, your primary care physician will handle any further medical issues. Please note that NO REFILLS for any discharge medications will be authorized once you are discharged, as it is imperative that you return to your primary care physician (or establish a relationship with a primary care physician if you do not have one) for your aftercare needs so that they can reassess your need for medications and monitor your lab values.    On the day of Discharge:  VITAL SIGNS:  Blood pressure (!) 161/77, pulse (!) 57, temperature 98.6 F (37 C), temperature source Oral, resp. rate 18, height 5\' 8"  (1.727 m), weight 104.3 kg, SpO2 94 %. PHYSICAL EXAMINATION:  GENERAL:  73 y.o.-year-old patient lying in the bed, in no acute distress EYES: Pupils equal, round, reactive to light and accommodation. No scleral icterus. Extraocular muscles intact.  HEENT: Head atraumatic, normocephalic. Oropharynx and nasopharynx clear.  NECK:  Supple, no jugular venous distention. No thyroid enlargement, no tenderness.  LUNGS: Normal breath sounds bilaterally, no wheezing,  rales, rhonchi. No use of accessory muscles of respiration.  CARDIOVASCULAR: RRR, S1, S2 normal. No murmurs, rubs, or gallops.  ABDOMEN: Soft. Bowel sounds present. No organomegaly or mass. + Mild right upper quadrant tenderness to palpation, no rebound or guarding. +Cholecystostomy tube in place. + Abdominal distention. EXTREMITIES: No cyanosis, clubbing or edema b/l.    NEUROLOGIC: Cranial nerves II through XII are intact. No focal Motor or sensory deficits b/l.   PSYCHIATRIC: The patient is alert and oriented x 3.  SKIN: No obvious rash, lesion, or ulcer.  DATA REVIEW:   CBC Recent Labs  Lab 05/20/18 0321  WBC 18.5*  HGB 12.2*  HCT 35.9*  PLT 501*    Chemistries  Recent Labs  Lab 05/20/18 0321  NA 139  K 3.4*  CL 106  CO2 25  GLUCOSE 163*  BUN 22  CREATININE 0.89  CALCIUM 8.1*  MG 2.0  AST 125*  ALT 111*  ALKPHOS 97  BILITOT 1.3*     Microbiology Results  Results for orders placed or performed during the hospital encounter of 05/15/18  CULTURE, BLOOD (ROUTINE X 2) w Reflex to ID Panel     Status: None   Collection Time: 05/15/18 12:16 PM  Result Value Ref Range Status   Specimen Description BLOOD RIGHT ANTECUBITAL  Final   Special Requests   Final    BOTTLES DRAWN AEROBIC AND ANAEROBIC Blood Culture adequate volume   Culture   Final    NO GROWTH 5 DAYS Performed at [facility], [address] [place], [place] [address]    Report Status 05/20/2018 FINAL  Final  CULTURE, BLOOD (ROUTINE X 2) w Reflex to ID Panel     Status: None   Collection Time: 05/15/18 12:23 PM  Result Value Ref Range Status   Specimen Description BLOOD RT HAND  Final   Special Requests   Final    BOTTLES DRAWN AEROBIC AND ANAEROBIC Blood Culture adequate volume   Culture   Final    NO GROWTH 5 DAYS Performed at [facility], [address] [place], [place] [address]    Report Status 05/20/2018 FINAL  Final  Surgical PCR screen     Status: Abnormal    Collection Time: 05/15/18  1:34 PM  Result Value Ref Range Status   MRSA, PCR POSITIVE (A) NEGATIVE Final    Comment: CRITICAL RESULT CALLED TO, READ BACK BY AND VERIFIED WITH: CALLED TO MOLLY WEISMILLER @1500  05/15/2018 [facility]    Staphylococcus aureus POSITIVE (A) NEGATIVE Final    Comment: (NOTE) The Xpert SA Assay (FDA approved for NASAL specimens in patients 28 years of age and older), is one component of a comprehensive surveillance program. It is not intended to diagnose infection nor to guide or monitor treatment. Performed at [facility], [address], [place], [place] [address]   Aerobic/Anaerobic Culture (surgical/deep wound)     Status: None (Preliminary result)   Collection Time: 05/16/18  3:34 PM  Result Value Ref Range Status   Specimen Description   Final    GALL BLADDER Performed at [facility], [address] [place], [place] [address]    Special Requests   Final    NONE Performed at [facility], [place], [place] [address]    Gram Stain NO WBC SEEN NO ORGANISMS SEEN   Final   Culture   Final    NO GROWTH 4 DAYS NO ANAEROBES ISOLATED; CULTURE IN PROGRESS FOR 5 DAYS Performed at [facility] [address] [place], [place] [address]    Report Status PENDING  Incomplete  C difficile quick scan w PCR reflex     Status: None   Collection Time: 05/18/18  8:27 PM  Result Value Ref Range Status   C Diff antigen NEGATIVE NEGATIVE Final   C Diff toxin NEGATIVE NEGATIVE Final   C Diff interpretation No C. difficile detected.  Final    Comment: Performed at [facility], [address] [place], [place] [address]  Gastrointestinal Panel by PCR , Stool     Status: None   Collection Time: 05/18/18  8:27 PM  Result Value Ref Range Status   Campylobacter species NOT DETECTED NOT DETECTED Final   Plesimonas shigelloides NOT DETECTED NOT DETECTED Final   Salmonella species NOT DETECTED NOT DETECTED Final    Yersinia enterocolitica NOT DETECTED NOT DETECTED Final   Vibrio species NOT DETECTED NOT DETECTED Final   Vibrio cholerae NOT DETECTED NOT DETECTED Final   Enteroaggregative E coli (EAEC) NOT DETECTED NOT DETECTED Final   Enteropathogenic E coli (EPEC) NOT DETECTED NOT DETECTED Final   Enterotoxigenic E coli (ETEC) NOT DETECTED NOT DETECTED Final   Shiga like toxin producing E coli (STEC) NOT DETECTED NOT DETECTED Final   Shigella/Enteroinvasive  E coli (EIEC) NOT DETECTED NOT DETECTED Final   Cryptosporidium NOT DETECTED NOT DETECTED Final   Cyclospora cayetanensis NOT DETECTED NOT DETECTED Final   Entamoeba histolytica NOT DETECTED NOT DETECTED Final   Giardia lamblia NOT DETECTED NOT DETECTED Final   Adenovirus F40/41 NOT DETECTED NOT DETECTED Final   Astrovirus NOT DETECTED NOT DETECTED Final   Norovirus GI/GII NOT DETECTED NOT DETECTED Final   Rotavirus A NOT DETECTED NOT DETECTED Final   Sapovirus (I, II, IV, and V) NOT DETECTED NOT DETECTED Final    Comment: Performed at [facility], [address] [place], [place] [address]    RADIOLOGY:  No results found.   Management plans discussed with the patient, family and they are in agreement.  CODE STATUS: Full Code   TOTAL TIME TAKING CARE OF THIS PATIENT: 45 minutes.    Brent [name] M.D on 05/20/2018 at 11:56 AM  Between 7am to 6pm - Pager - [phone]  After 6pm go to [email or web address] - Proofreader  [facility]  Office  [phone]  CC: Primary care physician; [name], MD   Note: This dictation was prepared with Dragon dictation along with smaller phrase technology. Any transcriptional errors that result from this process are unintentional.

## 2018-05-20 NOTE — TOC Transition Note (Signed)
Transition of Care [facility]) - CM/SW Discharge Note   Patient Details  Name: Brent [name]. MRN: [id number] Date of Birth: March 03, 1945  [facility] of [facility] CM/SW Contact:  Beverly Sessions, RN Phone Number: 05/20/2018, 10:40 AM   Clinical Narrative:     Patient acute cholecystitis status post percutaneous cholecystostomy drain in place.  Patient to discharge home today.  Patient states that he lives at home with his grandson.  Yolanda Bonine is "in and out" but will be able to health.  Patient states that at baseline he is independent.  "I can still drive, but I have people that can drive me if I need them too"  PCP [place].  Pharmacy [facility]  Patient denies issues obtaining medications  Orders for home health RN.  Patient agreeable to services.  CMS Medicare.gov Compare Post Acute Care list reviewed with patient.  Patient states that he does not have a preference of agency.  Referral made to [facility] with [place].   Final next level of care: Home w Home Health Services Barriers to Discharge: Barriers Resolved   Patient Goals and CMS Choice   CMS Medicare.gov Compare Post Acute Care list provided to:: Patient Choice offered to / list presented to : Patient  Discharge Placement                       Discharge Plan and Services   Discharge Planning Services: CM Consult Post Acute Care Choice: Home Health              HH Arranged: RN [facility] Agency: [place] ([facility])   Social Determinants of Health (SDOH) Interventions     Readmission Risk Interventions No flowsheet data found.

## 2018-05-20 NOTE — Progress Notes (Signed)
[place] Hospital Day(s): 5.   Post op day(s): 4 Days Post-Op.   Interval History: Patient seen and examined, no acute events or new complaints overnight. Patient reports no abdominal pain, denies nausea or vomiting.  Reports bowels are loose but no increased frequency.  Denies fever chills  Vital signs in last 24 hours: [min-max] current  Temp:  [98.6 F (37 C)-98.9 F (37.2 C)] 98.6 F (37 C) (04/14 0542) Pulse Rate:  [57-84] 57 (04/14 0542) Resp:  [18-20] 18 (04/14 0542) BP: (161-165)/(77-87) 161/77 (04/14 0542) SpO2:  [94 %-97 %] 94 % (04/14 0542)     Height: 5\' 8"  (172.7 cm) Weight: 104.3 kg BMI (Calculated): 34.98   Cholecystostomy drain: 40 mL  Physical Exam:  Constitutional: alert, cooperative and no distress  Respiratory: breathing non-labored at rest  Cardiovascular: regular rate and sinus rhythm  Gastrointestinal: soft, non-tender, and non-distended.  Right costal side cholecystostomy drain in place with dark green bile  Labs:  CBC Latest Ref Rng & Units 05/20/2018 05/19/2018 05/18/2018  WBC 4.0 - 10.5 K/uL 18.5(H) 19.4(H) 17.5(H)  Hemoglobin 13.0 - 17.0 g/dL 12.2(L) 13.2 13.1  Hematocrit 39.0 - 52.0 % 35.9(L) 39.5 39.4  Platelets 150 - 400 K/uL 501(H) 514(H) 478(H)   CMP Latest Ref Rng & Units 05/20/2018 05/19/2018 05/18/2018  Glucose 70 - 99 mg/dL 163(H) 206(H) -  BUN 8 - 23 mg/dL 22 35(H) -  Creatinine 0.61 - 1.24 mg/dL 0.89 1.13 -  Sodium 135 - 145 mmol/L 139 140 -  Potassium 3.5 - 5.1 mmol/L 3.4(L) 3.0(L) -  Chloride 98 - 111 mmol/L 106 107 -  CO2 22 - 32 mmol/L 25 26 -  Calcium 8.9 - 10.3 mg/dL 8.1(L) 7.9(L) -  Total Protein 6.5 - 8.1 g/dL 6.1(L) 6.5 -  Total Bilirubin 0.3 - 1.2 mg/dL 1.3(H) 1.4(H) 2.9(H)  Alkaline Phos 38 - 126 U/L 97 116 -  AST 15 - 41 U/L 125(H) 117(H) -  ALT 0 - 44 U/L 111(H) 113(H) -    Imaging studies: No new pertinent imaging studies   Assessment/Plan:  Patient with acute cholecystitis status post percutaneous  cholecystostomy drain in place. Patient continue without worsening of abdominal pain.  Actually he reports no abdominal pain today.  He tolerated full liquid diet.  I advanced his diet to a soft diet.  This may help with the loose stool too.  From cholecystitis standpoint this is under control.  No fever, no tachycardia, white blood cell count at baseline.  Normalized renal function. Bilirubin continue to decrease slowly.  Since patient had a ERCP with stent placement, I will defer recommendation of further imaging and work-up to hospitalist/GI. Will continue to follow while the patient remains in-house.  Arnold Long, MD

## 2018-05-20 NOTE — Discharge Instructions (Signed)
It was so nice to meet you during this hospitalization!  You came into the hospital with abdominal pain. We found that you had a pretty bad infection of your gallbladder. We placed a drain to help get rid of the infection.   I have prescribed the following medications: 1. Augmentin- this is your antibiotic- please take 1 tablet twice a day 2. Normal saline flushes- this is the liquid to flush the drain with- please flush the drain with 11ml of normal saline three times a day.  Please make sure you follow-up with the GI doctor in 2 weeks and the general surgeon in 4 weeks. You will likely have your gallbladder removed in 6-8 weeks after this infection calms down.  Take care, Dr. Brett Albino

## 2018-05-21 LAB — AEROBIC/ANAEROBIC CULTURE W GRAM STAIN (SURGICAL/DEEP WOUND)
Culture: NO GROWTH
Gram Stain: NONE SEEN

## 2018-06-02 NOTE — Progress Notes (Signed)
Brent Lame, MD [address]  [place], [place] [address]  Main: [phone]  Fax: [phone]    Gastroenterology Virtual/Video Visit  Referring Provider:     [name], MD Primary Care Physician:  [name], MD Primary Gastroenterologist:  Dr.Lajuane Leatham Allen Norris Reason for Consultation:     Follow-up after hospital discharge        HPI:    Virtual Visit via Video Note Location of the patient: Car-parking lot of mid been [facility] Location of provider: Office  Participating persons: The patient myself and Ginger Feldpausch and the patient's daughter.  I connected with Wynona Canes. on 06/02/18 at  8:00 AM EDT by a video enabled telemedicine application and verified that I am speaking with the correct person using two identifiers.   I discussed the limitations of evaluation and management by telemedicine and the availability of in person appointments. The patient expressed understanding and agreed to proceed.  Verbal consent to proceed obtained.  History of Present Illness: Brent [name]. is a 73 y.o. male referred by Dr. [name], MD  for follow-up after being discharged to the hospital.  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  The patient was treated with a sphincterotomy and his liver enzymes eventually came down.  The patient also had a cholecystostomy tube placed at that time.  The patient is concerned because his white cell count was elevated at his last PCP visit.  The white cell count was over 19.  The patient's liver enzymes are normal as well as the alkaline phosphatase.  He denies any jaundice dark urine light stools or abdominal pain.  Past Medical History:  Diagnosis Date   Diabetes mellitus without complication ([place])    Hyperlipidemia    Hypertension     Past Surgical History:  Procedure Laterality Date   CARPAL TUNNEL RELEASE Left 10/2015    COLONOSCOPY     COLONOSCOPY WITH PROPOFOL N/A 08/05/2017   Procedure: COLONOSCOPY WITH PROPOFOL;  Surgeon: Manya Silvas, MD;  Location: [facility] ENDOSCOPY;  Service: Endoscopy;  Laterality: N/A;   ENDOSCOPIC RETROGRADE CHOLANGIOPANCREATOGRAPHY (ERCP) WITH PROPOFOL N/A 05/16/2018   Procedure: ENDOSCOPIC RETROGRADE CHOLANGIOPANCREATOGRAPHY (ERCP) WITH PROPOFOL;  Surgeon: Brent Lame, MD;  Location: [facility] ENDOSCOPY;  Service: Endoscopy;  Laterality: N/A;   SPLENECTOMY      Prior to Admission medications   Medication Sig Start Date End Date Taking? Authorizing Provider  amoxicillin-clavulanate (AUGMENTIN) 875-125 MG tablet Take 1 tablet by mouth 2 (two) times daily for 14 days. 05/20/18 06/03/18  Mayo, Pete Pelt, MD  aspirin EC 81 MG tablet Take 81 mg by mouth daily.     [provider]  atorvastatin (LIPITOR) 40 MG tablet Take 40 mg by mouth daily.    [provider]  hydrochlorothiazide (HYDRODIURIL) 12.5 MG tablet Take 1 tablet by mouth daily. 06/30/15   [provider]  ketoconazole (NIZORAL) 2 % cream Apply 1 application topically at bedtime. 01/27/18   [provider]  lisinopril (PRINIVIL,ZESTRIL) 40 MG tablet Take 1 tablet by mouth daily. 05/01/15   [provider]  loratadine (CLARITIN) 10 MG tablet Take 10 mg by mouth daily as needed for allergies.    [provider]  metFORMIN (GLUCOPHAGE) 500 MG tablet Take 500 mg by mouth daily with breakfast. 02/28/18   [provider]  sodium chloride flush (NS) 0.9 % SOLN 5 mLs by Intracatheter route every 8 (  eight) hours. 05/20/18   Mayo, Pete Pelt, MD  tadalafil (CIALIS) 10 MG tablet Take 10 mg by mouth as directed. 04/10/18   [provider]  triamcinolone cream (KENALOG) 0.5 % Apply 1 application topically 2 (two) times daily.    [provider]    No family history on file.   Social History   Tobacco Use   Smoking status: Former Smoker   Smokeless tobacco: Never  Used  Substance Use Topics   Alcohol use: Not Currently   Drug use: Never    Allergies as of 06/03/2018   (No Known Allergies)    Review of Systems:    All systems reviewed and negative except where noted in HPI.   Observations/Objective:  Labs: CBC    Component Value Date/Time   WBC 18.5 (H) 05/20/2018 0321   RBC 4.02 (L) 05/20/2018 0321   HGB 12.2 (L) 05/20/2018 0321   HCT 35.9 (L) 05/20/2018 0321   PLT 501 (H) 05/20/2018 0321   MCV 89.3 05/20/2018 0321   MCH 30.3 05/20/2018 0321   MCHC 34.0 05/20/2018 0321   RDW 13.3 05/20/2018 0321   LYMPHSABS 0.6 (L) 05/17/2018 0415   MONOABS 1.2 (H) 05/17/2018 0415   EOSABS 0.0 05/17/2018 0415   BASOSABS 0.0 05/17/2018 0415   CMP     Component Value Date/Time   NA 139 05/20/2018 0321   K 3.4 (L) 05/20/2018 0321   CL 106 05/20/2018 0321   CO2 25 05/20/2018 0321   GLUCOSE 163 (H) 05/20/2018 0321   BUN 22 05/20/2018 0321   CREATININE 0.89 05/20/2018 0321   CALCIUM 8.1 (L) 05/20/2018 0321   PROT 6.1 (L) 05/20/2018 0321   ALBUMIN 2.4 (L) 05/20/2018 0321   AST 125 (H) 05/20/2018 0321   ALT 111 (H) 05/20/2018 0321   ALKPHOS 97 05/20/2018 0321   BILITOT 1.3 (H) 05/20/2018 0321   GFRNONAA >60 05/20/2018 0321   GFRAA >60 05/20/2018 0321    Imaging Studies: Dg Chest 1 View  Result Date: 05/16/2018 CLINICAL DATA:  Hypoxia EXAM: CHEST  1 VIEW COMPARISON:  CT abdomen 05/15/2018 FINDINGS: Bilateral lower lobe hazy airspace disease likely reflecting atelectasis. No right pleural effusion. Possible trace left pleural effusion. No pneumothorax. Normal heart size. Right perihilar opacity which may reflect adenopathy. No aggressive osseous lesion. IMPRESSION: 1. Bibasilar airspace disease likely reflecting atelectasis. 2. Right perihilar opacity which may reflect adenopathy. If there is further clinical concern, recommend a CT of the chest. Electronically Signed   By: Kathreen Devoid   On: 05/16/2018 12:56   Ct Abdomen Pelvis W  Contrast  Result Date: 05/15/2018 CLINICAL DATA:  73 year old male with abdominal pain EXAM: CT ABDOMEN AND PELVIS WITH CONTRAST TECHNIQUE: Multidetector CT imaging of the abdomen and pelvis was performed using the standard protocol following bolus administration of intravenous contrast. CONTRAST:  179mL OMNIPAQUE IOHEXOL 300 MG/ML  SOLN COMPARISON:  None. FINDINGS: Lower chest: Atelectatic changes at the lung bases with no acute finding. Hepatobiliary: Unremarkable liver. Gallbladder is distended with layered mixed density material at the gallbladder neck. Hyperdense material within the cystic duct, and at the distal common bile duct above the ampulla. No intrahepatic biliary ductal dilatation. Inflammatory changes within the fat adjacent to the gallbladder with trace fluid at the fascial planes. Pancreas: Unremarkable pancreas Spleen: Small volume residual splenic tissue, potentially from prior resection/splenectomy with possible splenosis/splenule is a remaining. Adrenals/Urinary Tract: Unremarkable adrenal glands. Bilateral kidneys unremarkable without hydronephrosis or nephrolithiasis. Unremarkable course of the ureters. Urinary bladder  relatively decompressed. Stomach/Bowel: Unremarkable stomach. Small hiatal hernia. Inflammatory changes adjacent to the duodenum are likely secondary to the inflamed gallbladder. Mild dilation of small bowel with air-fluid levels and no evidence of transition. Normal appendix. Mild stool burden. Diverticular change without evidence of acute diverticulitis. Vascular/Lymphatic: Atherosclerotic changes of the abdominal aorta. Mesenteric and renal arteries are patent. Bilateral iliac arteries and bilateral proximal femoral arteries are patent. Unremarkable venous system. Reproductive: Unremarkable appearance of the pelvic structures. Other: .  Trace free fluid within the pelvis. Musculoskeletal: No displaced fracture. 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 Advanced disc disease at L5-S1. More subtle disc disease throughout the remainder of the lumbar levels. Degenerative changes of the hips. IMPRESSION: CT findings of acute cholecystitis, with retained choledocholithiasis of the common bile duct. Referral for surgical evaluation is indicated. These results were discussed by telephone at the time of interpretation on 05/15/2018 at 9:28 am with Dr. Carrie Mew. Aortic Atherosclerosis (ICD10-I70.0). Additional ancillary findings as above. Electronically Signed   By: Corrie Mckusick D.O.   On: 05/15/2018 09:30   Dg C-arm 1-60 Min-no Report  Result Date: 05/16/2018 Fluoroscopy was utilized by the requesting physician.  No radiographic interpretation.   Ct Image Guided Drainage By Percutaneous Catheter  Result Date: 05/16/2018 INDICATION: Acute cholecystitis EXAM: CT-GUIDED CHOLECYSTOSTOMY TUBE PLACEMENT MEDICATIONS: None ANESTHESIA/SEDATION: Moderate (conscious) sedation was employed during this procedure. A total of Versed 2 mg and Fentanyl 100 mcg was administered intravenously. Moderate Sedation Time: 10 minutes. The patient's level of consciousness and vital signs were monitored continuously by radiology nursing throughout the procedure under my direct supervision. FLUOROSCOPY TIME:  None COMPLICATIONS: None immediate. PROCEDURE: Informed written consent was obtained from the patient after a thorough discussion of the procedural risks, benefits and alternatives. All questions were addressed. Maximal Sterile Barrier Technique was utilized including caps, mask, sterile gowns, sterile gloves, sterile drape, hand hygiene and skin antiseptic. A timeout was performed prior to the initiation of the procedure. The right upper quadrant was prepped and draped in a sterile fashion. 1% lidocaine was utilized for local anesthesia. Under CT guidance, an 18 gauge needle was advanced into the gallbladder lumen and removed over an Amplatz wire. Ten [place] dilator followed  by a 10 [place] drain were inserted. It was looped and string fixed then sewn to the skin. Bilious fluid was aspirated. FINDINGS: Imaging confirms placement of a 10 French drain coiled in the lumen of the gallbladder. IMPRESSION: Successful 10 French cholecystostomy tube placement. Electronically Signed   By: Marybelle Killings M.D.   On: 05/16/2018 15:57   US Abdomen Limited Ruq  Result Date: 05/19/2018 CLINICAL DATA:  Hyperbilirubinemia. History of cholecystostomy tube placement. EXAM: ULTRASOUND ABDOMEN LIMITED RIGHT UPPER QUADRANT COMPARISON:  CT guided cholecystostomy tube placement-05/16/2018; CT abdomen pelvis-05/15/2018; ERCP-05/16/2018 FINDINGS: Examination is degraded due to patient body habitus and poor sonographic window also due to patient's overlying bandage associated with recently placed cholecystostomy tube Gallbladder: There is a large amount of echogenic shadowing debris within the gallbladder compatible with known cholelithiasis. Recently placed cholecystostomy tube is not well identified sonographically. There is a minimal amount of residual gallbladder wall thickening, measuring approximately 0.3 cm in diameter as well as a minimal amount of associated pericholecystic fluid. Common bile duct: Diameter: Normal in size measuring 2.3 mm in diameter Liver: Note is made of an approximately 1.2 x 1.1 x 1.0 cm anechoic cyst within the subcapsular aspect of the left lobe of the liver as demonstrated on  recent abdominal CT. No discrete worrisome hepatic lesions. No intrahepatic biliary ductal dilatation. Portal vein is patent on color Doppler imaging with normal direction of blood flow towards the liver. No ascites. IMPRESSION: 1. No evidence of intra or extrahepatic biliary duct dilatation to explain the patient's persistent hyperbilirubinemia. Further evaluation could be performed with MRCP as indicated. 2. Cholelithiasis with persistent gallbladder wall thickening and minimal amount residual  pericholecystic fluid. Known cholecystostomy tube is not well identified sonographically. Electronically Signed   By: Sandi Mariscal M.D.   On: 05/19/2018 09:16    Assessment and Plan:   Chief Walkup. is a 73 y.o. 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  The stricture was treated with a sphincterotomy and the liver enzymes have come back to normal.  The patient continues to have an elevated white cell count and he has been referred back to his primary care provider for that.  The patient also has been encouraged to follow-up with surgery sooner than later to discuss removal of the drain versus a cholecystectomy.  The patient has been told that if any symptoms of biliary obstruction such as jaundice fevers chills abdominal pain or dark urine should occur then he should contact me as soon as possible.  The patient and his daughter have been explained the plan and agree with it  Follow Up Instructions:  I discussed the assessment and treatment plan with the patient. The patient was provided an opportunity to ask questions and all were answered. The patient agreed with the plan and demonstrated an understanding of the instructions.   The patient was advised to call back or seek an in-person evaluation if the symptoms worsen or if the condition fails to improve as anticipated.  I provided 15 minutes of non-face-to-face time during this encounter.   Brent Lame, MD  Speech recognition software was used to dictate the above note.

## 2018-06-03 ENCOUNTER — Other Ambulatory Visit: Payer: Self-pay

## 2018-06-03 ENCOUNTER — Ambulatory Visit (INDEPENDENT_AMBULATORY_CARE_PROVIDER_SITE_OTHER): Payer: Medicare Other | Admitting: Gastroenterology

## 2018-06-03 ENCOUNTER — Encounter: Payer: Self-pay | Admitting: Gastroenterology

## 2018-06-03 DIAGNOSIS — K831 Obstruction of bile duct: Secondary | ICD-10-CM

## 2018-06-11 ENCOUNTER — Other Ambulatory Visit: Payer: Self-pay | Admitting: Internal Medicine

## 2018-06-17 ENCOUNTER — Ambulatory Visit: Payer: Self-pay | Admitting: General Surgery

## 2018-06-17 NOTE — H&P (Signed)
PATIENT PROFILE: Brent [name]. is a 73 y.o. male who presents to the Clinic for evaluation of cholelithiasis.  PCP:  [name], MD  HISTORY OF PRESENT ILLNESS: Brent [name] had an episode of acute cholecystitis with elevated liver enzymes and bilirubin in the beginning of April.  He was admitted to the hospital for IV antibiotics, ERCP procedure and management of cholecystitis.  Initial CT scan of the abdomen was reported as having choledocholithiasis.  ERCP was done and did not shows any stone in the common bile duct but shows stricture of the ampulla.  Patient had percutaneous cholecystostomy done.  This controlled a cholecystitis in combination with the antibiotics for 05/16/2018.  Patient had normalization of the white blood cell count, the pain control and tolerating diet.  Patient was discharged home.  I evaluated her recent labs and she had a mild elevation of the white blood cell to 19,000 but decreased to normal to 14,000 which is his baseline.  The liver enzymes and bilirubin has remained normal.  Patient has history of splenectomy many years ago.  His white blood cells and platelet has been mildly elevated physiologically since then.  Patient denies any fever, any abdominal pain.  He reports he is tolerating diet.  He denies diarrhea.  There is no pain radiation.  There is no alleviating or aggravating factors.   PROBLEM LIST:         Problem List  Date Reviewed: 05/26/2018         Noted   Psychophysiological insomnia 05/06/2018   Other male erectile dysfunction 10/08/2017   Vaccine counseling: PCV-13 vaccine administered on 06/24/15; Pt declines Tetanus & PNA-23 vaccines (04/05/17) 03/05/2016   Medicare annual wellness visit, initial: 12/29/13 03/05/2016   History of leukocytosis (WBC 14.7 - 04/03/18) 03/05/2016   Medicare annual wellness visit, subsequent 04/10/18 03/05/2016   Essential hypertension 10/15/2014   Type 2 diabetes mellitus with diabetic neuropathy, without  long-term current use of insulin (A1c 6.7% - 04/03/18) 06/26/2013   Pure hypercholesterolemia (LDL 89 - 04/03/18) 06/26/2013   Non morbid obesity due to excess calories 06/26/2013      GENERAL REVIEW OF SYSTEMS:   General ROS: negative for - chills, fatigue, fever, weight gain or weight loss Allergy and Immunology ROS: negative for - hives  Hematological and Lymphatic ROS: negative for - bleeding problems or bruising, negative for palpable nodes Endocrine ROS: negative for - heat or cold intolerance, hair changes Respiratory ROS: negative for - cough, shortness of breath or wheezing Cardiovascular ROS: no chest pain or palpitations GI ROS: negative for nausea, vomiting, abdominal pain, diarrhea, constipation Musculoskeletal ROS: negative for - joint swelling or muscle pain Neurological ROS: negative for - confusion, syncope Dermatological ROS: negative for pruritus and rash Psychiatric: negative for anxiety, depression, difficulty sleeping and memory loss  MEDICATIONS: CurrentMedications        Current Outpatient Medications  Medication Sig Dispense Refill  . aspirin 81 MG EC tablet Take 81 mg by mouth once daily.    Marland Kitchen atorvastatin (LIPITOR) 40 MG tablet Take 1 tablet by mouth once daily 90 tablet 0  . blood glucose diagnostic (CONTOUR NEXT STRIPS) test strip Use as directed. Check CBG's twice weekly. Dx: E11.49 50 each 11  . hydroCHLOROthiazide (HYDRODIURIL) 12.5 MG tablet Take 1 tablet by mouth once daily 90 tablet 0  . lancets (MICROLET LANCET) as directed. Check CBG's once daily. Dx: E11.40 50 each 11  . lisinopriL (ZESTRIL) 40 MG tablet Take 1 tablet by mouth  once daily 90 tablet 0  . loratadine (CLARITIN) 10 mg tablet Take 10 mg by mouth once daily as needed       . metFORMIN (GLUCOPHAGE) 500 MG tablet Take 1 tablet (500 mg total) by mouth daily with breakfast 90 tablet 1  . tadalafiL (CIALIS) 10 MG tablet Take 1 tablet (10 mg total) by mouth once daily as needed for  Erectile Dysfunction for up to 30 days Take dose 30-45 min prior to anticipated sexual activity. 10 tablet 0   No current facility-administered medications for this visit.       ALLERGIES: Patient has no known allergies.  PAST MEDICAL HISTORY:     Past Medical History:  Diagnosis Date  . 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 Hyperlipidemia    LDL 103- 12/2012  . Internal hemorrhoids 03/22/2014  . Obesity   . Tubular adenoma of colon, unspecified 03/22/2014    PAST SURGICAL HISTORY:      Past Surgical History:  Procedure Laterality Date  . COLONOSCOPY  03/22/2014   [facility]. Dorthy Cooler [facility] . COLON[facility]COPY  08/05/2017   Tubular Adenoma: [facility] 08/2020  . ENDOSCOPIC CARPAL TUNNEL RELEASE Left 10/12/2015   Dr.Menz  . SPLENECTOMY       FAMILY HISTORY:      Family History  Problem Relation Age of Onset  . No Known Problems Mother   . Diabetes type II Father   . Kidney disease Father   . Heart disease Father   . Colon polyps Father   . No Known Problems Sister   . No Known Problems Sister      SOCIAL HISTORY: Social History          Socioeconomic History  . Marital status: Widowed    Spouse name: Not on file  . Number of children: Not on file  . Years of education: Not on file  . Highest education level: Not on file  Occupational History  . Not on file  Social Needs  . Financial resource strain: Not on file  . Food insecurity:    Worry: Not on file    Inability: Not on file  . Transportation needs:    Medical: Not on file    Non-medical: Not on file  Tobacco Use  . Smoking status: Former Smoker    Packs/day: 1.00    Years: 30.00    Pack years: 30.00    Types: Cigarettes, Cigars    Last attempt to quit: 02/05/1993    Years since quitting: 25.3  . Smokeless tobacco: Never Used  Substance and Sexual Activity  .  Alcohol use: No    Alcohol/week: 0.0 standard drinks  . Drug use: No  . Sexual activity: Defer  Other Topics Concern  . Not on file  Social History Narrative   Marital Status- Widowed   [place]  Em[place]- [place]    Vitals:   06/17/18 1021  BP: 125/71  Pulse: 69   Body mass index is 37.11 kg/m. Weight: (!) 101.2 kg (223 lb)   GENERAL: Alert, active, oriented x3  HEENT: Pupils equal reactive to light. Extraocular movements are intact. Sclera clear. Palpebral conjunctiva normal red color.Pharynx clear.  NECK: Supple with no palpable mass and no adenopathy.  LUNGS: Sound clear  with no rales rhonchi or wheezes.  HEART: Regular rhythm S1 and S2 without murmur.  ABDOMEN: Soft and depressible, nontender with no palpable mass, no hepatomegaly.  Well-healed scar large on the left upper quadrant.  Right costal percutaneous cholecystostomy drain in place.  EXTREMITIES: Well-developed well-nourished symmetrical with no dependent edema.  NEUROLOGICAL: Awake alert oriented, facial expression symmetrical, moving all extremities.  REVIEW OF DATA: I have reviewed the following data today:      Appointment on 06/05/2018  Component Date Value  . WBC (White Blood Cell Co* 06/05/2018 14.6*  . RBC (Red Blood Cell Coun* 06/05/2018 4.99   . Hemoglobin 06/05/2018 14.9   . Hematocrit 06/05/2018 45.2   . MCV (Mean Corpuscular Vo* 06/05/2018 90.6   . MCH (Mean Corpuscular He* 06/05/2018 29.9   . MCHC (Mean Corpuscular H* 06/05/2018 33.0   . Platelet Count 06/05/2018 725*  . RDW-CV (Red Cell Distrib* 06/05/2018 12.6   . MPV (Mean Platelet Volum* 06/05/2018 9.4   . Neutrophils 06/05/2018 9.34*  . Lymphocytes 06/05/2018 3.34   . Monocytes 06/05/2018 1.23   . Eosinophils 06/05/2018 0.42   . Basophils 06/05/2018 0.21*  . Neutrophil % 06/05/2018 64.3   . Lymphocyte %  06/05/2018 22.9   . Monocyte % 06/05/2018 8.4   . Eosinophil % 06/05/2018 2.9   . Basophil% 06/05/2018 1.4   . Immature Granulocyte % 06/05/2018 0.1   . Immature Granulocyte Cou* 06/05/2018 0.02   Office Visit on 05/26/2018  Component Date Value  . WBC (White Blood Cell Co* 05/26/2018 19.6*  . RBC (Red Blood Cell Coun* 05/26/2018 4.80   . Hemoglobin 05/26/2018 14.7   . Hematocrit 05/26/2018 43.4   . MCV (Mean Corpuscular Vo* 05/26/2018 90.4   . MCH (Mean Corpuscular He* 05/26/2018 30.6   . MCHC (Mean Corpuscular H* 05/26/2018 33.9   . Platelet Count 05/26/2018 716*  . RDW-CV (Red Cell Distrib* 05/26/2018 13.5   . MPV (Mean Platelet Volum* 05/26/2018 9.8   . Neutrophils 05/26/2018 13.93*  . Lymphocytes 05/26/2018 3.31   . Monocytes 05/26/2018 1.24   . Eosinophils 05/26/2018 0.73*  . Basophils 05/26/2018 0.20*  . Neutrophil % 05/26/2018 71.1*  . Lymphocyte % 05/26/2018 16.9   . Monocyte % 05/26/2018 6.3   . Eosinophil % 05/26/2018 3.7   . Basophil% 05/26/2018 1.0   . Immature Granulocyte % 05/26/2018 1.0*  . Immature Granulocyte Cou* 05/26/2018 0.19*  . Glucose 05/26/2018 172*  . Sodium 05/26/2018 140   . Potassium 05/26/2018 4.2   . Chloride 05/26/2018 101   . Carbon Dioxide (CO2) 05/26/2018 33.4*  . Urea Nitrogen (BUN) 05/26/2018 25   . Creatinine 05/26/2018 1.0   . Glomerular Filtration Ra* 05/26/2018 74   . Calcium 05/26/2018 9.2   . AST  05/26/2018 22   . ALT  05/26/2018 32   . Alk Phos (alkaline Phosp* 05/26/2018 97   . Albumin 05/26/2018 3.4*  . Bilirubin, Total 05/26/2018 0.9   . Protein, Total 05/26/2018 6.7   . A/G Ratio 05/26/2018 1.0   Appointment on 04/03/2018  Component Date Value  . WBC (White Blood Cell Co* 04/03/2018 14.7*  . RBC (Red Blood Cell Coun* 04/03/2018 5.20   . Hemoglobin 04/03/2018 16.2   . Hematocrit 04/03/2018 46.3   . MCV (Mean Corpuscular Vo* 04/03/2018 89.0   . MCH (Mean Corpuscular He* 04/03/2018 31.2   . MCHC (Mean Corpuscular H*  04/03/2018 35.0   . Platelet Count 04/03/2018 487*  . RDW-CV (Red Cell Distrib* 04/03/2018  13.3   . MPV (Mean Platelet Volum* 04/03/2018 10.3   . Neutrophils 04/03/2018 8.83*  . Lymphocytes 04/03/2018 3.46   . Monocytes 04/03/2018 1.32   . Eosinophils 04/03/2018 0.76*  . Basophils 04/03/2018 0.28*  . Neutrophil % 04/03/2018 60.1   . Lymphocyte % 04/03/2018 23.5   . Monocyte % 04/03/2018 9.0   . Eosinophil % 04/03/2018 5.2*  . Basophil% 04/03/2018 1.9   . Immature Granulocyte % 04/03/2018 0.3   . Immature Granulocyte Cou* 04/03/2018 0.05   . Glucose 04/03/2018 196*  . Sodium 04/03/2018 139   . Potassium 04/03/2018 4.9   . Chloride 04/03/2018 103   . Carbon Dioxide (CO2) 04/03/2018 28.1   . Urea Nitrogen (BUN) 04/03/2018 23   . Creatinine 04/03/2018 1.0   . Glomerular Filtration Ra* 04/03/2018 74   . Calcium 04/03/2018 10.1   . AST  04/03/2018 22   . ALT  04/03/2018 19   . Alk Phos (alkaline Phosp* 04/03/2018 60   . Albumin 04/03/2018 4.4   . Bilirubin, Total 04/03/2018 0.8   . Protein, Total 04/03/2018 7.5   . A/G Ratio 04/03/2018 1.4   . Hemoglobin A1C 04/03/2018 6.7*  . Average Blood Glucose (C* 04/03/2018 146   . Cholesterol, Total 04/03/2018 149   . Triglyceride 04/03/2018 145   . HDL (High Density Lipopr* 04/03/2018 30.7   . LDL Calculated 04/03/2018 89   . VLDL Cholesterol 04/03/2018 29   . Cholesterol/HDL Ratio 04/03/2018 4.9      ASSESSMENT: Brent [name] is a 73 y.o. 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    Patient with known history of acute cholecystitis and elevated liver enzymes status post ERCP and percutaneous cholecystostomy drainage.  The drainage was done on May 16, 2018.  Patient was oriented that he will need cholecystostomy after 6 weeks of recovering from the episode.  Patient was oriented about the diagnosis of cholelithiasis and his complication with  cholecystitis. Also oriented about what is the gallbladder, its anatomy and function and the implications of having stones. Patient was oriented that a low percentage of patient will continue to have similar pain symptoms even after the gallbladder is removed. Surgical technique (open vs laparoscopic) was discussed. It was also discussed the goals of the surgery (decrease the pain episodes and avoid the risk of cholecystitis) and the risk of surgery including: bleeding, infection, common bile duct injury, stone retention, injury to other organs such as bowel, liver, stomach, other complications such as hernia, bowel obstruction among others. Also discussed with patient about anesthesia and its complications such as: reaction to medications, pneumonia, heart complications, death, among others.  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  Patient with other stable medical conditions such as diabetes and hypertension.  Patient oriented about the risk of getting infected with COVID-19.  Patient also oriented that he will be tested for coverage before the surgery.  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 Laparoscopic cholecystectomy with intra operative cholangiogram (29191) 2. Internal medicine clearance 3. Hold aspirin 5 days before surgery 4. Will call you with surgery date and pre operative instruction for pre admission.  5. Contact us if has any question or concern.  Patient verbalized understanding, all questions were answered, and were agreeable with the plan outlined above.  [name], MD  Electronically signed by [name], MD

## 2018-06-17 NOTE — H&P (View-Only) (Signed)
PATIENT PROFILE: Brent [name]. is a 73 y.o. male who presents to the Clinic for evaluation of cholelithiasis.  PCP:  [name], MD  HISTORY OF PRESENT ILLNESS: Brent [name] had an episode of acute cholecystitis with elevated liver enzymes and bilirubin in the beginning of April.  He was admitted to the hospital for IV antibiotics, ERCP procedure and management of cholecystitis.  Initial CT scan of the abdomen was reported as having choledocholithiasis.  ERCP was done and did not shows any stone in the common bile duct but shows stricture of the ampulla.  Patient had percutaneous cholecystostomy done.  This controlled a cholecystitis in combination with the antibiotics for 05/16/2018.  Patient had normalization of the white blood cell count, the pain control and tolerating diet.  Patient was discharged home.  I evaluated her recent labs and she had a mild elevation of the white blood cell to 19,000 but decreased to normal to 14,000 which is his baseline.  The liver enzymes and bilirubin has remained normal.  Patient has history of splenectomy many years ago.  His white blood cells and platelet has been mildly elevated physiologically since then.  Patient denies any fever, any abdominal pain.  He reports he is tolerating diet.  He denies diarrhea.  There is no pain radiation.  There is no alleviating or aggravating factors.   PROBLEM LIST:         Problem List  Date Reviewed: 05/26/2018         Noted   Psychophysiological insomnia 05/06/2018   Other male erectile dysfunction 10/08/2017   Vaccine counseling: PCV-13 vaccine administered on 06/24/15; Pt declines Tetanus & PNA-23 vaccines (04/05/17) 03/05/2016   Medicare annual wellness visit, initial: 12/29/13 03/05/2016   History of leukocytosis (WBC 14.7 - 04/03/18) 03/05/2016   Medicare annual wellness visit, subsequent 04/10/18 03/05/2016   Essential hypertension 10/15/2014   Type 2 diabetes mellitus with diabetic neuropathy, without  long-term current use of insulin (A1c 6.7% - 04/03/18) 06/26/2013   Pure hypercholesterolemia (LDL 89 - 04/03/18) 06/26/2013   Non morbid obesity due to excess calories 06/26/2013      GENERAL REVIEW OF SYSTEMS:   General ROS: negative for - chills, fatigue, fever, weight gain or weight loss Allergy and Immunology ROS: negative for - hives  Hematological and Lymphatic ROS: negative for - bleeding problems or bruising, negative for palpable nodes Endocrine ROS: negative for - heat or cold intolerance, hair changes Respiratory ROS: negative for - cough, shortness of breath or wheezing Cardiovascular ROS: no chest pain or palpitations GI ROS: negative for nausea, vomiting, abdominal pain, diarrhea, constipation Musculoskeletal ROS: negative for - joint swelling or muscle pain Neurological ROS: negative for - confusion, syncope Dermatological ROS: negative for pruritus and rash Psychiatric: negative for anxiety, depression, difficulty sleeping and memory loss  MEDICATIONS: CurrentMedications        Current Outpatient Medications  Medication Sig Dispense Refill  . aspirin 81 MG EC tablet Take 81 mg by mouth once daily.    Marland Kitchen atorvastatin (LIPITOR) 40 MG tablet Take 1 tablet by mouth once daily 90 tablet 0  . blood glucose diagnostic (CONTOUR NEXT STRIPS) test strip Use as directed. Check CBG's twice weekly. Dx: E11.49 50 each 11  . hydroCHLOROthiazide (HYDRODIURIL) 12.5 MG tablet Take 1 tablet by mouth once daily 90 tablet 0  . lancets (MICROLET LANCET) as directed. Check CBG's once daily. Dx: E11.40 50 each 11  . lisinopriL (ZESTRIL) 40 MG tablet Take 1 tablet by mouth  once daily 90 tablet 0  . loratadine (CLARITIN) 10 mg tablet Take 10 mg by mouth once daily as needed       . metFORMIN (GLUCOPHAGE) 500 MG tablet Take 1 tablet (500 mg total) by mouth daily with breakfast 90 tablet 1  . tadalafiL (CIALIS) 10 MG tablet Take 1 tablet (10 mg total) by mouth once daily as needed for  Erectile Dysfunction for up to 30 days Take dose 30-45 min prior to anticipated sexual activity. 10 tablet 0   No current facility-administered medications for this visit.       ALLERGIES: Patient has no known allergies.  PAST MEDICAL HISTORY:     Past Medical History:  Diagnosis Date  . 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 Hyperlipidemia    LDL 103- 12/2012  . Internal hemorrhoids 03/22/2014  . Obesity   . Tubular adenoma of colon, unspecified 03/22/2014    PAST SURGICAL HISTORY:      Past Surgical History:  Procedure Laterality Date  . COLONOSCOPY  03/22/2014   Dr. Dorthy Cooler @ [facility] . COLONOSCOPY  08/05/2017   Tubular Adenoma: [facility] 08/2020  . ENDOSCOPIC CARPAL TUNNEL RELEASE Left 10/12/2015   Dr.Menz  . SPLENECTOMY       FAMILY HISTORY:      Family History  Problem Relation Age of Onset  . No Known Problems Mother   . Diabetes type II Father   . Kidney disease Father   . Heart disease Father   . Colon polyps Father   . No Known Problems Sister   . No Known Problems Sister      SOCIAL HISTORY: Social History          Socioeconomic History  . Marital status: Widowed    Spouse name: Not on file  . Number of children: Not on file  . Years of education: Not on file  . Highest education level: Not on file  Occupational History  . Not on file  Social Needs  . Financial resource strain: Not on file  . Food insecurity:    Worry: Not on file    Inability: Not on file  . Transportation needs:    Medical: Not on file    Non-medical: Not on file  Tobacco Use  . Smoking status: Former Smoker    Packs/day: 1.00    Years: 30.00    Pack years: 30.00    Types: Cigarettes, Cigars    Last attempt to quit: 02/05/1993    Years since quitting: 25.3  . Smokeless tobacco: Never Used  Substance and Sexual Activity  .  Alcohol use: No    Alcohol/week: 0.0 standard drinks  . Drug use: No  . Sexual activity: Defer  Other Topics Concern  . Not on file  Social History Narrative   Marital Status- Widowed   [place]  Employment- [place]    Vitals:   06/17/18 1021  BP: 125/71  Pulse: 69   Body mass index is 37.11 kg/m. Weight: (!) 101.2 kg (223 lb)   GENERAL: Alert, active, oriented x3  HEENT: Pupils equal reactive to light. Extraocular movements are intact. Sclera clear. Palpebral conjunctiva normal red color.Pharynx clear.  NECK: Supple with no palpable mass and no adenopathy.  LUNGS: Sound clear  with no rales rhonchi or wheezes.  HEART: Regular rhythm S1 and S2 without murmur.  ABDOMEN: Soft and depressible, nontender with no palpable mass, no hepatomegaly.  Well-healed scar large on the left upper quadrant.  Right costal percutaneous cholecystostomy drain in place.  EXTREMITIES: Well-developed well-nourished symmetrical with no dependent edema.  NEUROLOGICAL: Awake alert oriented, facial expression symmetrical, moving all extremities.  REVIEW OF DATA: I have reviewed the following data today:      Appointment on 06/05/2018  Component Date Value  . WBC (White Blood Cell Co* 06/05/2018 14.6*  . RBC (Red Blood Cell Coun* 06/05/2018 4.99   . Hemoglobin 06/05/2018 14.9   . Hematocrit 06/05/2018 45.2   . MCV (Mean Corpuscular Vo* 06/05/2018 90.6   . MCH (Mean Corpuscular He* 06/05/2018 29.9   . MCHC (Mean Corpuscular H* 06/05/2018 33.0   . Platelet Count 06/05/2018 725*  . RDW-CV (Red Cell Distrib* 06/05/2018 12.6   . MPV (Mean Platelet Volum* 06/05/2018 9.4   . Neutrophils 06/05/2018 9.34*  . Lymphocytes 06/05/2018 3.34   . Monocytes 06/05/2018 1.23   . Eosinophils 06/05/2018 0.42   . Basophils 06/05/2018 0.21*  . Neutrophil % 06/05/2018 64.3   . Lymphocyte %  06/05/2018 22.9   . Monocyte % 06/05/2018 8.4   . Eosinophil % 06/05/2018 2.9   . Basophil% 06/05/2018 1.4   . Immature Granulocyte % 06/05/2018 0.1   . Immature Granulocyte Cou* 06/05/2018 0.02   Office Visit on 05/26/2018  Component Date Value  . WBC (White Blood Cell Co* 05/26/2018 19.6*  . RBC (Red Blood Cell Coun* 05/26/2018 4.80   . Hemoglobin 05/26/2018 14.7   . Hematocrit 05/26/2018 43.4   . MCV (Mean Corpuscular Vo* 05/26/2018 90.4   . MCH (Mean Corpuscular He* 05/26/2018 30.6   . MCHC (Mean Corpuscular H* 05/26/2018 33.9   . Platelet Count 05/26/2018 716*  . RDW-CV (Red Cell Distrib* 05/26/2018 13.5   . MPV (Mean Platelet Volum* 05/26/2018 9.8   . Neutrophils 05/26/2018 13.93*  . Lymphocytes 05/26/2018 3.31   . Monocytes 05/26/2018 1.24   . Eosinophils 05/26/2018 0.73*  . Basophils 05/26/2018 0.20*  . Neutrophil % 05/26/2018 71.1*  . Lymphocyte % 05/26/2018 16.9   . Monocyte % 05/26/2018 6.3   . Eosinophil % 05/26/2018 3.7   . Basophil% 05/26/2018 1.0   . Immature Granulocyte % 05/26/2018 1.0*  . Immature Granulocyte Cou* 05/26/2018 0.19*  . Glucose 05/26/2018 172*  . Sodium 05/26/2018 140   . Potassium 05/26/2018 4.2   . Chloride 05/26/2018 101   . Carbon Dioxide (CO2) 05/26/2018 33.4*  . Urea Nitrogen (BUN) 05/26/2018 25   . Creatinine 05/26/2018 1.0   . Glomerular Filtration Ra* 05/26/2018 74   . Calcium 05/26/2018 9.2   . AST  05/26/2018 22   . ALT  05/26/2018 32   . Alk Phos (alkaline Phosp* 05/26/2018 97   . Albumin 05/26/2018 3.4*  . Bilirubin, Total 05/26/2018 0.9   . Protein, Total 05/26/2018 6.7   . A/G Ratio 05/26/2018 1.0   Appointment on 04/03/2018  Component Date Value  . WBC (White Blood Cell Co* 04/03/2018 14.7*  . RBC (Red Blood Cell Coun* 04/03/2018 5.20   . Hemoglobin 04/03/2018 16.2   . Hematocrit 04/03/2018 46.3   . MCV (Mean Corpuscular Vo* 04/03/2018 89.0   . MCH (Mean Corpuscular He* 04/03/2018 31.2   . MCHC (Mean Corpuscular H*  04/03/2018 35.0   . Platelet Count 04/03/2018 487*  . RDW-CV (Red Cell Distrib* 04/03/2018  13.3   . MPV (Mean Platelet Volum* 04/03/2018 10.3   . Neutrophils 04/03/2018 8.83*  . Lymphocytes 04/03/2018 3.46   . Monocytes 04/03/2018 1.32   . Eosinophils 04/03/2018 0.76*  . Basophils 04/03/2018 0.28*  . Neutrophil % 04/03/2018 60.1   . Lymphocyte % 04/03/2018 23.5   . Monocyte % 04/03/2018 9.0   . Eosinophil % 04/03/2018 5.2*  . Basophil% 04/03/2018 1.9   . Immature Granulocyte % 04/03/2018 0.3   . Immature Granulocyte Cou* 04/03/2018 0.05   . Glucose 04/03/2018 196*  . Sodium 04/03/2018 139   . Potassium 04/03/2018 4.9   . Chloride 04/03/2018 103   . Carbon Dioxide (CO2) 04/03/2018 28.1   . Urea Nitrogen (BUN) 04/03/2018 23   . Creatinine 04/03/2018 1.0   . Glomerular Filtration Ra* 04/03/2018 74   . Calcium 04/03/2018 10.1   . AST  04/03/2018 22   . ALT  04/03/2018 19   . Alk Phos (alkaline Phosp* 04/03/2018 60   . Albumin 04/03/2018 4.4   . Bilirubin, Total 04/03/2018 0.8   . Protein, Total 04/03/2018 7.5   . A/G Ratio 04/03/2018 1.4   . Hemoglobin A1C 04/03/2018 6.7*  . Average Blood Glucose (C* 04/03/2018 146   . Cholesterol, Total 04/03/2018 149   . Triglyceride 04/03/2018 145   . HDL (High Density Lipopr* 04/03/2018 30.7   . LDL Calculated 04/03/2018 89   . VLDL Cholesterol 04/03/2018 29   . Cholesterol/HDL Ratio 04/03/2018 4.9      ASSESSMENT: Brent [name] is a 73 y.o. 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    Patient with known history of acute cholecystitis and elevated liver enzymes status post ERCP and percutaneous cholecystostomy drainage.  The drainage was done on May 16, 2018.  Patient was oriented that he will need cholecystostomy after 6 weeks of recovering from the episode.  Patient was oriented about the diagnosis of cholelithiasis and his complication with  cholecystitis. Also oriented about what is the gallbladder, its anatomy and function and the implications of having stones. Patient was oriented that a low percentage of patient will continue to have similar pain symptoms even after the gallbladder is removed. Surgical technique (open vs laparoscopic) was discussed. It was also discussed the goals of the surgery (decrease the pain episodes and avoid the risk of cholecystitis) and the risk of surgery including: bleeding, infection, common bile duct injury, stone retention, injury to other organs such as bowel, liver, stomach, other complications such as hernia, bowel obstruction among others. Also discussed with patient about anesthesia and its complications such as: reaction to medications, pneumonia, heart complications, death, among others.  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  Patient with other stable medical conditions such as diabetes and hypertension.  Patient oriented about the risk of getting infected with COVID-19.  Patient also oriented that he will be tested for coverage before the surgery.  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 Laparoscopic cholecystectomy with intra operative cholangiogram (06301) 2. Internal medicine clearance 3. Hold aspirin 5 days before surgery 4. Will call you with surgery date and pre operative instruction for pre admission.  5. Contact us if has any question or concern.  Patient verbalized understanding, all questions were answered, and were agreeable with the plan outlined above.  [name], MD  Electronically signed by [name], MD

## 2018-07-02 ENCOUNTER — Other Ambulatory Visit: Payer: Self-pay

## 2018-07-02 ENCOUNTER — Encounter
Admission: RE | Admit: 2018-07-02 | Discharge: 2018-07-02 | Disposition: A | Discharge status: Home / Self Care | Payer: Medicare Other | Source: Ambulatory Visit | Attending: General Surgery | Admitting: General Surgery

## 2018-07-02 HISTORY — DX: Gastro-esophageal reflux disease without esophagitis: K21.9

## 2018-07-02 HISTORY — DX: Unspecified osteoarthritis, unspecified site: M19.90

## 2018-07-02 HISTORY — DX: Hereditary spherocytosis: D58.0

## 2018-07-02 NOTE — Patient Instructions (Addendum)
Your procedure is scheduled on: 07-07-18 MONDAY Report to Same Day Surgery 2nd floor medical mall [facility] Entrance-take elevator on left to 2nd floor.  Check in with surgery information desk.) To find out your arrival time please call [phone] between 1PM - 3PM on 07-04-18 FRIDAY  Remember: Instructions that are not followed completely may result in serious medical risk, up to and including death, or upon the discretion of your surgeon and anesthesiologist your surgery may need to be rescheduled.    _x___ 1. Do not eat food after midnight the night before your procedure. NO GUM OR CANDY AFTER MIDNIGHT. You may drink WATER up to 2 hours before you are scheduled to arrive at the hospital for your procedure.  Do not drink WATER within 2 hours of your scheduled arrival to the hospital.  Type 1 and type 2 diabetics should only drink water.   ____Ensure clear carbohydrate drink on the way to the hospital for bariatric patients  ____Ensure clear carbohydrate drink 3 hours before surgery for Dr Dwyane Luo patients if physician instructed.   No gum chewing or hard candies.     __x__ 2. No Alcohol for 24 hours before or after surgery.   __x__3. No Smoking or e-cigarettes for 24 prior to surgery.  Do not use any chewable tobacco products for at least 6 hour prior to surgery   ____  4. Bring all medications with you on the day of surgery if instructed.    __x__ 5. Notify your doctor if there is any change in your medical condition     (cold, fever, infections).    x___6. On the morning of surgery brush your teeth with toothpaste and water.  You may rinse your mouth with mouth wash if you wish.  Do not swallow any toothpaste or mouthwash.   Do not wear jewelry, make-up, hairpins, clips or nail polish.  Do not wear lotions, powders, or perfumes. You may wear deodorant.  Do not shave 48 hours prior to surgery. Men may shave face and neck.  Do not bring valuables to the hospital.    [facility] is not responsible for any belongings or valuables.               Contacts, dentures or bridgework may not be worn into surgery.  Leave your suitcase in the car. After surgery it may be brought to your room.  For patients admitted to the hospital, discharge time is determined by your treatment team.  _  Patients discharged the day of surgery will not be allowed to drive home.  You will need someone to drive you home and stay with you the night of your procedure.    Please read over the following fact sheets that you were given:   [facility] Preparing for Surgery  ____ Take anti-hypertensive listed below, cardiac, seizure, asthma, anti-reflux and psychiatric medicines. These include:  1. NONE  2.  3.  4.  5.  6.  ____Fleets enema or Magnesium Citrate as directed.   _x___ Use CHG Soap or sage wipes as directed on instruction sheet   ____ Use inhalers on the day of surgery and bring to hospital day of surgery  _X___ Stop Metformin 2 days prior to surgery-LAST DOSE ON Friday MAY 29TH    ____ Take 1/2 of usual insulin dose the night before surgery and none on the morning surgery.   _X___ Follow recommendations from Cardiologist, Pulmonologist or PCP regarding stopping Aspirin, Coumadin, Plavix ,Eliquis, Effient,  or Pradaxa, and Pletal-ASPIRIN WAS STOPPED LAST WEEK   X____Stop Anti-inflammatories such as Advil, Aleve, Ibuprofen, Motrin, Naproxen, Naprosyn, Goodies powders or aspirin products NOW- OK to take Tylenol    _x___ Stop supplements until after surgery-STOP MELATONIN NOW-MAY RESUME AFTER SURGERY   ____ Bring C-Pap to the hospital.

## 2018-07-03 ENCOUNTER — Other Ambulatory Visit: Payer: Self-pay

## 2018-07-03 ENCOUNTER — Other Ambulatory Visit
Admission: RE | Admit: 2018-07-03 | Discharge: 2018-07-03 | Disposition: A | Discharge status: Home / Self Care | Payer: Medicare Other | Source: Ambulatory Visit | Attending: General Surgery | Admitting: General Surgery

## 2018-07-03 DIAGNOSIS — Z1159 Encounter for screening for other viral diseases: Secondary | ICD-10-CM | POA: Diagnosis present

## 2018-07-03 NOTE — Pre-Procedure Instructions (Signed)
Messaged Dr Kayleen Memos regarding pt with new afib on ekg done in ED in April that was not addressed. Dr Kayleen Memos states pt needs clearance prior to 07-07-18 surgery. Called and spoke with Judeen Hammans at Dr Einar Crow office about this. Faxed clearance to her and she will fax this clearance to Dr Clayborn Bigness

## 2018-07-04 LAB — NOVEL CORONAVIRUS, NAA (HOSP ORDER, SEND-OUT TO REF LAB; TAT 18-24 HRS): SARS-CoV-2, NAA: NOT DETECTED

## 2018-07-04 NOTE — Pre-Procedure Instructions (Signed)
Called over to [facility] cardiology this afternoon inquiring about clearance-Office states pt was seen in their office yesterday and was there in the office while I was talking with nurse getting his echo done and Dr Ubaldo Glassing would clear pt based on echo results.  I called back over to [facility] cardiology now and the office is closed. Called over to Dr Einar Crow office and their office is closed as well.

## 2018-07-04 NOTE — Pre-Procedure Instructions (Signed)
ECG 12-lead5/28/2020 [place] Component Name Value Ref Range  Vent Rate (bpm) 71   PR Interval (msec) 290   QRS Interval (msec) 106   QT Interval (msec) 410   QTc (msec) 445   Other Result Information  This result has an attachment that is not available.  Result Narrative  Sinus rhythm 1st degree AV block with fusion complexes Left axis deviation Incomplete right bundle branch block Minimal voltage criteria for LVH, may be normal variant      No previous ECGs available I reviewed and concur with this report. Electronically signed PJ:KDTO MD, KEN ([id number]) on 07/03/2018 3:25:18 PM  Status Results Details

## 2018-07-04 NOTE — Progress Notes (Signed)
Review of office notes and review with echocardiogram suggest patient has preserved LV function with no evidence significant valvular abnormalities.  Patient is at low risk for surgery.  He has no exertional ischemia.  Echo showed preserved LV function.  Would proceed with surgery with routine cardiac monitoring.

## 2018-07-04 NOTE — Pre-Procedure Instructions (Signed)
Progress Notes - documented in this encounter Brent [name], [place] - 07/03/2018 11:30 AM EDT Formatting of this note might be different from the original. New Patient Visit   Chief Complaint: Chief Complaint  Patient presents with  . Establish Care  . Pre-op Exam  Date of Service: 07/03/2018 Date of Birth: August 17, 1945 PCP: [name], MD  History of Present Illness: Brent [name] is a 73 y.o.male patient with a past medical history significant for type 2 diabetes, hypertension, and hyperlipidemia who presents for preoperative cardiac clearance prior to laparoscopic cholecystectomy with Dr. Windell Moment scheduled for 07/07/18. He was recently admitted to [facility] in April of 2020 and ECG revealed atrial fibrillation - no prior history of atrial fibrillation per patient and records. Currently not on anticoagulation and no further workup for possible atrial fibrillation has been completed. Today, [name] denies chest pain, palpitations, shortness of breath, lower extremity swelling, orthopnea, PND, or syncopal/presyncopal episodes. He remains somewhat active, mowing yards and weed eating without difficulty.   I personally reviewed the ECG performed in the office today which revealed normal sinus rhythm with prolonged PR interval, consistent with 1st degree AV block. ECG performed in the hospital on 05/15/18 revealed possible atrial fibrillation vs. sinus arrhythmia with prolonged PR interval.   Past Medical and Surgical History  Past Medical History Past Medical History:  Diagnosis Date  . Diabetes mellitus type 2, uncomplicated (CMS-HCC)  AODM with mild neuropathy (A1C 5.3%- 12/2012)  . Hyperlipidemia  LDL 103- 12/2012  . Internal hemorrhoids 03/22/2014  . Obesity  . Tubular adenoma of colon, unspecified 03/22/2014   Past Surgical History He has a past surgical history that includes Splenectomy; Colonoscopy (03/22/2014); Endoscopic Carpal Tunnel Release (Left, 10/12/2015); and Colonoscopy  (08/05/2017).   Medications and Allergies  Current Medications Current Outpatient Medications  Medication Sig Dispense Refill  . aspirin 81 MG EC tablet Take 81 mg by mouth once daily.  Marland Kitchen atorvastatin (LIPITOR) 40 MG tablet Take 1 tablet by mouth once daily 90 tablet 0  . blood glucose diagnostic (CONTOUR NEXT STRIPS) test strip Use as directed. Check CBG's twice weekly. Dx: E11.49 50 each 11  . hydroCHLOROthiazide (HYDRODIURIL) 12.5 MG tablet Take 1 tablet by mouth once daily 90 tablet 0  . lancets (MICROLET LANCET) as directed. Check CBG's once daily. Dx: E11.40 50 each 11  . lisinopriL (ZESTRIL) 40 MG tablet Take 1 tablet by mouth once daily 90 tablet 0  . loratadine (CLARITIN) 10 mg tablet Take 10 mg by mouth once daily as needed  . metFORMIN (GLUCOPHAGE) 500 MG tablet Take 1 tablet (500 mg total) by mouth daily with breakfast 90 tablet 1  . sodium chloride 0.9% flush injection 5 mLs by Intracatheter route as directed for Line Care 30 each 2  . tadalafiL (CIALIS) 10 MG tablet Take 1 tablet (10 mg total) by mouth once daily as needed for Erectile Dysfunction for up to 30 days Take dose 30-45 min prior to anticipated sexual activity. 10 tablet 0   No current facility-administered medications for this visit.   Allergies Patient has no known allergies.  Social and Family History  Social History reports that he quit smoking about 25 years ago. His smoking use included cigarettes and cigars. He has a 30.00 pack-year smoking history. He has never used smokeless tobacco. He reports that he does not drink alcohol or use drugs.  Family History family history includes Colon polyps in his father; Diabetes type II in his father; Heart disease in his father;  Kidney disease in his father; No Known Problems in his mother, sister, and sister.   Review of Systems   Review of Systems: The patient denies chest pain, shortness of breath, orthopnea, paroxysmal nocturnal dyspnea, pedal edema,  palpitations, heart racing, fatigue, dizziness, lightheadedness, presyncope, syncope, leg pain, leg cramping. Review of 12 Systems is negative except as described in HPI.  Physical Examination   Vitals:BP 140/80  Pulse 73  Resp 16  Ht 172.7 cm (5\' 8" )  Wt 97.9 kg (215 lb 12.8 oz)  SpO2 98%  BMI 32.81 kg/m  Ht:172.7 cm (5\' 8" ) Wt:97.9 kg (215 lb 12.8 oz) RCV:KFMM surface area is 2.17 meters squared. Body mass index is 32.81 kg/m.  General: Well developed, well nourished. In no acute distress HEENT: Pupils equally reactive to light and accomodation  Neck: Supple without thyromegaly, or goiter. Carotid pulses 2+. No carotid bruits present.  Pulmonary: Clear to auscultation bilaterally; no wheezes, rales, rhonchi Cardiovascular: Regular rate and rhythm. No gallops, murmurs or rubs Gastrointestinal: Soft nontender, nondistended, with normal bowel sounds Extremities: No cyanosis, clubbing, or edema Peripheral Pulses: 2+ in upper extremities, 2+ in lower extremities  Neurology: Alert and oriented X3 Pysch: Good affect. Responds appropriately  Assessment   73 y.o. male with  1. New onset a-fib (CMS-HCC)  2. Essential hypertension  3. Type 2 diabetes mellitus with diabetic neuropathy, without long-term current use of insulin (A1c 6.7% - 04/03/18)  4. Pure hypercholesterolemia (LDL 89 - 04/03/18)  5. Preoperative clearance   Plan  1. New onset atrial fibrillation  -In normal sinus rhythm per ECG done in the office today; ECG in the hospital in April of this year revealed atrial fibrillation vs. sinus arrhythmia with prolonged PR interval -Following surgery, will place Holter monitor to determine atrial fibrillation burden, if any  -Will start Eliquis 5mg  BID if atrial fibrillation is present; CHADsVASc score of 3 -Echocardiogram scheduled for 07/04/18  2. Essential hypertension  -Continue HCTZ 12.5mg  daily and lisinopril 40mg  daily  3. Type 2 diabetes -Continue metformin 500mg  once  daily with routine follow up with PCP 4. Pure hypercholesterolemia  -Continue atorvastatin 40mg  once daily  5. Preoperative cardiac clearance  -Pending based on results from echocardiogram scheduled for 07/04/18  Orders Placed This Encounter  Procedures  . ECG 12-lead  . Echo complete   Return in about 3 weeks (around 07/24/2018).  I personally performed the service, non-incident to. (WP)   NICOLE Julian Hy, PA    Electronically signed by Brent Comfort, PA at 07/03/2018 12:17 PM EDT

## 2018-07-07 ENCOUNTER — Encounter: Payer: Self-pay | Admitting: *Deleted

## 2018-07-07 ENCOUNTER — Ambulatory Visit: Payer: Medicare Other | Admitting: Registered Nurse

## 2018-07-07 ENCOUNTER — Ambulatory Visit: Payer: Medicare Other

## 2018-07-07 ENCOUNTER — Other Ambulatory Visit: Payer: Self-pay

## 2018-07-07 ENCOUNTER — Ambulatory Visit
Admission: RE | Admit: 2018-07-07 | Discharge: 2018-07-07 | Disposition: A | Discharge status: Home / Self Care | Payer: Medicare Other | Attending: General Surgery | Admitting: General Surgery

## 2018-07-07 ENCOUNTER — Encounter
Admission: RE | Disposition: A | Discharge status: Home / Self Care | Payer: Self-pay | Source: Home / Self Care | Attending: General Surgery

## 2018-07-07 DIAGNOSIS — Z6832 Body mass index (BMI) 32.0-32.9, adult: Secondary | ICD-10-CM | POA: Insufficient documentation

## 2018-07-07 DIAGNOSIS — K8010 Calculus of gallbladder with chronic cholecystitis without obstruction: Secondary | ICD-10-CM | POA: Insufficient documentation

## 2018-07-07 DIAGNOSIS — E1140 Type 2 diabetes mellitus with diabetic neuropathy, unspecified: Secondary | ICD-10-CM | POA: Insufficient documentation

## 2018-07-07 DIAGNOSIS — E669 Obesity, unspecified: Secondary | ICD-10-CM | POA: Diagnosis not present

## 2018-07-07 DIAGNOSIS — Z7982 Long term (current) use of aspirin: Secondary | ICD-10-CM | POA: Diagnosis not present

## 2018-07-07 DIAGNOSIS — I10 Essential (primary) hypertension: Secondary | ICD-10-CM | POA: Insufficient documentation

## 2018-07-07 DIAGNOSIS — Z87891 Personal history of nicotine dependence: Secondary | ICD-10-CM | POA: Insufficient documentation

## 2018-07-07 DIAGNOSIS — E785 Hyperlipidemia, unspecified: Secondary | ICD-10-CM | POA: Diagnosis not present

## 2018-07-07 DIAGNOSIS — Z7984 Long term (current) use of oral hypoglycemic drugs: Secondary | ICD-10-CM | POA: Insufficient documentation

## 2018-07-07 DIAGNOSIS — Z79899 Other long term (current) drug therapy: Secondary | ICD-10-CM | POA: Diagnosis not present

## 2018-07-07 DIAGNOSIS — E7800 Pure hypercholesterolemia, unspecified: Secondary | ICD-10-CM | POA: Diagnosis not present

## 2018-07-07 DIAGNOSIS — Z09 Encounter for follow-up examination after completed treatment for conditions other than malignant neoplasm: Secondary | ICD-10-CM

## 2018-07-07 DIAGNOSIS — M1990 Unspecified osteoarthritis, unspecified site: Secondary | ICD-10-CM | POA: Insufficient documentation

## 2018-07-07 DIAGNOSIS — K811 Chronic cholecystitis: Secondary | ICD-10-CM | POA: Diagnosis present

## 2018-07-07 HISTORY — PX: CHOLECYSTECTOMY: SHX55

## 2018-07-07 LAB — GLUCOSE, CAPILLARY
Glucose-Capillary: 148 mg/dL — ABNORMAL HIGH (ref 70–99)
Glucose-Capillary: 234 mg/dL — ABNORMAL HIGH (ref 70–99)

## 2018-07-07 SURGERY — LAPAROSCOPIC CHOLECYSTECTOMY
Anesthesia: General

## 2018-07-07 MED ORDER — SUGAMMADEX SODIUM 200 MG/2ML IV SOLN
INTRAVENOUS | Status: DC | PRN
  Administered 2018-07-07: 200 mg via INTRAVENOUS

## 2018-07-07 MED ORDER — SUCCINYLCHOLINE CHLORIDE 20 MG/ML IJ SOLN
INTRAMUSCULAR | Status: AC
  Filled 2018-07-07: qty 1

## 2018-07-07 MED ORDER — FENTANYL CITRATE (PF) 100 MCG/2ML IJ SOLN
INTRAMUSCULAR | Status: AC
  Filled 2018-07-07: qty 2

## 2018-07-07 MED ORDER — SODIUM CHLORIDE (PF) 0.9 % IJ SOLN
INTRAMUSCULAR | Status: AC
  Filled 2018-07-07: qty 30

## 2018-07-07 MED ORDER — SUCCINYLCHOLINE CHLORIDE 20 MG/ML IJ SOLN
INTRAMUSCULAR | Status: DC | PRN
  Administered 2018-07-07: 100 mg via INTRAVENOUS

## 2018-07-07 MED ORDER — BUPIVACAINE-EPINEPHRINE (PF) 0.5% -1:200000 IJ SOLN
INTRAMUSCULAR | Status: DC | PRN
  Administered 2018-07-07: 12 mL

## 2018-07-07 MED ORDER — PROPOFOL 10 MG/ML IV BOLUS
INTRAVENOUS | Status: AC
  Filled 2018-07-07: qty 20

## 2018-07-07 MED ORDER — FENTANYL CITRATE (PF) 100 MCG/2ML IJ SOLN
25.0000 ug | INTRAMUSCULAR | Status: DC | PRN
  Administered 2018-07-07 (×2): 50 ug via INTRAVENOUS

## 2018-07-07 MED ORDER — LIDOCAINE HCL (PF) 2 % IJ SOLN
INTRAMUSCULAR | Status: AC
  Filled 2018-07-07: qty 10

## 2018-07-07 MED ORDER — CEFAZOLIN SODIUM-DEXTROSE 2-4 GM/100ML-% IV SOLN
2.0000 g | INTRAVENOUS | Status: AC
  Administered 2018-07-07: 2 g via INTRAVENOUS

## 2018-07-07 MED ORDER — ONDANSETRON HCL 4 MG/2ML IJ SOLN
INTRAMUSCULAR | Status: DC | PRN
  Administered 2018-07-07: 4 mg via INTRAVENOUS

## 2018-07-07 MED ORDER — OXYCODONE HCL 5 MG PO TABS: 5.0000 mg | ORAL_TABLET | Freq: Once | ORAL | Status: DC | PRN

## 2018-07-07 MED ORDER — FAMOTIDINE 20 MG PO TABS
20.0000 mg | ORAL_TABLET | Freq: Once | ORAL | Status: AC
  Administered 2018-07-07: 07:00:00 20 mg via ORAL

## 2018-07-07 MED ORDER — ROCURONIUM BROMIDE 100 MG/10ML IV SOLN
INTRAVENOUS | Status: DC | PRN
  Administered 2018-07-07: 10 mg via INTRAVENOUS
  Administered 2018-07-07: 30 mg via INTRAVENOUS
  Administered 2018-07-07: 10 mg via INTRAVENOUS

## 2018-07-07 MED ORDER — SODIUM CHLORIDE 0.9 % IV SOLN
INTRAVENOUS | Status: DC
  Administered 2018-07-07: 07:00:00 via INTRAVENOUS

## 2018-07-07 MED ORDER — EPHEDRINE SULFATE 50 MG/ML IJ SOLN
INTRAMUSCULAR | Status: AC
  Filled 2018-07-07: qty 1

## 2018-07-07 MED ORDER — GLYCOPYRROLATE 0.2 MG/ML IJ SOLN
INTRAMUSCULAR | Status: AC
  Filled 2018-07-07: qty 2

## 2018-07-07 MED ORDER — OXYCODONE HCL 5 MG/5ML PO SOLN: 5.0000 mg | Freq: Once | ORAL | Status: DC | PRN

## 2018-07-07 MED ORDER — CEFAZOLIN SODIUM-DEXTROSE 2-4 GM/100ML-% IV SOLN
INTRAVENOUS | Status: AC
  Filled 2018-07-07: qty 100

## 2018-07-07 MED ORDER — PROPOFOL 10 MG/ML IV BOLUS
INTRAVENOUS | Status: DC | PRN
  Administered 2018-07-07: 180 mg via INTRAVENOUS

## 2018-07-07 MED ORDER — DEXMEDETOMIDINE HCL IN NACL 200 MCG/50ML IV SOLN
INTRAVENOUS | Status: DC | PRN
  Administered 2018-07-07: 8 ug via INTRAVENOUS
  Administered 2018-07-07: 4 ug via INTRAVENOUS
  Administered 2018-07-07: 8 ug via INTRAVENOUS

## 2018-07-07 MED ORDER — BUPIVACAINE-EPINEPHRINE (PF) 0.5% -1:200000 IJ SOLN
INTRAMUSCULAR | Status: AC
  Filled 2018-07-07: qty 30

## 2018-07-07 MED ORDER — INSULIN ASPART 100 UNIT/ML ~~LOC~~ SOLN
SUBCUTANEOUS | Status: AC
  Filled 2018-07-07: qty 1

## 2018-07-07 MED ORDER — INSULIN ASPART 100 UNIT/ML ~~LOC~~ SOLN
3.0000 [IU] | Freq: Once | SUBCUTANEOUS | Status: AC
  Administered 2018-07-07: 3 [IU] via SUBCUTANEOUS

## 2018-07-07 MED ORDER — HYDROCODONE-ACETAMINOPHEN 5-325 MG PO TABS: 1.0000 | ORAL_TABLET | ORAL | 0 refills | Status: AC | PRN

## 2018-07-07 MED ORDER — LIDOCAINE HCL (CARDIAC) PF 100 MG/5ML IV SOSY
PREFILLED_SYRINGE | INTRAVENOUS | Status: DC | PRN
  Administered 2018-07-07: 100 mg via INTRAVENOUS

## 2018-07-07 MED ORDER — LACTATED RINGERS IV SOLN
INTRAVENOUS | Status: DC | PRN
  Administered 2018-07-07 (×2): via INTRAVENOUS

## 2018-07-07 MED ORDER — FENTANYL CITRATE (PF) 100 MCG/2ML IJ SOLN
INTRAMUSCULAR | Status: DC | PRN
  Administered 2018-07-07 (×2): 50 ug via INTRAVENOUS
  Administered 2018-07-07: 100 ug via INTRAVENOUS

## 2018-07-07 MED ORDER — FAMOTIDINE 20 MG PO TABS
ORAL_TABLET | ORAL | Status: AC
  Administered 2018-07-07: 07:00:00 20 mg via ORAL
  Filled 2018-07-07: qty 1

## 2018-07-07 SURGICAL SUPPLY — 44 items
APPLIER CLIP 5 13 M/L LIGAMAX5 (MISCELLANEOUS) ×3
BLADE SURG SZ11 CARB STEEL (BLADE) ×3 IMPLANT
CANISTER SUCT 1200ML W/VALVE (MISCELLANEOUS) ×3 IMPLANT
CATH CHOLANG 76X19 KUMAR (CATHETERS) ×3 IMPLANT
CHLORAPREP W/TINT 26 (MISCELLANEOUS) ×3 IMPLANT
CLIP APPLIE 5 13 M/L LIGAMAX5 (MISCELLANEOUS) ×1 IMPLANT
COVER WAND RF STERILE (DRAPES) ×3 IMPLANT
CUTTER FLEX LINEAR 45M (STAPLE) ×3 IMPLANT
DERMABOND ADVANCED (GAUZE/BANDAGES/DRESSINGS) ×2
DERMABOND ADVANCED .7 DNX12 (GAUZE/BANDAGES/DRESSINGS) ×1 IMPLANT
DRAPE C-ARM 42X72 X-RAY (DRAPES) ×3 IMPLANT
ELECT REM PT RETURN 9FT ADLT (ELECTROSURGICAL) ×3
ELECTRODE REM PT RTRN 9FT ADLT (ELECTROSURGICAL) ×1 IMPLANT
GAUZE 4X4 16PLY RFD (DISPOSABLE) ×3 IMPLANT
GLOVE BIO SURGEON STRL SZ 6.5 (GLOVE) ×2 IMPLANT
GLOVE BIO SURGEONS STRL SZ 6.5 (GLOVE) ×1
GLOVE INDICATOR 6.5 STRL GRN (GLOVE) ×3 IMPLANT
GOWN STRL REUS W/ TWL LRG LVL3 (GOWN DISPOSABLE) ×4 IMPLANT
GOWN STRL REUS W/TWL LRG LVL3 (GOWN DISPOSABLE) ×8
GRASPER SUT TROCAR 14GX15 (MISCELLANEOUS) IMPLANT
HEMOSTAT SURGICEL 2X3 (HEMOSTASIS) IMPLANT
IRRIGATION STRYKERFLOW (MISCELLANEOUS) ×1 IMPLANT
IRRIGATOR STRYKERFLOW (MISCELLANEOUS) ×3
IV NS 1000ML (IV SOLUTION) ×4
IV NS 1000ML BAXH (IV SOLUTION) ×2 IMPLANT
KIT TURNOVER KIT A (KITS) ×3 IMPLANT
LABEL OR SOLS (LABEL) ×3 IMPLANT
NEEDLE HYPO 25X1 1.5 SAFETY (NEEDLE) ×3 IMPLANT
NEEDLE INSUFFLATION 14GA 120MM (NEEDLE) ×3 IMPLANT
NS IRRIG 500ML POUR BTL (IV SOLUTION) ×3 IMPLANT
PACK LAP CHOLECYSTECTOMY (MISCELLANEOUS) ×3 IMPLANT
POUCH SPECIMEN RETRIEVAL 10MM (ENDOMECHANICALS) ×6 IMPLANT
RELOAD STAPLE TA45 3.5 REG BLU (ENDOMECHANICALS) ×6 IMPLANT
SCISSORS METZENBAUM CVD 33 (INSTRUMENTS) ×3 IMPLANT
SET TUBE SMOKE EVAC HIGH FLOW (TUBING) ×3 IMPLANT
SLEEVE ENDOPATH XCEL 5M (ENDOMECHANICALS) ×6 IMPLANT
SPONGE GAUZE 2X2 8PLY STER LF (GAUZE/BANDAGES/DRESSINGS) ×1
SPONGE GAUZE 2X2 8PLY STRL LF (GAUZE/BANDAGES/DRESSINGS) ×2 IMPLANT
SUT MNCRL AB 4-0 PS2 18 (SUTURE) ×6 IMPLANT
SUT VIC AB 0 CT1 36 (SUTURE) IMPLANT
SUT VICRYL 0 AB UR-6 (SUTURE) IMPLANT
TROCAR XCEL 12X100 BLDLESS (ENDOMECHANICALS) ×3 IMPLANT
TROCAR XCEL NON-BLD 11X100MML (ENDOMECHANICALS) ×3 IMPLANT
TROCAR XCEL NON-BLD 5MMX100MML (ENDOMECHANICALS) ×3 IMPLANT

## 2018-07-07 NOTE — Interval H&P Note (Signed)
History and Physical Interval Note:  07/07/2018 6:56 AM  Wynona Canes.  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  The various methods of treatment have been discussed with the patient and family. After consideration of risks, benefits and other options for treatment, the patient has consented to  Procedure(s): LAPAROSCOPIC CHOLECYSTECTOMY, CHOLANGIOGRAM (N/A) as a surgical intervention.  The patient's history has been reviewed, patient examined, no change in status, stable for surgery.  I have reviewed the patient's chart and labs.  Questions were answered to the patient's satisfaction.     [name]

## 2018-07-07 NOTE — Discharge Instructions (Signed)
General Anesthesia, Adult, Care After This sheet gives you information about how to care for yourself after your procedure. Your health care provider may also give you more specific instructions. If you have problems or questions, contact your health care provider. What can I expect after the procedure? After the procedure, the following side effects are common:  Pain or discomfort at the IV site.  Nausea.  Vomiting.  Sore throat.  Trouble concentrating.  Feeling cold or chills.  Weak or tired.  Sleepiness and fatigue.  Soreness and body aches. These side effects can affect parts of the body that were not involved in surgery. Follow these instructions at home:  For at least 24 hours after the procedure:  Have a responsible adult stay with you. It is important to have someone help care for you until you are awake and alert.  Rest as needed.  Do not: ? Participate in activities in which you could fall or become injured. ? Drive. ? Use heavy machinery. ? Drink alcohol. ? Take sleeping pills or medicines that cause drowsiness. ? Make important decisions or sign legal documents. ? Take care of children on your own. Eating and drinking  Follow any instructions from your health care provider about eating or drinking restrictions.  When you feel hungry, start by eating small amounts of foods that are soft and easy to digest (bland), such as toast. Gradually return to your regular diet.  Drink enough fluid to keep your urine pale yellow.  If you vomit, rehydrate by drinking water, juice, or clear broth. General instructions  If you have sleep apnea, surgery and certain medicines can increase your risk for breathing problems. Follow instructions from your health care provider about wearing your sleep device: ? Anytime you are sleeping, including during daytime naps. ? While taking prescription pain medicines, sleeping medicines, or medicines that make you  drowsy.  Return to your normal activities as told by your health care provider. Ask your health care provider what activities are safe for you.  Take over-the-counter and prescription medicines only as told by your health care provider.  If you smoke, do not smoke without supervision.  Keep all follow-up visits as told by your health care provider. This is important. Contact a health care provider if:  You have nausea or vomiting that does not get better with medicine.  You cannot eat or drink without vomiting.  You have pain that does not get better with medicine.  You are unable to pass urine.  You develop a skin rash.  You have a fever.  You have redness around your IV site that gets worse. Get help right away if:  You have difficulty breathing.  You have chest pain.  You have blood in your urine or stool, or you vomit blood. Summary  After the procedure, it is common to have a sore throat or nausea. It is also common to feel tired.  Have a responsible adult stay with you for the first 24 hours after general anesthesia. It is important to have someone help care for you until you are awake and alert.  When you feel hungry, start by eating small amounts of foods that are soft and easy to digest (bland), such as toast. Gradually return to your regular diet.  Drink enough fluid to keep your urine pale yellow.  Return to your normal activities as told by your health care provider. Ask your health care provider what activities are safe for you. This information is  not intended to replace advice given to you by your health care provider. Make sure you discuss any questions you have with your health care provider. Document Released: 04/30/2000 Document Revised: 09/07/2016 Document Reviewed: 09/07/2016 Elsevier Interactive Patient Education  2019 [employer].  Diet: Resume home heart healthy regular diet.   Activity: No heavy lifting >20 pounds (children, pets, laundry,  garbage) or strenuous activity until follow-up, but light activity and walking are encouraged. Do not drive or drink alcohol if taking narcotic pain medications.  Wound care: Remove dressing tomorrow. Once dressing removed, may shower with soapy water and pat dry (do not rub incisions), but no baths or submerging incision underwater until follow-up. (no swimming)   Medications: Resume all home medications. For mild to moderate pain: acetaminophen (Tylenol) or ibuprofen (if no kidney disease). Combining Tylenol with alcohol can substantially increase your risk of causing liver disease. Narcotic pain medications, if prescribed, can be used for severe pain, though may cause nausea, constipation, and drowsiness. Do not combine Tylenol and Norco within a 6 hour period as Norco contains Tylenol. If you do not need the narcotic pain medication, you do not need to fill the prescription.  Call office [phone]) at any time if any questions, worsening pain, fevers/chills, bleeding, drainage from incision site, or other concerns.

## 2018-07-07 NOTE — Anesthesia Postprocedure Evaluation (Signed)
Anesthesia Post Note  Patient: Brent [name].  Procedure(s) Performed: LAPAROSCOPIC CHOLECYSTECTOMY (N/A )  Patient location during evaluation: PACU Anesthesia Type: General Level of consciousness: awake and alert Pain management: pain level controlled Vital Signs Assessment: post-procedure vital signs reviewed and stable Respiratory status: spontaneous breathing, nonlabored ventilation, respiratory function stable and patient connected to nasal cannula oxygen Cardiovascular status: blood pressure returned to baseline and stable Postop Assessment: no apparent nausea or vomiting Anesthetic complications: no     Last Vitals:  Vitals:   07/07/18 1200 07/07/18 1222  BP:  (!) 155/65  Pulse: 63 65  Resp: (!) 24 20  Temp:  (!) 36.4 C  SpO2: 94%     Last Pain:  Vitals:   07/07/18 1222  TempSrc: Temporal  PainSc: 4                  Broadus John K Parsa Rickett

## 2018-07-07 NOTE — Anesthesia Procedure Notes (Signed)
Procedure Name: Intubation Date/Time: 07/07/2018 7:47 AM Performed by: Leander Rams, CRNA Pre-anesthesia Checklist: Patient identified, Emergency Drugs available, Suction available, Patient being monitored and Timeout performed Patient Re-evaluated:Patient Re-evaluated prior to induction Oxygen Delivery Method: Circle system utilized Preoxygenation: Pre-oxygenation with 100% oxygen Ventilation: Mask ventilation without difficulty Laryngoscope Size: Mac and 4 Tube type: Oral Tube size: 7.5 mm Number of attempts: 1 Airway Equipment and Method: Stylet Placement Confirmation: ETT inserted through vocal cords under direct vision,  positive ETCO2 and breath sounds checked- equal and bilateral Secured at: 23 cm Tube secured with: Tape Dental Injury: Teeth and Oropharynx as per pre-operative assessment

## 2018-07-07 NOTE — Transfer of Care (Signed)
Immediate Anesthesia Transfer of Care Note  Patient: Brent [name].  Procedure(s) Performed: LAPAROSCOPIC CHOLECYSTECTOMY (N/A )  Patient Location: PACU  Anesthesia Type:General  Level of Consciousness: awake  Airway & Oxygen Therapy: Patient Spontanous Breathing  Post-op Assessment: Report given to RN  Post vital signs: stable  Last Vitals:  Vitals Value Taken Time  BP    Temp    Pulse    Resp    SpO2      Last Pain:  Vitals:   07/07/18 0631  TempSrc: Tympanic  PainSc: 0-No pain         Complications: No apparent anesthesia complications

## 2018-07-07 NOTE — Anesthesia Preprocedure Evaluation (Signed)
Anesthesia Evaluation  Patient identified by MRN, date of birth, ID band Patient awake    Reviewed: Allergy & Precautions, H&P , NPO status , Patient's Chart, lab work & pertinent test results  History of Anesthesia Complications Negative for: history of anesthetic complications  Airway Mallampati: III  TM Distance: <3 FB Neck ROM: full    Dental  (+) Chipped   Pulmonary neg shortness of breath, former smoker,           Cardiovascular Exercise Tolerance: Good hypertension, (-) angina(-) Past MI and (-) DOE      Neuro/Psych negative neurological ROS  negative psych ROS   GI/Hepatic Neg liver ROS, PUD, GERD  Medicated and Controlled,  Endo/Other  diabetes, Type 2, Oral Hypoglycemic Agents  Renal/GU      Musculoskeletal  (+) Arthritis ,   Abdominal   Peds  Hematology negative hematology ROS (+)   Anesthesia Other Findings Past Medical History: No date: Arthritis     Comment:  FINGERS No date: Diabetes mellitus without complication (HCC) No date: GERD (gastroesophageal reflux disease)     Comment:  RARE No date: Hyperlipidemia No date: Hypertension No date: Spherocytosis, hereditary ([place])     Comment:  HAD SPLEEN REMOVED DUE TO THIS IN 7TH GRADE  Past Surgical History: 10/2015: CARPAL TUNNEL RELEASE; Left No date: COLONOSCOPY 08/05/2017: COLONOSCOPY WITH PROPOFOL; N/A     Comment:  Procedure: COLONOSCOPY WITH PROPOFOL;  Surgeon: Manya Silvas, MD;  Location: [facility] ENDOSCOPY;  Service:               Endoscopy;  Laterality: N/A; 05/16/2018: ENDOSCOPIC RETROGRADE CHOLANGIOPANCREATOGRAPHY (ERCP) WITH  PROPOFOL; N/A     Comment:  Procedure: ENDOSCOPIC RETROGRADE               CHOLANGIOPANCREATOGRAPHY (ERCP) WITH PROPOFOL;  Surgeon:               Lucilla Lame, MD;  Location: [facility] ENDOSCOPY;  Service:               Endoscopy;  Laterality: N/A; No date: SPLENECTOMY  BMI    Body Mass Index:  32.69  kg/m      Reproductive/Obstetrics negative OB ROS                             Anesthesia Physical Anesthesia Plan  ASA: III  Anesthesia Plan: General ETT   Post-op Pain Management:    Induction: Intravenous  PONV Risk Score and Plan: Ondansetron, Dexamethasone, Midazolam and Treatment may vary due to age or medical condition  Airway Management Planned: Oral ETT  Additional Equipment:   Intra-op Plan:   Post-operative Plan: Extubation in OR  Informed Consent: I have reviewed the patients History and Physical, chart, labs and discussed the procedure including the risks, benefits and alternatives for the proposed anesthesia with the patient or authorized representative who has indicated his/her understanding and acceptance.     Dental Advisory Given  Plan Discussed with: Anesthesiologist, CRNA and Surgeon  Anesthesia Plan Comments: (Patient consented for risks of anesthesia including but not limited to:  - adverse reactions to medications - damage to teeth, lips or other oral mucosa - sore throat or hoarseness - Damage to heart, brain, lungs or loss of life  Patient voiced understanding.)        Anesthesia Quick Evaluation

## 2018-07-07 NOTE — Op Note (Signed)
Preoperative diagnosis: Chronic cholecystitis.  Postoperative diagnosis: Chronic cholecystitis.  Procedure: Laparoscopic Cholecystectomy.   Anesthesia: GETA   Surgeon: Dr. Windell Moment  Wound Classification: Clean Contaminated  Indications: Patient is a 73 y.o. male developed right upper quadrant pain, nausea, leukocytosis and on workup was found to have cholelithiasis with a normal common duct and cholecystitis but due to the timing of pain onset and patient presentation, cholecystostomy was done initially.  Patient recovered.  8 weeks after drainage laparoscopic cholecystectomy was elected for final treatment of chronic cholecystitis.   Findings: -Severe chronic inflammation of the gallbladder with thick omentum and peritoneum covering the gallbladder. -Very friable gallbladder wall with chronic purulence inside the gallbladder. -Even though the critical view of safety was not achieved because the liver was not seen, 2 structures going into the gallbladder were identified. -Adequate hemostasis  Description of procedure: The patient was placed on the operating table in the supine position. General anesthesia was induced. A time-out was completed verifying correct patient, procedure, site, positioning, and implant(s) and/or special equipment prior to beginning this procedure. An orogastric tube was placed. The abdomen was prepped and draped in the usual sterile fashion.  An incision was made in a natural skin line above the umbilicus.  The fascia was elevated and the Veress needle inserted. Proper position was confirmed by aspiration and saline meniscus test.  The abdomen was insufflated with carbon dioxide to a pressure of 15 mmHg. The patient tolerated insufflation well. A 11-mm trocar was then inserted.  The laparoscope was inserted and the abdomen inspected. No injuries from initial trocar placement were noted. Additional trocars were then inserted in the following locations: a 5-mm  trocar in the right epigastrium and two 5-mm trocars along the right costal margin. The abdomen was inspected and no abnormalities were found. The table was placed in the reverse Trendelenburg position with the right side up.  Thick omental and peritoneal adhesions were lysed carefully.  This part of the procedure to longer due to the difficulty of the adhesions and inflammation around the appendix. The dome of the gallbladder was attempted to be grasped with an atraumatic grasper but the gallbladder wall was too friable and broke easily.  Careful dissection was continued downward until the cystic artery was identified.  Continue very carefully the dissection was continued until the cystic duct was able to be dissected.  During the whole procedure he was very difficult to have the gallbladder wall retraction due to the amount of inflammation and friability of the gallbladder wall.  There was significant spillage of very teeny tiny stones.  The cholecystostomy tube was pulled out to be able to retract gallbladder cephalad. The cystic artery was then doubly clipped and divided close to the gallbladder first.  A 12 mm trocar was exchanged on the epigastric area to be able to use a laparoscopic linear cutter.  The cystic duct was then divided with linear cutter. The gallbladder was then dissected from its peritoneal attachments by electrocautery. Hemostasis was checked and the gallbladder and contained stones were removed using an endoscopic retrieval bag placed through the umbilical port. The gallbladder was passed off the table as a specimen. The gallbladder fossa was copiously irrigated with saline and hemostasis was obtained.  Large amount of very small stones were suctioned and until almost all of them were removed.  The right upper quadrant was irrigated until clean saline was aspirated. The laparoscope was withdrawn and the umbilical trocar removed. The abdomen was allowed to collapse. The  fascia of the 14mm  and 12 mm trocar were closed with figure-of-eight 0 vicryl sutures.  The skin of these 2 wounds were closed with interrupted nylon.  The other 2 skin wounds were closed with subcuticular sutures of 4-0 monocryl and topical skin adhesive. The orogastric tube was removed.  The patient tolerated the procedure well and was taken to the postanesthesia care unit in stable condition.   Specimen: Gallbladder  Complications: None  EBL: 39mL

## 2018-07-07 NOTE — Anesthesia Post-op Follow-up Note (Signed)
Anesthesia QCDR form completed.        

## 2018-07-09 LAB — SURGICAL PATHOLOGY

## 2018-07-12 ENCOUNTER — Other Ambulatory Visit: Payer: Self-pay | Admitting: General Surgery

## 2018-07-12 MED ORDER — AMOXICILLIN-POT CLAVULANATE 875-125 MG PO TABS: 1.0000 | ORAL_TABLET | Freq: Two times a day (BID) | ORAL | 0 refills | Status: DC

## 2018-07-12 NOTE — Progress Notes (Signed)
Patient with seroranguinolent drainage per umbilical wound with mild erythema. Highest temp 99.7 Will order oral antibiotic. Patient and his caregiver oriented that if patient develop fever or the wound gets worse, will need to go to ED for evaluation. They refers they understood.

## 2018-07-16 ENCOUNTER — Encounter: Payer: Self-pay | Admitting: *Deleted

## 2018-07-16 ENCOUNTER — Inpatient Hospital Stay
Admission: AD | Admit: 2018-07-16 | Discharge: 2018-07-21 | DRG: 862 | Disposition: A | Discharge status: Home Health | Payer: Medicare Other | Attending: General Surgery | Admitting: General Surgery

## 2018-07-16 ENCOUNTER — Other Ambulatory Visit: Payer: Self-pay

## 2018-07-16 DIAGNOSIS — Y836 Removal of other organ (partial) (total) as the cause of abnormal reaction of the patient, or of later complication, without mention of misadventure at the time of the procedure: Secondary | ICD-10-CM | POA: Diagnosis present

## 2018-07-16 DIAGNOSIS — Z7984 Long term (current) use of oral hypoglycemic drugs: Secondary | ICD-10-CM

## 2018-07-16 DIAGNOSIS — Z87891 Personal history of nicotine dependence: Secondary | ICD-10-CM

## 2018-07-16 DIAGNOSIS — L03316 Cellulitis of umbilicus: Secondary | ICD-10-CM | POA: Diagnosis present

## 2018-07-16 DIAGNOSIS — E860 Dehydration: Secondary | ICD-10-CM | POA: Diagnosis present

## 2018-07-16 DIAGNOSIS — Z8719 Personal history of other diseases of the digestive system: Secondary | ICD-10-CM

## 2018-07-16 DIAGNOSIS — E1140 Type 2 diabetes mellitus with diabetic neuropathy, unspecified: Secondary | ICD-10-CM | POA: Diagnosis present

## 2018-07-16 DIAGNOSIS — Z20828 Contact with and (suspected) exposure to other viral communicable diseases: Secondary | ICD-10-CM | POA: Diagnosis present

## 2018-07-16 DIAGNOSIS — T8149XA Infection following a procedure, other surgical site, initial encounter: Principal | ICD-10-CM | POA: Diagnosis present

## 2018-07-16 DIAGNOSIS — E785 Hyperlipidemia, unspecified: Secondary | ICD-10-CM | POA: Diagnosis present

## 2018-07-16 DIAGNOSIS — Z79899 Other long term (current) drug therapy: Secondary | ICD-10-CM

## 2018-07-16 DIAGNOSIS — Z9049 Acquired absence of other specified parts of digestive tract: Secondary | ICD-10-CM

## 2018-07-16 DIAGNOSIS — K651 Peritoneal abscess: Secondary | ICD-10-CM

## 2018-07-16 DIAGNOSIS — N179 Acute kidney failure, unspecified: Secondary | ICD-10-CM | POA: Diagnosis not present

## 2018-07-16 DIAGNOSIS — E876 Hypokalemia: Secondary | ICD-10-CM | POA: Diagnosis not present

## 2018-07-16 DIAGNOSIS — Z7982 Long term (current) use of aspirin: Secondary | ICD-10-CM

## 2018-07-16 DIAGNOSIS — Z9081 Acquired absence of spleen: Secondary | ICD-10-CM

## 2018-07-16 LAB — MRSA PCR SCREENING: MRSA by PCR: NEGATIVE

## 2018-07-16 LAB — GLUCOSE, CAPILLARY: Glucose-Capillary: 195 mg/dL — ABNORMAL HIGH (ref 70–99)

## 2018-07-16 LAB — SARS CORONAVIRUS 2 BY RT PCR (HOSPITAL ORDER, PERFORMED IN ~~LOC~~ HOSPITAL LAB): SARS Coronavirus 2: NEGATIVE

## 2018-07-16 MED ORDER — ACETAMINOPHEN 650 MG RE SUPP: 650.0000 mg | Freq: Four times a day (QID) | RECTAL | Status: DC | PRN

## 2018-07-16 MED ORDER — ENOXAPARIN SODIUM 40 MG/0.4ML ~~LOC~~ SOLN
40.0000 mg | SUBCUTANEOUS | Status: DC
  Administered 2018-07-16 – 2018-07-20 (×5): 40 mg via SUBCUTANEOUS
  Filled 2018-07-16 (×5): qty 0.4

## 2018-07-16 MED ORDER — ONDANSETRON HCL 4 MG/2ML IJ SOLN: 4.0000 mg | Freq: Four times a day (QID) | INTRAMUSCULAR | Status: DC | PRN

## 2018-07-16 MED ORDER — HYDROCODONE-ACETAMINOPHEN 5-325 MG PO TABS
1.0000 | ORAL_TABLET | ORAL | Status: DC | PRN
  Administered 2018-07-16: 2 via ORAL
  Filled 2018-07-16: qty 2
  Filled 2018-07-16: qty 1

## 2018-07-16 MED ORDER — ONDANSETRON 4 MG PO TBDP: 4.0000 mg | ORAL_TABLET | Freq: Four times a day (QID) | ORAL | Status: DC | PRN

## 2018-07-16 MED ORDER — MORPHINE SULFATE (PF) 4 MG/ML IV SOLN: 4.0000 mg | INTRAVENOUS | Status: DC | PRN

## 2018-07-16 MED ORDER — ACETAMINOPHEN 325 MG PO TABS
650.0000 mg | ORAL_TABLET | Freq: Four times a day (QID) | ORAL | Status: DC | PRN
  Administered 2018-07-18 – 2018-07-19 (×2): 650 mg via ORAL
  Filled 2018-07-16 (×2): qty 2

## 2018-07-16 MED ORDER — SODIUM CHLORIDE 0.9% FLUSH: 10.0000 mL | INTRAVENOUS | Status: DC | PRN

## 2018-07-16 MED ORDER — SODIUM CHLORIDE 0.9 % IV SOLN
INTRAVENOUS | Status: DC
  Administered 2018-07-16 – 2018-07-20 (×7): via INTRAVENOUS

## 2018-07-16 MED ORDER — PANTOPRAZOLE SODIUM 40 MG IV SOLR
40.0000 mg | Freq: Every day | INTRAVENOUS | Status: DC
  Administered 2018-07-16 – 2018-07-19 (×4): 40 mg via INTRAVENOUS
  Filled 2018-07-16 (×5): qty 40

## 2018-07-16 MED ORDER — MELATONIN 5 MG PO TABS
10.0000 mg | ORAL_TABLET | Freq: Every evening | ORAL | Status: DC | PRN
  Filled 2018-07-16: qty 2

## 2018-07-16 MED ORDER — INSULIN ASPART 100 UNIT/ML ~~LOC~~ SOLN
0.0000 [IU] | Freq: Three times a day (TID) | SUBCUTANEOUS | Status: DC
  Administered 2018-07-17: 2 [IU] via SUBCUTANEOUS
  Administered 2018-07-17 – 2018-07-18 (×3): 3 [IU] via SUBCUTANEOUS
  Administered 2018-07-18: 2 [IU] via SUBCUTANEOUS
  Administered 2018-07-19: 3 [IU] via SUBCUTANEOUS
  Administered 2018-07-19: 2 [IU] via SUBCUTANEOUS
  Administered 2018-07-19: 3 [IU] via SUBCUTANEOUS
  Administered 2018-07-20 (×2): 2 [IU] via SUBCUTANEOUS
  Administered 2018-07-20 – 2018-07-21 (×2): 3 [IU] via SUBCUTANEOUS
  Administered 2018-07-21: 2 [IU] via SUBCUTANEOUS
  Filled 2018-07-16 (×13): qty 1

## 2018-07-16 MED ORDER — PIPERACILLIN-TAZOBACTAM 3.375 G IVPB
3.3750 g | Freq: Three times a day (TID) | INTRAVENOUS | Status: DC
  Administered 2018-07-16 – 2018-07-21 (×14): 3.375 g via INTRAVENOUS
  Filled 2018-07-16 (×15): qty 50

## 2018-07-16 MED ORDER — SODIUM CHLORIDE 0.9% FLUSH
10.0000 mL | Freq: Two times a day (BID) | INTRAVENOUS | Status: DC
  Administered 2018-07-20 (×2): 10 mL

## 2018-07-16 NOTE — H&P (Signed)
HISTORY OF PRESENT ILLNESS:    [name] is a 73 y.o.male patient who comes for follow up after laparoscopic cholecystectomy.  Patient with a very difficult laparoscopic cholecystectomy due to necrotizing cholecystitis.  Patient called 5 days ago complaining of wound drainage.  He was started on oral antibiotics.  Today today I called him and he reports that he was feeling better.  Today the daughter called that patient has not been eating well.  Denies fever or chills.  Reports nausea but no vomiting.      PAST MEDICAL HISTORY:      Past Medical History:  Diagnosis Date  . 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 Hyperlipidemia    LDL 103- 12/2012  . Internal hemorrhoids 03/22/2014  . Obesity   . Tubular adenoma of colon, unspecified 03/22/2014        PAST SURGICAL HISTORY:        Past Surgical History:  Procedure Laterality Date  . CHOLECYSTECTOMY  07/07/2018   Dr Lesli Albee  . COLONOSCOPY  03/22/2014   Dr. Dorthy Cooler @ [facility] - Tubular Adenomas, rpt 53yrs per [facility]  . COLONOSCOPY  08/05/2017   Tubular Adenoma: CBF 08/2020  . ENDOSCOPIC CARPAL TUNNEL RELEASE Left 10/12/2015   Dr.Menz  . SPLENECTOMY           MEDICATIONS:  EncounterMedications        Outpatient Encounter Medications as of 07/16/2018  Medication Sig Dispense Refill  . aspirin 81 MG EC tablet Take 81 mg by mouth once daily.    Marland Kitchen atorvastatin (LIPITOR) 40 MG tablet Take 1 tablet by mouth once daily 90 tablet 0  . blood glucose diagnostic (CONTOUR NEXT STRIPS) test strip Use as directed. Check CBG's twice weekly. Dx: E11.49 50 each 11  . hydroCHLOROthiazide (HYDRODIURIL) 12.5 MG tablet Take 1 tablet by mouth once daily 90 tablet 0  . lancets (MICROLET LANCET) as directed. Check CBG's once daily. Dx: E11.40 50 each 11  . lisinopriL (ZESTRIL) 40 MG tablet Take 1 tablet by mouth once daily 90 tablet 0  . loratadine (CLARITIN) 10 mg tablet  Take 10 mg by mouth once daily as needed       . metFORMIN (GLUCOPHAGE) 500 MG tablet Take 1 tablet (500 mg total) by mouth daily with breakfast 90 tablet 1  . sodium chloride 0.9% flush injection 5 mLs by Intracatheter route as directed for Line Care 30 each 2  . tadalafiL (CIALIS) 10 MG tablet Take 1 tablet (10 mg total) by mouth once daily as needed for Erectile Dysfunction for up to 30 days Take dose 30-45 min prior to anticipated sexual activity. 10 tablet 0   No facility-administered encounter medications on file as of 07/16/2018.        ALLERGIES:   Patient has no known allergies.   SOCIAL HISTORY:  Social History          Socioeconomic History  . Marital status: Widowed    Spouse name: Not on file  . Number of children: Not on file  . Years of education: Not on file  . Highest education level: Not on file  Occupational History  . Not on file  Social Needs  . Financial resource strain: Not on file  . Food insecurity:    Worry: Not on file    Inability: Not on file  . Transportation needs:    Medical: Not on file    Non-medical:  Not on file  Tobacco Use  . Smoking status: Former Smoker    Packs/day: 1.00    Years: 30.00    Pack years: 30.00    Types: Cigarettes, Cigars    Last attempt to quit: 02/05/1993    Years since quitting: 25.4  . Smokeless tobacco: Never Used  Substance and Sexual Activity  . Alcohol use: No    Alcohol/week: 0.0 standard drinks  . Drug use: No  . Sexual activity: Defer  Other Topics Concern  . Not on file  Social History Narrative   Marital Status- Widowed   Lives in [place]   Employment- Retired   Exercise hx- [place] multiple yards   Religious Affiliation- [employer]      FAMILY HISTORY:       Family History  Problem Relation Age of Onset  . No Known Problems Mother   . Diabetes type II Father   . Kidney disease Father   . Heart disease Father   . Colon polyps Father   . No Known Problems  Sister   . No Known Problems Sister      PHYSICAL EXAM:     Vitals:   07/16/18 1539  BP: (!) 87/55  Pulse: 107  .  Ht:165.1 cm (5\' 5" ) Wt:90.7 kg (200 lb) PQD:IYME surface area is 2.04 meters squared. Body mass index is 33.28 kg/m.Marland Kitchen   GENERAL: Alert, active, oriented x3  ABDOMEN: Soft and depressible, nontender with no palpable mass, no hepatomegaly.  Purulent drainage from epigastric wound.  Trace serous drainage from umbilical wound.  EXTREMITIES: Well-developed well-nourished symmetrical with no dependent edema.  NEUROLOGICAL: Awake alert oriented, facial expression symmetrical, moving all extremities.      IMPRESSION:     Dehydration [E86.0]  Wound infection  Patient with wound infection from necrotizing cholecystitis.  This most likely due to breakage of bag upon extraction of the gallbladder to the wounds.  Patient did not respond to oral antibiotic therapy at home and is not having adequate amount of fluids.  Discussed in the evaluation.  I recommended the patient to be admitted to the hospital for IV hydration and IV antibiotic and if he feels better and tolerating more diet.  I will give local care to the wounds to.  Family present agreed with admission           PLAN:  1.  Admit to the hospital for IV antibiotics and IV fluids for hydration. 2.  Upon admission will perform new labs 3.  We will keep patient until fully hydrated and wounds improving  Patient verbalized understanding, all questions were answered, and were agreeable with the plan outlined above.   [name], MD  Electronically signed by [name], MD

## 2018-07-17 DIAGNOSIS — Z7984 Long term (current) use of oral hypoglycemic drugs: Secondary | ICD-10-CM | POA: Diagnosis not present

## 2018-07-17 DIAGNOSIS — L03316 Cellulitis of umbilicus: Secondary | ICD-10-CM | POA: Diagnosis present

## 2018-07-17 DIAGNOSIS — E1140 Type 2 diabetes mellitus with diabetic neuropathy, unspecified: Secondary | ICD-10-CM | POA: Diagnosis present

## 2018-07-17 DIAGNOSIS — Z8719 Personal history of other diseases of the digestive system: Secondary | ICD-10-CM | POA: Diagnosis not present

## 2018-07-17 DIAGNOSIS — Z87891 Personal history of nicotine dependence: Secondary | ICD-10-CM | POA: Diagnosis not present

## 2018-07-17 DIAGNOSIS — Z9049 Acquired absence of other specified parts of digestive tract: Secondary | ICD-10-CM | POA: Diagnosis not present

## 2018-07-17 DIAGNOSIS — E785 Hyperlipidemia, unspecified: Secondary | ICD-10-CM | POA: Diagnosis present

## 2018-07-17 DIAGNOSIS — T8141XA Infection following a procedure, superficial incisional surgical site, initial encounter: Secondary | ICD-10-CM | POA: Diagnosis present

## 2018-07-17 DIAGNOSIS — N179 Acute kidney failure, unspecified: Secondary | ICD-10-CM | POA: Diagnosis not present

## 2018-07-17 DIAGNOSIS — K651 Peritoneal abscess: Secondary | ICD-10-CM | POA: Diagnosis present

## 2018-07-17 DIAGNOSIS — Z20828 Contact with and (suspected) exposure to other viral communicable diseases: Secondary | ICD-10-CM | POA: Diagnosis present

## 2018-07-17 DIAGNOSIS — Z7982 Long term (current) use of aspirin: Secondary | ICD-10-CM | POA: Diagnosis not present

## 2018-07-17 DIAGNOSIS — E860 Dehydration: Secondary | ICD-10-CM | POA: Diagnosis present

## 2018-07-17 DIAGNOSIS — Y836 Removal of other organ (partial) (total) as the cause of abnormal reaction of the patient, or of later complication, without mention of misadventure at the time of the procedure: Secondary | ICD-10-CM | POA: Diagnosis present

## 2018-07-17 DIAGNOSIS — T8149XA Infection following a procedure, other surgical site, initial encounter: Secondary | ICD-10-CM | POA: Diagnosis present

## 2018-07-17 DIAGNOSIS — Z79899 Other long term (current) drug therapy: Secondary | ICD-10-CM | POA: Diagnosis not present

## 2018-07-17 DIAGNOSIS — Z9081 Acquired absence of spleen: Secondary | ICD-10-CM | POA: Diagnosis not present

## 2018-07-17 DIAGNOSIS — E876 Hypokalemia: Secondary | ICD-10-CM | POA: Diagnosis not present

## 2018-07-17 LAB — POTASSIUM: Potassium: 3.6 mmol/L (ref 3.5–5.1)

## 2018-07-17 LAB — COMPREHENSIVE METABOLIC PANEL
ALT: 14 U/L (ref 0–44)
AST: 21 U/L (ref 15–41)
Albumin: 2.5 g/dL — ABNORMAL LOW (ref 3.5–5.0)
Alkaline Phosphatase: 65 U/L (ref 38–126)
Anion gap: 12 (ref 5–15)
BUN: 40 mg/dL — ABNORMAL HIGH (ref 8–23)
CO2: 25 mmol/L (ref 22–32)
Calcium: 8.3 mg/dL — ABNORMAL LOW (ref 8.9–10.3)
Chloride: 97 mmol/L — ABNORMAL LOW (ref 98–111)
Creatinine, Ser: 1.48 mg/dL — ABNORMAL HIGH (ref 0.61–1.24)
GFR calc Af Amer: 54 mL/min — ABNORMAL LOW (ref >60)
GFR calc non Af Amer: 47 mL/min — ABNORMAL LOW (ref >60)
Glucose, Bld: 149 mg/dL — ABNORMAL HIGH (ref 70–99)
Potassium: 2.6 mmol/L — CL (ref 3.5–5.1)
Sodium: 134 mmol/L — ABNORMAL LOW (ref 135–145)
Total Bilirubin: 0.9 mg/dL (ref 0.3–1.2)
Total Protein: 6.8 g/dL (ref 6.5–8.1)

## 2018-07-17 LAB — GLUCOSE, CAPILLARY
Glucose-Capillary: 172 mg/dL — ABNORMAL HIGH (ref 70–99)
Glucose-Capillary: 151 mg/dL — ABNORMAL HIGH (ref 70–99)
Glucose-Capillary: 149 mg/dL — ABNORMAL HIGH (ref 70–99)
Glucose-Capillary: 173 mg/dL — ABNORMAL HIGH (ref 70–99)

## 2018-07-17 LAB — CBC
HCT: 36 % — ABNORMAL LOW (ref 39.0–52.0)
Hemoglobin: 12 g/dL — ABNORMAL LOW (ref 13.0–17.0)
MCH: 28 pg (ref 26.0–34.0)
MCHC: 33.3 g/dL (ref 30.0–36.0)
MCV: 83.9 fL (ref 80.0–100.0)
Platelets: 739 10*3/uL — ABNORMAL HIGH (ref 150–400)
RBC: 4.29 MIL/uL (ref 4.22–5.81)
RDW: 13.1 % (ref 11.5–15.5)
WBC: 26 10*3/uL — ABNORMAL HIGH (ref 4.0–10.5)
nRBC: 0 % (ref 0.0–0.2)

## 2018-07-17 LAB — MAGNESIUM: Magnesium: 1.9 mg/dL (ref 1.7–2.4)

## 2018-07-17 MED ORDER — POTASSIUM CHLORIDE 20 MEQ PO PACK
40.0000 meq | PACK | Freq: Once | ORAL | Status: AC
  Administered 2018-07-17: 40 meq via ORAL
  Filled 2018-07-17: qty 2

## 2018-07-17 MED ORDER — SODIUM CHLORIDE 0.9 % IV SOLN
INTRAVENOUS | Status: DC | PRN
  Administered 2018-07-17 – 2018-07-19 (×4): 250 mL via INTRAVENOUS
  Administered 2018-07-20 – 2018-07-21 (×2): 1000 mL via INTRAVENOUS

## 2018-07-17 MED ORDER — POTASSIUM CHLORIDE 10 MEQ/100ML IV SOLN
10.0000 meq | INTRAVENOUS | Status: AC
  Administered 2018-07-17 (×4): 10 meq via INTRAVENOUS
  Filled 2018-07-17 (×5): qty 100

## 2018-07-17 NOTE — Consult Note (Signed)
PHARMACY CONSULT NOTE - FOLLOW UP  Pharmacy Consult for Electrolyte Monitoring and Replacement   Recent Labs: Potassium (mmol/L)  Date Value  07/17/2018 3.6   Magnesium (mg/dL)  Date Value  07/17/2018 1.9   Calcium (mg/dL)  Date Value  07/17/2018 8.3 (L)   Albumin (g/dL)  Date Value  07/17/2018 2.5 (L)   Sodium (mmol/L)  Date Value  07/17/2018 134 (L)    Assessment: Pharmacy consulted for electrolyte monitoring and replacement in 73 yo male admitted for IV antibiotics following laparoscopic cholecystectomy due to necrotizing cholecystitis. Patient found to have hypokalemia this morning, with a K+ level of 2.6.  He received  KCL 68mEq PO x 1 dose and KCL 73mEq IV x 4 doses.  Goal of Therapy:  Electrolytes WNL   Plan:  Potassium has been replaced and is now wnl  Pharmacy will continue to follow and replace electrolytes as needed.   Dallie Piles, PharmD Clinical Pharmacist 07/17/2018 3:52 PM

## 2018-07-17 NOTE — Progress Notes (Signed)
[place] Hospital Day(s): 0.   Post op day(s):  Marland Kitchen   Interval History: Patient seen and examined, no acute events or new complaints overnight. Patient reports feeling better today.  He denies nausea or vomiting.  He denies fever or chills.  Vital signs in last 24 hours: [min-max] current  Temp:  [98.5 F (36.9 C)-98.7 F (37.1 C)] 98.5 F (36.9 C) (06/11 0431) Pulse Rate:  [67-84] 77 (06/11 0431) Resp:  [16-20] 16 (06/11 0431) BP: (105-120)/(51-59) 105/53 (06/11 0431) SpO2:  [96 %-97 %] 97 % (06/11 0431) Weight:  [90.4 kg] 90.4 kg (06/10 1807)     Height: 5\' 8"  (172.7 cm) Weight: 90.4 kg BMI (Calculated): 30.31    Physical Exam:  Constitutional: alert, cooperative and no distress  Respiratory: breathing non-labored at rest  Cardiovascular: regular rate and sinus rhythm  Gastrointestinal: soft, non-tender, and non-distended.  Epigastric and umbilical wound partially open.  Epigastric wound with purulent drainage.  Umbilical wound with cellulitis  Labs:  CBC Latest Ref Rng & Units 07/17/2018 05/20/2018 05/19/2018  WBC 4.0 - 10.5 K/uL 26.0(H) 18.5(H) 19.4(H)  Hemoglobin 13.0 - 17.0 g/dL 12.0(L) 12.2(L) 13.2  Hematocrit 39.0 - 52.0 % 36.0(L) 35.9(L) 39.5  Platelets 150 - 400 K/uL 739(H) 501(H) 514(H)   CMP Latest Ref Rng & Units 07/17/2018 05/20/2018 05/19/2018  Glucose 70 - 99 mg/dL 149(H) 163(H) 206(H)  BUN 8 - 23 mg/dL 40(H) 22 35(H)  Creatinine 0.61 - 1.24 mg/dL 1.48(H) 0.89 1.13  Sodium 135 - 145 mmol/L 134(L) 139 140  Potassium 3.5 - 5.1 mmol/L 2.6(LL) 3.4(L) 3.0(L)  Chloride 98 - 111 mmol/L 97(L) 106 107  CO2 22 - 32 mmol/L 25 25 26   Calcium 8.9 - 10.3 mg/dL 8.3(L) 8.1(L) 7.9(L)  Total Protein 6.5 - 8.1 g/dL 6.8 6.1(L) 6.5  Total Bilirubin 0.3 - 1.2 mg/dL 0.9 1.3(H) 1.4(H)  Alkaline Phos 38 - 126 U/L 65 97 116  AST 15 - 41 U/L 21 125(H) 117(H)  ALT 0 - 44 U/L 14 111(H) 113(H)    Imaging studies: No new pertinent imaging studies   Assessment/Plan:   73 y.o. male s/p laparoscopic cholecystectomy on 07/07/2018.  He developed infection that was initially treated at home with oral antibiotics.  He does not respond well to oral antibiotic and became dehydrated.  He was admitted yesterday with dehydration.  Today's labs shows acute kidney injury with an increase of creatinine to 1.48 from 0.89.  He does report feeling better.  He also have hypokalemia that is part of his dehydration.  Electrolyte being replaced by pharmacy.  We will continue with IV antibiotic therapy.  I did another drainage of the wound with significant purulent secretion.  Patient needs to continue IV antibiotic therapy.  Patient encouraged to ambulate.  Patient on DVT prophylaxis.  Arnold Long, MD

## 2018-07-17 NOTE — Plan of Care (Addendum)
Patient doing well.  Tolerating diet.  Dressings to the abdomen were changed this morning by the MD.  Patient received K+ replacement.  Tolerating diet well.  No significant changes.  Patient stated he did not want me to contact his family for an update he said he would do it.

## 2018-07-17 NOTE — Consult Note (Signed)
PHARMACY CONSULT NOTE - FOLLOW UP  Pharmacy Consult for Electrolyte Monitoring and Replacement   Recent Labs: Potassium (mmol/L)  Date Value  07/17/2018 2.6 (LL)   Magnesium (mg/dL)  Date Value  07/17/2018 1.9   Calcium (mg/dL)  Date Value  07/17/2018 8.3 (L)   Albumin (g/dL)  Date Value  07/17/2018 2.5 (L)   Sodium (mmol/L)  Date Value  07/17/2018 134 (L)    Assessment: Pharmacy consulted for electrolyte monitoring and replacement in 73 yo male admitted for IV antibiotics following laparoscopic cholecystectomy due to necrotizing cholecystitis. Patient found to have hypokalemia this morning, with a K+ level of 2.6.    Goal of Therapy:  Electrolytes WNL   Plan:  6/11 AM: K: 2.6, Mg: 1.9. Will replace with KCL 76mEq PO x 1 dose and KCL 88mEq IV x 4 doses. Will recheck K+ level this afternoon @ 1400.  Pharmacy will continue to follow and replace electrolytes as needed.   Pernell Dupre, PharmD, BCPS Clinical Pharmacist 07/17/2018 5:08 AM

## 2018-07-18 ENCOUNTER — Inpatient Hospital Stay: Payer: Medicare Other

## 2018-07-18 ENCOUNTER — Encounter: Payer: Self-pay | Admitting: Radiology

## 2018-07-18 LAB — GLUCOSE, CAPILLARY
Glucose-Capillary: 156 mg/dL — ABNORMAL HIGH (ref 70–99)
Glucose-Capillary: 124 mg/dL — ABNORMAL HIGH (ref 70–99)
Glucose-Capillary: 135 mg/dL — ABNORMAL HIGH (ref 70–99)
Glucose-Capillary: 183 mg/dL — ABNORMAL HIGH (ref 70–99)

## 2018-07-18 LAB — BASIC METABOLIC PANEL
Anion gap: 10 (ref 5–15)
BUN: 20 mg/dL (ref 8–23)
CO2: 21 mmol/L — ABNORMAL LOW (ref 22–32)
Calcium: 8.1 mg/dL — ABNORMAL LOW (ref 8.9–10.3)
Chloride: 103 mmol/L (ref 98–111)
Creatinine, Ser: 1.01 mg/dL (ref 0.61–1.24)
GFR calc Af Amer: >60 mL/min (ref >60)
GFR calc non Af Amer: >60 mL/min (ref >60)
Glucose, Bld: 159 mg/dL — ABNORMAL HIGH (ref 70–99)
Potassium: 3.2 mmol/L — ABNORMAL LOW (ref 3.5–5.1)
Sodium: 134 mmol/L — ABNORMAL LOW (ref 135–145)

## 2018-07-18 LAB — CBC
HCT: 35.2 % — ABNORMAL LOW (ref 39.0–52.0)
Hemoglobin: 11.2 g/dL — ABNORMAL LOW (ref 13.0–17.0)
MCH: 28.1 pg (ref 26.0–34.0)
MCHC: 31.8 g/dL (ref 30.0–36.0)
MCV: 88.2 fL (ref 80.0–100.0)
Platelets: 479 10*3/uL — ABNORMAL HIGH (ref 150–400)
RBC: 3.99 MIL/uL — ABNORMAL LOW (ref 4.22–5.81)
RDW: 13.3 % (ref 11.5–15.5)
WBC: 25.9 10*3/uL — ABNORMAL HIGH (ref 4.0–10.5)
nRBC: 0 % (ref 0.0–0.2)

## 2018-07-18 LAB — MAGNESIUM: Magnesium: 1.8 mg/dL (ref 1.7–2.4)

## 2018-07-18 MED ORDER — IOHEXOL 240 MG/ML SOLN
25.0000 mL | INTRAMUSCULAR | Status: AC
  Administered 2018-07-18 (×2): 25 mL via INTRAVENOUS

## 2018-07-18 MED ORDER — MIDAZOLAM HCL 2 MG/2ML IJ SOLN
INTRAMUSCULAR | Status: AC
  Filled 2018-07-18: qty 2

## 2018-07-18 MED ORDER — FENTANYL CITRATE (PF) 100 MCG/2ML IJ SOLN
INTRAMUSCULAR | Status: AC | PRN
  Administered 2018-07-18 (×2): 50 ug via INTRAVENOUS

## 2018-07-18 MED ORDER — FENTANYL CITRATE (PF) 100 MCG/2ML IJ SOLN
INTRAMUSCULAR | Status: AC
  Administered 2018-07-18: 17:00:00
  Filled 2018-07-18: qty 4

## 2018-07-18 MED ORDER — POTASSIUM CHLORIDE CRYS ER 20 MEQ PO TBCR
40.0000 meq | EXTENDED_RELEASE_TABLET | ORAL | Status: AC
  Administered 2018-07-18: 40 meq via ORAL
  Filled 2018-07-18: qty 2

## 2018-07-18 MED ORDER — MIDAZOLAM HCL 2 MG/2ML IJ SOLN
INTRAMUSCULAR | Status: AC | PRN
  Administered 2018-07-18 (×2): 1 mg via INTRAVENOUS

## 2018-07-18 MED ORDER — IOHEXOL 300 MG/ML  SOLN
100.0000 mL | Freq: Once | INTRAMUSCULAR | Status: AC | PRN
  Administered 2018-07-18: 100 mL via INTRAVENOUS

## 2018-07-18 MED ORDER — SODIUM CHLORIDE 0.9% FLUSH
5.0000 mL | Freq: Three times a day (TID) | INTRAVENOUS | Status: DC
  Administered 2018-07-18 – 2018-07-21 (×8): 5 mL

## 2018-07-18 NOTE — Progress Notes (Signed)
Patient post abscess drain placement per Dr McCullough,to.lerated well, vitals stable, received Versed 2mg  iv/Fentanyl 193mcg iv for procedure. Report given to care nurse with plan reviewed. 127ml purulent material removed from abdomen from abscess during procedrue.

## 2018-07-18 NOTE — Consult Note (Signed)
PHARMACY CONSULT NOTE - FOLLOW UP  Pharmacy Consult for Electrolyte Monitoring and Replacement   Recent Labs: Potassium (mmol/L)  Date Value  07/18/2018 3.2 (L)   Magnesium (mg/dL)  Date Value  07/18/2018 1.8   Calcium (mg/dL)  Date Value  07/18/2018 8.1 (L)   Albumin (g/dL)  Date Value  07/17/2018 2.5 (L)   Sodium (mmol/L)  Date Value  07/18/2018 134 (L)    Assessment: Pharmacy consulted for electrolyte monitoring and replacement in 73 yo male admitted for IV antibiotics following laparoscopic cholecystectomy due to necrotizing cholecystitis.   6/11: Patient found to have hypokalemia this morning, with a K+ level of 2.6.  He received  KCL 32mEq PO x 1 dose and KCL 7mEq IV x 4 doses.  6/12: K 3.2 Mg 1.8  Goal of Therapy:  Electrolytes WNL   Plan:  Will order KCl 40 mEq PO x 2 doses and recheck levels with AM labs.  Pharmacy will continue to follow and replace electrolytes as needed.   Pearla Dubonnet, PharmD Clinical Pharmacist 07/18/2018 9:05 AM

## 2018-07-18 NOTE — TOC Initial Note (Signed)
Transition of Care [facility] - Initial/Assessment Note    Patient Details  Name: Brent [name]. MRN: [id number] Date of Birth: 15-Feb-1945  Transition of Care [facility]) CM/SW Contact:    Beverly Sessions, RN Phone Number: 07/18/2018, 2:11 PM  Clinical Narrative:                 Patient s/p laparoscopic cholecystectomy on 07/07/2018 with wound infection  Patient met with patient at bedside.  He was resting as asked that I call his daughter Venida Jarvis.   Per Sherri Patient lives at home with his grandson.  Sherri state that both her and patient's fiance stop by to check on him daily.  PCP [place].  Pharmacy [facility]  Daughter has been providing transportation to appointments  Patient recently closes with [facility] and would like to use them again if indicated.  Per MD RN would be indicated at discharge  Heads up referral made to [facility] with [place]    Expected Discharge Plan: [place] Barriers to Discharge: Continued Medical Work up   Patient Goals and CMS Choice   CMS Medicare.gov Compare Post Acute Care list provided to:: Patient Represenative (must comment)(Daughter) Choice offered to / list presented to : Adult Children  Expected Discharge Plan and Services Expected Discharge Plan: [place]   Discharge Planning Services: CM Consult Post Acute Care Choice: [place] arrangements for the past 2 months: [place]: RN [place] Agency: [place] ([place]) Date HH Agency Contacted: 07/18/18 Time HH Agency Contacted: 33 Representative spoke with at [place]: [place] Arrangements/Services Living arrangements for the past 2 months: [place] Lives with:: Other (Comment)(grandson) Patient language and need for interpreter reviewed:: Yes        Need for Family Participation in Patient Care: Yes (Comment) Care giver support  system in place?: Yes (comment)   Criminal Activity/Legal Involvement Pertinent to Current Situation/Hospitalization: No - Comment as needed  Activities of Daily Living Home Assistive Devices/Equipment: Hearing aid ADL Screening (condition at time of admission) Patient's cognitive ability adequate to safely complete daily activities?: Yes Is the patient deaf or have difficulty hearing?: No Does the patient have difficulty seeing, even when wearing glasses/contacts?: No Does the patient have difficulty concentrating, remembering, or making decisions?: No Patient able to express need for assistance with ADLs?: Yes Does the patient have difficulty dressing or bathing?: No Independently performs ADLs?: Yes (appropriate for developmental age) Does the patient have difficulty walking or climbing stairs?: No Weakness of Legs: None Weakness of Arms/Hands: None  Permission Sought/Granted                  Emotional Assessment Appearance:: Appears stated age     Orientation: : Oriented to Self, Oriented to Place, Oriented to  Time, Oriented to Situation Alcohol / Substance Use: Not Applicable Psych Involvement: No (comment)  Admission diagnosis:  infected wound Patient Active Problem List   Diagnosis Date Noted  . Dehydration 07/16/2018  . Elevated liver enzymes   . Abnormal CT of the abdomen   . Duodenal ulceration   . Choledocholithiasis 05/15/2018  . Psychophysiological insomnia 05/06/2018  . Other male erectile dysfunction 10/08/2017  . History of leukocytosis 03/05/2016  . Essential hypertension 10/15/2014  . Non morbid obesity  due to excess calories 06/26/2013  . Pure hypercholesterolemia 06/26/2013  . Type 2 diabetes mellitus with diabetic neuropathy, without long-term current use of insulin ([place]) 06/26/2013   PCP:  [name], MD Pharmacy:   [facility] [address] [place] [address] [place] [address] Phone: [phone]  Fax: [phone]  [place], [place] [address] Phone: [phone] Fax: [phone]     Social Determinants of Health (SDOH) Interventions    Readmission Risk Interventions No flowsheet data found.

## 2018-07-18 NOTE — Procedures (Signed)
Interventional Radiology Procedure Note  Procedure: 60F drain placed into gallbladder fossa.  ASpiration yields 180 mL thick, foul smelling purulent fluid.  Cx sent.  Complications: None  Estimated Blood Loss: None  Recommendations: - Drain to JP - Flush Q shift - Cx pending  Signed,  Criselda Peaches, MD

## 2018-07-18 NOTE — Progress Notes (Addendum)
[place] Hospital Day(s): 1.   Post op day(s):  Marland Kitchen   Interval History: Patient seen and examined, no acute events or new complaints overnight. Patient reports feeling okay today.  He denies any significant abdominal pain.  He denies nausea or vomiting.  He denies fever.  His highest temp was 100.3 F.  She is white blood cell count did not have the expected decrease after adequate antibiotic therapy.  He is tolerating regular diet.  Vital signs in last 24 hours: [min-max] current  Temp:  [99 F (37.2 C)-100.3 F (37.9 C)] 100.3 F (37.9 C) (06/12 0622) Pulse Rate:  [72-87] 74 (06/12 0622) Resp:  [16-18] 18 (06/12 0622) BP: (107-124)/(54-61) 119/54 (06/12 0622) SpO2:  [95 %-96 %] 96 % (06/12 0622)     Height: 5\' 8"  (172.7 cm) Weight: 90.4 kg BMI (Calculated): 30.31   Physical Exam:  Constitutional: alert, cooperative and no distress  Respiratory: breathing non-labored at rest  Cardiovascular: regular rate and sinus rhythm  Gastrointestinal: soft, non-tender, and non-distended.  Partially open umbilical and epigastric wound.  The umbilical wound is getting drier.  The epigastric wound still with significant purulent drainage.  Labs:  CBC Latest Ref Rng & Units 07/18/2018 07/17/2018 05/20/2018  WBC 4.0 - 10.5 K/uL 25.9(H) 26.0(H) 18.5(H)  Hemoglobin 13.0 - 17.0 g/dL 11.2(L) 12.0(L) 12.2(L)  Hematocrit 39.0 - 52.0 % 35.2(L) 36.0(L) 35.9(L)  Platelets 150 - 400 K/uL 479(H) 739(H) 501(H)   CMP Latest Ref Rng & Units 07/18/2018 07/17/2018 07/17/2018  Glucose 70 - 99 mg/dL 159(H) - 149(H)  BUN 8 - 23 mg/dL 20 - 40(H)  Creatinine 0.61 - 1.24 mg/dL 1.01 - 1.48(H)  Sodium 135 - 145 mmol/L 134(L) - 134(L)  Potassium 3.5 - 5.1 mmol/L 3.2(L) 3.6 2.6(LL)  Chloride 98 - 111 mmol/L 103 - 97(L)  CO2 22 - 32 mmol/L 21(L) - 25  Calcium 8.9 - 10.3 mg/dL 8.1(L) - 8.3(L)  Total Protein 6.5 - 8.1 g/dL - - 6.8  Total Bilirubin 0.3 - 1.2 mg/dL - - 0.9  Alkaline Phos 38 - 126 U/L - - 65   AST 15 - 41 U/L - - 21  ALT 0 - 44 U/L - - 14    Imaging studies: No new pertinent imaging studies   Assessment/Plan:  73 y.o. male s/p laparoscopic cholecystectomy on 07/07/2018 with wound infection.  Patient with persistent white blood cell elevated.  Will obtain CT scan for further assessment and rule out intra-abdominal abscess.  There was adequate decrease of creatinine.  Acute renal failure resolved.  We will continue with IV hydration.  We will continue with IV antibiotic therapy.  Electrolyte within normal limits.  We will follow-up CT scan results for further management.  Patient DVT prophylaxis.  Encouraged to ambulate.  Arnold Long, MD  Addendum:   CT scan of the abdomen shows a large abscess on the liver bed.  I will consult IR for percutaneous drainage.  I discussed this finding with the patient and I updated the daughter Venida Jarvis and notified about the results.  Patient will stay until infection can be controlled.  We will continue with IV antibiotic therapy and will wait for IR for percutaneous drainage.

## 2018-07-19 LAB — BASIC METABOLIC PANEL
Anion gap: 11 (ref 5–15)
BUN: 12 mg/dL (ref 8–23)
CO2: 22 mmol/L (ref 22–32)
Calcium: 8.3 mg/dL — ABNORMAL LOW (ref 8.9–10.3)
Chloride: 103 mmol/L (ref 98–111)
Creatinine, Ser: 1.07 mg/dL (ref 0.61–1.24)
GFR calc Af Amer: >60 mL/min (ref >60)
GFR calc non Af Amer: >60 mL/min (ref >60)
Glucose, Bld: 173 mg/dL — ABNORMAL HIGH (ref 70–99)
Potassium: 4 mmol/L (ref 3.5–5.1)
Sodium: 136 mmol/L (ref 135–145)

## 2018-07-19 LAB — CBC
HCT: 37.2 % — ABNORMAL LOW (ref 39.0–52.0)
Hemoglobin: 12.2 g/dL — ABNORMAL LOW (ref 13.0–17.0)
MCH: 27.8 pg (ref 26.0–34.0)
MCHC: 32.8 g/dL (ref 30.0–36.0)
MCV: 84.7 fL (ref 80.0–100.0)
Platelets: 695 10*3/uL — ABNORMAL HIGH (ref 150–400)
RBC: 4.39 MIL/uL (ref 4.22–5.81)
RDW: 13.1 % (ref 11.5–15.5)
WBC: 30.6 10*3/uL — ABNORMAL HIGH (ref 4.0–10.5)
nRBC: 0 % (ref 0.0–0.2)

## 2018-07-19 LAB — GLUCOSE, CAPILLARY
Glucose-Capillary: 157 mg/dL — ABNORMAL HIGH (ref 70–99)
Glucose-Capillary: 200 mg/dL — ABNORMAL HIGH (ref 70–99)
Glucose-Capillary: 147 mg/dL — ABNORMAL HIGH (ref 70–99)
Glucose-Capillary: 174 mg/dL — ABNORMAL HIGH (ref 70–99)

## 2018-07-19 NOTE — Consult Note (Signed)
PHARMACY CONSULT NOTE - FOLLOW UP  Pharmacy Consult for Electrolyte Monitoring and Replacement   Recent Labs: Potassium (mmol/L)  Date Value  07/19/2018 4.0   Magnesium (mg/dL)  Date Value  07/18/2018 1.8   Calcium (mg/dL)  Date Value  07/19/2018 8.3 (L)   Albumin (g/dL)  Date Value  07/17/2018 2.5 (L)   Sodium (mmol/L)  Date Value  07/19/2018 136    Assessment: Pharmacy consulted for electrolyte monitoring and replacement in 73 yo male admitted for IV antibiotics following laparoscopic cholecystectomy due to necrotizing cholecystitis.   Goal of Therapy:  Electrolytes WNL   Plan:  6/13 electrolytes WNL this morning.   No replacement warranted at this time.   Pharmacy will continue to follow and replace electrolytes as needed.   Pernell Dupre, PharmD, BCPS Clinical Pharmacist 07/19/2018 6:33 AM

## 2018-07-19 NOTE — Progress Notes (Signed)
[place] Hospital Day(s): 2.   Post op day(s):  Marland Kitchen   Interval History: Patient seen and examined, no acute events or new complaints overnight. Patient reports feeling okay today.  He reports that he tolerated diet.  He denies any problem after procedure was done.  Denies fever chills.  Tolerating diet passing gas.  Pain under control.  Vital signs in last 24 hours: [min-max] current  Temp:  [99 F (37.2 C)-100.2 F (37.9 C)] 99.5 F (37.5 C) (06/13 0504) Pulse Rate:  [69-100] 88 (06/13 0504) Resp:  [14-27] 16 (06/13 0504) BP: (98-167)/(47-98) 133/63 (06/13 0504) SpO2:  [94 %-99 %] 94 % (06/13 0504)     Height: 5\' 8"  (172.7 cm) Weight: 90.4 kg BMI (Calculated): 30.31   Drain: 80 ml since procedure. (pus)  Physical Exam:  Constitutional: alert, cooperative and no distress  Respiratory: breathing non-labored at rest  Cardiovascular: regular rate and sinus rhythm  Gastrointestinal: soft, non-tender, and non-distended.  Epigastric and umbilical wound partially open.  Today they are dry without significant purulent drainage.  There is a right upper quadrant drain with purulent drainage.  Labs:  CBC Latest Ref Rng & Units 07/18/2018 07/17/2018 05/20/2018  WBC 4.0 - 10.5 K/uL 25.9(H) 26.0(H) 18.5(H)  Hemoglobin 13.0 - 17.0 g/dL 11.2(L) 12.0(L) 12.2(L)  Hematocrit 39.0 - 52.0 % 35.2(L) 36.0(L) 35.9(L)  Platelets 150 - 400 K/uL 479(H) 739(H) 501(H)   CMP Latest Ref Rng & Units 07/19/2018 07/18/2018 07/17/2018  Glucose 70 - 99 mg/dL 173(H) 159(H) -  BUN 8 - 23 mg/dL 12 20 -  Creatinine 0.61 - 1.24 mg/dL 1.07 1.01 -  Sodium 135 - 145 mmol/L 136 134(L) -  Potassium 3.5 - 5.1 mmol/L 4.0 3.2(L) 3.6  Chloride 98 - 111 mmol/L 103 103 -  CO2 22 - 32 mmol/L 22 21(L) -  Calcium 8.9 - 10.3 mg/dL 8.3(L) 8.1(L) -  Total Protein 6.5 - 8.1 g/dL - - -  Total Bilirubin 0.3 - 1.2 mg/dL - - -  Alkaline Phos 38 - 126 U/L - - -  AST 15 - 41 U/L - - -  ALT 0 - 44 U/L - - -    Imaging  studies:   CT scan abdominal pelvis personally reviewed.  There is a 10 x 7 cm fluid collection.  I also evaluated the images of the CT percutaneous drainage and the report.  Drainage of procedure was because.  Drain in place.   Assessment/Plan:  73 y.o.males/p laparoscopic cholecystectomy on 07/07/2018 with wound infection and intra-abdominal abscess.  Patient with intra-abdominal abscess status post percutaneous drainage.  Aspiration during procedure was positive today the fluid in the bulb is positive there is no evidence of bile at this moment.  I will order a follow-up CBC today and assess white blood cell trend.  If white blood cell count decreased patient may be discharged today since he is tolerating diet, pain control and the wounds are much better.  Wound infection.  Wounds are looking much better.  Partially open.  Responding well to current antibiotic therapy.  Dehydration control resolved patient with adequate renal function and adequate electrolytes.  Patient tolerating diet and drinking plenty of fluids.  Arnold Long, MD

## 2018-07-20 LAB — BASIC METABOLIC PANEL
Anion gap: 10 (ref 5–15)
BUN: 11 mg/dL (ref 8–23)
CO2: 23 mmol/L (ref 22–32)
Calcium: 8.1 mg/dL — ABNORMAL LOW (ref 8.9–10.3)
Chloride: 104 mmol/L (ref 98–111)
Creatinine, Ser: 0.9 mg/dL (ref 0.61–1.24)
GFR calc Af Amer: >60 mL/min (ref >60)
GFR calc non Af Amer: >60 mL/min (ref >60)
Glucose, Bld: 148 mg/dL — ABNORMAL HIGH (ref 70–99)
Potassium: 3.4 mmol/L — ABNORMAL LOW (ref 3.5–5.1)
Sodium: 137 mmol/L (ref 135–145)

## 2018-07-20 LAB — CBC
HCT: 34.6 % — ABNORMAL LOW (ref 39.0–52.0)
Hemoglobin: 11.1 g/dL — ABNORMAL LOW (ref 13.0–17.0)
MCH: 28 pg (ref 26.0–34.0)
MCHC: 32.1 g/dL (ref 30.0–36.0)
MCV: 87.4 fL (ref 80.0–100.0)
Platelets: 662 10*3/uL — ABNORMAL HIGH (ref 150–400)
RBC: 3.96 MIL/uL — ABNORMAL LOW (ref 4.22–5.81)
RDW: 13.1 % (ref 11.5–15.5)
WBC: 27.7 10*3/uL — ABNORMAL HIGH (ref 4.0–10.5)
nRBC: 0 % (ref 0.0–0.2)

## 2018-07-20 LAB — HEPATIC FUNCTION PANEL
ALT: 12 U/L (ref 0–44)
AST: 17 U/L (ref 15–41)
Albumin: 2.3 g/dL — ABNORMAL LOW (ref 3.5–5.0)
Alkaline Phosphatase: 62 U/L (ref 38–126)
Bilirubin, Direct: 0.3 mg/dL — ABNORMAL HIGH (ref 0.0–0.2)
Indirect Bilirubin: 0.1 mg/dL — ABNORMAL LOW (ref 0.3–0.9)
Total Bilirubin: 0.4 mg/dL (ref 0.3–1.2)
Total Protein: 6.4 g/dL — ABNORMAL LOW (ref 6.5–8.1)

## 2018-07-20 LAB — GLUCOSE, CAPILLARY
Glucose-Capillary: 131 mg/dL — ABNORMAL HIGH (ref 70–99)
Glucose-Capillary: 184 mg/dL — ABNORMAL HIGH (ref 70–99)
Glucose-Capillary: 137 mg/dL — ABNORMAL HIGH (ref 70–99)
Glucose-Capillary: 137 mg/dL — ABNORMAL HIGH (ref 70–99)

## 2018-07-20 LAB — MAGNESIUM: Magnesium: 1.5 mg/dL — ABNORMAL LOW (ref 1.7–2.4)

## 2018-07-20 MED ORDER — POTASSIUM CHLORIDE CRYS ER 20 MEQ PO TBCR
40.0000 meq | EXTENDED_RELEASE_TABLET | ORAL | Status: AC
  Administered 2018-07-20 (×2): 40 meq via ORAL
  Filled 2018-07-20 (×2): qty 2

## 2018-07-20 MED ORDER — MAGNESIUM SULFATE 2 GM/50ML IV SOLN
2.0000 g | Freq: Once | INTRAVENOUS | Status: AC
  Administered 2018-07-20: 2 g via INTRAVENOUS
  Filled 2018-07-20: qty 50

## 2018-07-20 MED ORDER — PANTOPRAZOLE SODIUM 40 MG PO TBEC
40.0000 mg | DELAYED_RELEASE_TABLET | Freq: Every day | ORAL | Status: DC
  Administered 2018-07-20: 40 mg via ORAL
  Filled 2018-07-20: qty 1

## 2018-07-20 MED ORDER — ENSURE ENLIVE PO LIQD
237.0000 mL | Freq: Two times a day (BID) | ORAL | Status: DC
  Administered 2018-07-20 – 2018-07-21 (×3): 237 mL via ORAL

## 2018-07-20 NOTE — Consult Note (Signed)
PHARMACY CONSULT NOTE - FOLLOW UP  Pharmacy Consult for Electrolyte Monitoring and Replacement   Recent Labs: Potassium (mmol/L)  Date Value  07/20/2018 3.4 (L)   Magnesium (mg/dL)  Date Value  07/20/2018 1.5 (L)   Calcium (mg/dL)  Date Value  07/20/2018 8.1 (L)   Albumin (g/dL)  Date Value  07/20/2018 2.3 (L)   Sodium (mmol/L)  Date Value  07/20/2018 137    Assessment: Pharmacy consulted for electrolyte monitoring and replacement in 73 yo male admitted for IV antibiotics following laparoscopic cholecystectomy due to necrotizing cholecystitis. Potassium 3.4, Mg 1.5.   Goal of Therapy:  Electrolytes WNL   Plan:  Will give KCl 40 mEq x 2 PO and Mg 2g IV x 1. Recheck labs in AM.   Pharmacy will continue to follow and replace electrolytes as needed.   Oswald Hillock, PharmD, BCPS Clinical Pharmacist 07/20/2018 7:48 AM

## 2018-07-20 NOTE — Progress Notes (Signed)
[facility] Day(s): 3.   Post op day(s):  Marland Kitchen   Interval History: Patient seen and examined, no acute events or new complaints overnight. Patient reports okay.  Patient reports that he does not have much appetite but is eating most of the food.  He denies fever or chills.  He denies nausea or vomiting.  Vital signs in last 24 hours: [min-max] current  Temp:  [97.2 F (36.2 C)-99.1 F (37.3 C)] 99.1 F (37.3 C) (06/14 0324) Pulse Rate:  [68-77] 77 (06/14 0324) Resp:  [15-16] 16 (06/14 0324) BP: (103-140)/(51-72) 133/70 (06/14 0324) SpO2:  [94 %-97 %] 94 % (06/14 0324)     Height: 5\' 8"  (172.7 cm) Weight: 90.4 kg BMI (Calculated): 30.31   Drain: 20 mL in 24 hours. (Pus)  Physical Exam:  Constitutional: alert, cooperative and no distress  Respiratory: breathing non-labored at rest  Cardiovascular: regular rate and sinus rhythm  Gastrointestinal: soft, non-tender, and non-distended.  The epigastric and umbilical are partially open.  Today they are drier with minimal purulent drainage.  Erythema has significantly improved to.  Labs:  CBC Latest Ref Rng & Units 07/20/2018 07/19/2018 07/18/2018  WBC 4.0 - 10.5 K/uL 27.7(H) 30.6(H) 25.9(H)  Hemoglobin 13.0 - 17.0 g/dL 11.1(L) 12.2(L) 11.2(L)  Hematocrit 39.0 - 52.0 % 34.6(L) 37.2(L) 35.2(L)  Platelets 150 - 400 K/uL 662(H) 695(H) 479(H)   CMP Latest Ref Rng & Units 07/20/2018 07/19/2018 07/18/2018  Glucose 70 - 99 mg/dL 148(H) 173(H) 159(H)  BUN 8 - 23 mg/dL 11 12 20   Creatinine 0.61 - 1.24 mg/dL 0.90 1.07 1.01  Sodium 135 - 145 mmol/L 137 136 134(L)  Potassium 3.5 - 5.1 mmol/L 3.4(L) 4.0 3.2(L)  Chloride 98 - 111 mmol/L 104 103 103  CO2 22 - 32 mmol/L 23 22 21(L)  Calcium 8.9 - 10.3 mg/dL 8.1(L) 8.3(L) 8.1(L)  Total Protein 6.5 - 8.1 g/dL 6.4(L) - -  Total Bilirubin 0.3 - 1.2 mg/dL 0.4 - -  Alkaline Phos 38 - 126 U/L 62 - -  AST 15 - 41 U/L 17 - -  ALT 0 - 44 U/L 12 - -    Imaging studies: No new pertinent  imaging studies   Assessment/Plan:  73 y.o.males/p laparoscopic cholecystectomy on 6/1/2020with wound infection and intra-abdominal abscess that was close percutaneous drainage.  Today patient with improvement of white blood cell compared to yesterday.  Still elevated compared to his baseline.  I will continue IV antibiotics until I see him better decreasing trend.  There is no fever the last 24 hours.  Patient is tolerating diet.  The consistency of the drain still significantly purulent.  There is no sign of bile in the drain but with the amount of pus it may be difficult to appreciate bilious output.  Patient is to continue with drain in place.  I will follow white blood cell trend with labs tomorrow.  Electrolyte within normal limits.  Sherri, the daughter is aware of these findings and recommendations.  Patient on DVT prophylaxis.  Will discontinue IV fluids and keep on saline lock as patient is eating regular diet.  We will add Ensure as a supplement.  Arnold Long, MD

## 2018-07-21 LAB — BASIC METABOLIC PANEL
Anion gap: 5 (ref 5–15)
BUN: 10 mg/dL (ref 8–23)
CO2: 27 mmol/L (ref 22–32)
Calcium: 8.3 mg/dL — ABNORMAL LOW (ref 8.9–10.3)
Chloride: 106 mmol/L (ref 98–111)
Creatinine, Ser: 0.97 mg/dL (ref 0.61–1.24)
GFR calc Af Amer: >60 mL/min (ref >60)
GFR calc non Af Amer: >60 mL/min (ref >60)
Glucose, Bld: 133 mg/dL — ABNORMAL HIGH (ref 70–99)
Potassium: 3.7 mmol/L (ref 3.5–5.1)
Sodium: 138 mmol/L (ref 135–145)

## 2018-07-21 LAB — CBC
HCT: 34.2 % — ABNORMAL LOW (ref 39.0–52.0)
Hemoglobin: 11 g/dL — ABNORMAL LOW (ref 13.0–17.0)
MCH: 28.1 pg (ref 26.0–34.0)
MCHC: 32.2 g/dL (ref 30.0–36.0)
MCV: 87.2 fL (ref 80.0–100.0)
Platelets: 721 10*3/uL — ABNORMAL HIGH (ref 150–400)
RBC: 3.92 MIL/uL — ABNORMAL LOW (ref 4.22–5.81)
RDW: 13.2 % (ref 11.5–15.5)
WBC: 20.8 10*3/uL — ABNORMAL HIGH (ref 4.0–10.5)
nRBC: 0 % (ref 0.0–0.2)

## 2018-07-21 LAB — AEROBIC/ANAEROBIC CULTURE W GRAM STAIN (SURGICAL/DEEP WOUND)

## 2018-07-21 LAB — GLUCOSE, CAPILLARY
Glucose-Capillary: 134 mg/dL — ABNORMAL HIGH (ref 70–99)
Glucose-Capillary: 190 mg/dL — ABNORMAL HIGH (ref 70–99)

## 2018-07-21 LAB — MAGNESIUM: Magnesium: 1.9 mg/dL (ref 1.7–2.4)

## 2018-07-21 MED ORDER — SULFAMETHOXAZOLE-TRIMETHOPRIM 800-160 MG PO TABS: 1.0000 | ORAL_TABLET | Freq: Two times a day (BID) | ORAL | 0 refills | Status: DC

## 2018-07-21 NOTE — Progress Notes (Signed)
PHARMACY - ANTIMICROBIAL STEWARDSHIP  Gallbladder abscess culture returned with E. Coli, E. Faecalis, and possible anaerobe.  He was sent home earlier today prior to susceptibility results on TMP/SMZ.  The E. Coli was resistant to TMP/SMZ (and it also lacks activity for E. Faecalis and anaerobes).  Called microbiology and enterococcus was susc to ciprofloxacin and levofloxacin  Plan  Discussed with Dr. Peyton Najjar and since Enterococcus was susc to quinolone. Called prescription to his [facility] for ciprofloxacin 500mg  PO BID x 7d and metronidazole 500mg  PO TID  Called patient's daughter and was able to speak to both her and patient.  Informed them new antibiotic prescriptions were called in and reason for change.    Doreene Eland, PharmD, BCPS.   Work Cell: [phone] 07/21/2018 2:15 PM

## 2018-07-21 NOTE — Discharge Instructions (Signed)
°  Diet: Resume home heart healthy regular diet.   Activity: No heavy lifting >20 pounds (children, pets, laundry, garbage) or strenuous activity until follow-up, but light activity and walking are encouraged. Do not drive or drink alcohol if taking narcotic pain medications.  Wound care: Remove dressing daily. Use the tip of a dry gauze to pack lightly the wound. May shower with soapy water and pat dry (do not rub incisions), but no baths or submerging incision underwater until follow-up. (no swimming)   Medications: Resume all home medications. For mild to moderate pain: acetaminophen (Tylenol) or ibuprofen (if no kidney disease). Combining Tylenol with alcohol can substantially increase your risk of causing liver disease. Narcotic pain medications, if prescribed, can be used for severe pain, though may cause nausea, constipation, and drowsiness. Do not combine Tylenol and Norco within a 6 hour period as Norco contains Tylenol. If you do not need the narcotic pain medication, you do not need to fill the prescription.  Take new antibiotic (Septra) as prescribed.   Call office [phone]) at any time if any questions, worsening pain, fevers/chills, bleeding, drainage from incision site, or other concerns.

## 2018-07-21 NOTE — Progress Notes (Signed)
Wynona Canes. to be D/C'd home per MD order.  Discussed prescriptions and follow up appointments with the patient. Prescriptions given to patient, medication list explained in detail. Pt verbalized understanding.  Allergies as of 07/21/2018   No Known Allergies     Medication List    STOP taking these medications   amoxicillin-clavulanate 875-125 MG tablet Commonly known as: Augmentin     TAKE these medications   aspirin EC 81 MG tablet Take 81 mg by mouth daily.   atorvastatin 40 MG tablet Commonly known as: LIPITOR Take 40 mg by mouth daily at 6 PM.   CLEAR EYES OP Place 1 drop into both eyes daily as needed (itching eyes).   hydrochlorothiazide 12.5 MG tablet Commonly known as: HYDRODIURIL Take 12.5 mg by mouth every morning.   ketoconazole 2 % cream Commonly known as: NIZORAL Apply 1 application topically daily as needed (fungus).   lisinopril 40 MG tablet Commonly known as: ZESTRIL Take 40 mg by mouth every morning.   Melatonin 10 MG Caps Take 10 mg by mouth at bedtime as needed (sleep).   metFORMIN 500 MG tablet Commonly known as: GLUCOPHAGE Take 500 mg by mouth daily with breakfast.   ROLAIDS PO Take 1 tablet by mouth as needed.   sodium chloride flush 0.9 % Soln Commonly known as: NS 5 mLs by Intracatheter route every 8 (eight) hours.   sulfamethoxazole-trimethoprim 800-160 MG tablet Commonly known as: BACTRIM DS Take 1 tablet by mouth 2 (two) times daily.   tadalafil 10 MG tablet Commonly known as: CIALIS Take 10 mg by mouth daily as needed for erectile dysfunction.   triamcinolone cream 0.5 % Commonly known as: KENALOG Apply 1 application topically 2 (two) times daily as needed (dry skin).       Vitals:   07/20/18 2118 07/21/18 0517  BP: 139/68 137/69  Pulse: 72 66  Resp: 16 16  Temp: 98.4 F (36.9 C) 97.9 F (36.6 C)  SpO2: 95% 96%    Skin clean, dry and intact without evidence of skin break down, no evidence of skin tears  noted. IV catheter discontinued intact. Site without signs and symptoms of complications. Dressing and pressure applied. Pt denies pain at this time. No complaints noted.  An After Visit Summary was printed and given to the patient. Patient escorted via [place], and D/C home via private auto.  Chuck Hint RN [facility] [employer]

## 2018-07-21 NOTE — Care Management Important Message (Signed)
Important Message  Patient Details  Name: Brent [name]. MRN: [id number] Date of Birth: Aug 06, 1945   Medicare Important Message Given:  Yes    Dannette Barbara 07/21/2018, 10:35 AM

## 2018-07-21 NOTE — Discharge Summary (Signed)
Patient ID: Brent [name]. MRN: [id number] DOB/AGE: Aug 02, 1945 73 y.o.  Admit date: 07/16/2018 Discharge date: 07/21/2018   Discharge Diagnoses:  Active Problems:   Dehydration Intra-abdominal abscess Wound infection  Procedures: CT-guided percutaneous drain placement  Hospital Course: Patient was admitted with dehydration and wound infection.  The wounds were open and local care was given.  Patient was started in IV antibiotic therapy.  Due to persistent elevation of white blood cell count a CT scan of the abdomen was done.  It shows a 10 x 7 cm abscess in the hepatic bed.  This was successfully drained percutaneously by IR.  After drainage there was also has been trending down.  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  Patient also with adequate hydration with normal renal function and adequate electrolytes.  Patient is eating significantly better and tolerating regular diet.  Wounds are not draining persistently in the morning.  Have been doing local care personally.  There has not been any fever in the last 48 hours.  Physical Exam  Constitutional: He is oriented to person, place, and time and well-developed, well-nourished, and in no distress.  Cardiovascular: Normal rate and regular rhythm.  Pulmonary/Chest: Effort normal.  Abdominal: Soft. Bowel sounds are normal. He exhibits no distension. There is no abdominal tenderness.  Neurological: He is alert and oriented to person, place, and time.  Skin: Skin is warm.  Epigastric and umbilical wound partially open with no necrotic tissue.  No purulent drainage.   Consults: Interventional radiologist  Disposition: Discharge disposition: 01-Home or Self Care       Discharge Instructions    Diet - low sodium heart healthy   Complete by: As directed    Increase activity slowly   Complete by: As directed      Allergies as of 07/21/2018   No Known Allergies     Medication List    STOP taking these medications    amoxicillin-clavulanate 875-125 MG tablet Commonly known as: Augmentin     TAKE these medications   aspirin EC 81 MG tablet Take 81 mg by mouth daily.   atorvastatin 40 MG tablet Commonly known as: LIPITOR Take 40 mg by mouth daily at 6 PM.   CLEAR EYES OP Place 1 drop into both eyes daily as needed (itching eyes).   hydrochlorothiazide 12.5 MG tablet Commonly known as: HYDRODIURIL Take 12.5 mg by mouth every morning.   ketoconazole 2 % cream Commonly known as: NIZORAL Apply 1 application topically daily as needed (fungus).   lisinopril 40 MG tablet Commonly known as: ZESTRIL Take 40 mg by mouth every morning.   Melatonin 10 MG Caps Take 10 mg by mouth at bedtime as needed (sleep).   metFORMIN 500 MG tablet Commonly known as: GLUCOPHAGE Take 500 mg by mouth daily with breakfast.   ROLAIDS PO Take 1 tablet by mouth as needed.   sodium chloride flush 0.9 % Soln Commonly known as: NS 5 mLs by Intracatheter route every 8 (eight) hours.   sulfamethoxazole-trimethoprim 800-160 MG tablet Commonly known as: BACTRIM DS Take 1 tablet by mouth 2 (two) times daily.   tadalafil 10 MG tablet Commonly known as: CIALIS Take 10 mg by mouth daily as needed for erectile dysfunction.   triamcinolone cream 0.5 % Commonly known as: KENALOG Apply 1 application topically 2 (two) times daily as needed (dry skin).      Follow-up Information    [name], MD Follow up in 3 day(s).   Specialty: General Surgery Contact  information: [place] [id number] [phone]

## 2018-07-21 NOTE — Consult Note (Signed)
PHARMACY CONSULT NOTE - FOLLOW UP  Pharmacy Consult for Electrolyte Monitoring and Replacement   Recent Labs: Potassium (mmol/L)  Date Value  07/21/2018 3.7   Magnesium (mg/dL)  Date Value  07/21/2018 1.9   Calcium (mg/dL)  Date Value  07/21/2018 8.3 (L)   Albumin (g/dL)  Date Value  07/20/2018 2.3 (L)   Sodium (mmol/L)  Date Value  07/21/2018 138    Assessment: Pharmacy consulted for electrolyte monitoring and replacement in 73 yo male admitted for IV antibiotics following laparoscopic cholecystectomy due to necrotizing cholecystitis.   Goal of Therapy:  Electrolytes WNL   Plan:   No electrolyte replacement needed for today  The patient has discharge orders in place, therefore no further labs will be ordered  Pharmacy will continue to follow and replace electrolytes as needed if the discharge is cancelled  Dallie Piles, PharmD Clinical Pharmacist 07/21/2018 7:04 AM

## 2018-07-21 NOTE — TOC Progression Note (Signed)
Transition of Care [facility]) - Progression Note    Patient Details  Name: Brent [name]. MRN: [id number] Date of Birth: February 08, 1945  Transition of [facility] CM/SW Contact  Shela Leff, [place] Phone Number: 07/21/2018, 3:14 PM  Clinical Narrative:   Patient discharged today and has resumption home health orders. CSW contacted Corene Cornea with [facility] and they will resume services.    Expected Discharge Plan: [name] Barriers to Discharge: Continued Medical Work up  Expected Discharge Plan and Services Expected Discharge Plan: [place]   Discharge Planning Services: CM Consult Post Acute Care Choice: [place] arrangements for the past 2 months: Single Family Home Expected Discharge Date: 07/21/18                         HH Arranged: RN [place] Agency: [place] ([place]) Date [place]: 07/18/18 Time [place]: 1410 Representative spoke with at [place]: [place] ([place]) Interventions    Readmission Risk Interventions No flowsheet data found.

## 2018-07-24 ENCOUNTER — Other Ambulatory Visit: Payer: Self-pay | Admitting: General Surgery

## 2018-07-24 DIAGNOSIS — K651 Peritoneal abscess: Secondary | ICD-10-CM

## 2018-08-05 ENCOUNTER — Ambulatory Visit
Admission: RE | Admit: 2018-08-05 | Discharge: 2018-08-05 | Disposition: A | Discharge status: Home / Self Care | Payer: Medicare Other | Source: Ambulatory Visit | Attending: General Surgery | Admitting: General Surgery

## 2018-08-05 ENCOUNTER — Other Ambulatory Visit: Payer: Self-pay

## 2018-08-05 DIAGNOSIS — K651 Peritoneal abscess: Secondary | ICD-10-CM | POA: Insufficient documentation

## 2018-08-05 MED ORDER — TECHNETIUM TC 99M MEBROFENIN IV KIT
4.8900 | PACK | Freq: Once | INTRAVENOUS | Status: AC | PRN
  Administered 2018-08-05: 4.89 via INTRAVENOUS

## 2018-08-05 MED ORDER — IOHEXOL 300 MG/ML  SOLN
100.0000 mL | Freq: Once | INTRAMUSCULAR | Status: AC | PRN
  Administered 2018-08-05: 100 mL via INTRAVENOUS

## 2019-03-11 DIAGNOSIS — M1990 Unspecified osteoarthritis, unspecified site: Secondary | ICD-10-CM | POA: Insufficient documentation

## 2019-06-11 ENCOUNTER — Other Ambulatory Visit: Payer: Self-pay | Admitting: General Surgery

## 2019-06-11 DIAGNOSIS — L02211 Cutaneous abscess of abdominal wall: Secondary | ICD-10-CM

## 2019-06-18 ENCOUNTER — Ambulatory Visit: Payer: Medicare Other

## 2019-06-22 ENCOUNTER — Ambulatory Visit
Admission: RE | Admit: 2019-06-22 | Discharge: 2019-06-22 | Disposition: A | Discharge status: Home / Self Care | Payer: Medicare Other | Source: Ambulatory Visit | Attending: General Surgery | Admitting: General Surgery

## 2019-06-22 ENCOUNTER — Other Ambulatory Visit: Payer: Self-pay

## 2019-06-22 DIAGNOSIS — L02211 Cutaneous abscess of abdominal wall: Secondary | ICD-10-CM | POA: Diagnosis not present

## 2019-06-22 MED ORDER — IOHEXOL 300 MG/ML  SOLN
100.0000 mL | Freq: Once | INTRAMUSCULAR | Status: AC | PRN
  Administered 2019-06-22: 100 mL via INTRAVENOUS

## 2019-06-25 ENCOUNTER — Ambulatory Visit: Payer: Self-pay | Admitting: General Surgery

## 2019-06-25 NOTE — H&P (View-Only) (Signed)
HISTORY OF PRESENT ILLNESS:    Brent [name] is a 74 y.o.male patient who comes for evaluation of bulge in the periumbilical area.  Patient s/p open cholecystectomy.  He developed it periumbilical wound infection that was treated with incision and drainage.  Patient also treated with antibiotic therapy.  Initially the infection resolved but it recur.  Since then he has been having chronic drainage.  Patient denies any significant pain in that area.  There is no pain radiation.  There is no alleviating or aggravating factor.  Patient does feel like a bulge on the periumbilical area.  Last evaluation I ordered a CT scan.  I personally evaluated the images.  There is an incisional hernia without obstruction or gangrene.      PAST MEDICAL HISTORY:      Past Medical History:  Diagnosis Date  . Chronic osteoarthritis (left knee - xray 04/2018) 03/11/2019  . 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 Hyperlipidemia    LDL 103- 12/2012  . Internal hemorrhoids 03/22/2014  . Obesity   . Tubular adenoma of colon, unspecified 03/22/2014        PAST SURGICAL HISTORY:        Past Surgical History:  Procedure Laterality Date  . CHOLECYSTECTOMY  07/07/2018   Dr Lesli Albee  . COLONOSCOPY  03/22/2014   Dr. Dorthy Cooler @ [facility] - Tubular Adenomas, rpt 23yrs per [facility]  . COLONOSCOPY  08/05/2017   Tubular Adenoma: [facility] 08/2020  . ENDOSCOPIC CARPAL TUNNEL RELEASE Left 10/12/2015   Dr.Menz  . SPLENECTOMY           MEDICATIONS:  Encounter Medications  Outpatient Encounter Medications as of 06/25/2019  Medication Sig Dispense Refill  . acetaminophen (TYLENOL) 500 MG tablet Take 2 tablets (1,000 mg total) by mouth every 8 (eight) hours 30 tablet 0  . aspirin 81 MG EC tablet Take 81 mg by mouth once daily.    Marland Kitchen atorvastatin (LIPITOR) 40 MG tablet Take 1 tablet by mouth once daily 90 tablet 1  . blood glucose diagnostic (CONTOUR NEXT STRIPS)  test strip Use as directed. Check CBG's twice weekly. Dx: E11.49 50 each 11  . hydroCHLOROthiazide (HYDRODIURIL) 12.5 MG tablet Take 1 tablet by mouth once daily 90 tablet 1  . lancets (MICROLET LANCET) as directed. Check CBG's once daily. Dx: E11.40 50 each 11  . lisinopriL (ZESTRIL) 40 MG tablet Take 1 tablet by mouth once daily 90 tablet 0  . metFORMIN (GLUCOPHAGE) 500 MG tablet Take 1 tablet by mouth once daily with breakfast 90 tablet 0  . sildenafiL (VIAGRA) 100 MG tablet TAKE 1 TABLET BY MOUTH ONCE DAILY AS NEEDED FOR ERECTILE DYSFUNCTION     No facility-administered encounter medications on file as of 06/25/2019.       ALLERGIES:   Patient has no known allergies.   SOCIAL HISTORY:  Social History          Socioeconomic History  . Marital status: Widowed    Spouse name: Not on file  . Number of children: Not on file  . Years of education: Not on file  . Highest education level: Not on file  Occupational History  . Not on file  Tobacco Use  . Smoking status: Former Smoker    Packs/day: 1.00    Years: 30.00    Pack years: 30.00    Types: Cigarettes, Cigars    Quit date: 02/05/1993    Years since  quitting: 26.4  . Smokeless tobacco: Never Used  Vaping Use  . Vaping Use: Never used  Substance and Sexual Activity  . Alcohol use: No    Alcohol/week: 0.0 standard drinks  . Drug use: No  . Sexual activity: Not Currently  Other Topics Concern  . Not on file  Social History Narrative   Marital Status- Widowed   Lives in [place]   Employment- Retired   [employer]   [place]   Social Determinants of Health      Financial Resource Strain:   . Difficulty of Paying Living Expenses:   Food Insecurity:   . Worried About Charity fundraiser in the Last Year:   . Arboriculturist in the Last Year:   Transportation Needs:   . Film/video editor (Medical):   Marland Kitchen Lack of Transportation (Non-Medical):        FAMILY HISTORY:       Family History  Problem Relation Age of Onset  . No Known Problems Mother   . Diabetes type II Father   . Kidney disease Father   . Heart disease Father   . Colon polyps Father   . No Known Problems Sister   . No Known Problems Sister      GENERAL REVIEW OF SYSTEMS:   General ROS: negative for - chills, fatigue, fever, weight gain or weight loss Allergy and Immunology ROS: negative for - hives  Hematological and Lymphatic ROS: negative for - bleeding problems or bruising, negative for palpable nodes Endocrine ROS: negative for - heat or cold intolerance, hair changes Respiratory ROS: negative for - cough, shortness of breath or wheezing Cardiovascular ROS: no chest pain or palpitations GI ROS: negative for nausea, vomiting, abdominal pain, diarrhea, constipation Musculoskeletal ROS: negative for - joint swelling or muscle pain Neurological ROS: negative for - confusion, syncope Dermatological ROS: negative for pruritus and rash  PHYSICAL EXAM:     Vitals:   06/25/19 0856  BP: 170/70  Pulse: 62  .  Ht:172.7 cm (5\' 8" ) Wt:(!) 108 kg (238 lb) [id number] surface area is 2.28 meters squared. Body mass index is 36.19 kg/m.Marland Kitchen   GENERAL: Alert, active, oriented x3  HEENT: Pupils equal reactive to light. Extraocular movements are intact. Sclera clear. Palpebral conjunctiva normal red color.Pharynx clear.  NECK: Supple with no palpable mass and no adenopathy.  LUNGS: Sound clear with no rales rhonchi or wheezes.  HEART: Regular rhythm S1 and S2 without murmur.  ABDOMEN: Soft and depressible, nontender with no palpable mass, no hepatomegaly.  Small wound opening on the supraumbilical area with chronic venous output  EXTREMITIES: Well-developed well-nourished symmetrical with no dependent edema.  NEUROLOGICAL: Awake alert oriented, facial expression symmetrical, moving all extremities.      IMPRESSION:     Incisional hernia,  without obstruction or gangrene [K43.2] -From the periumbilical incision for attempted laparoscopic cholecystectomy that was converted to open. -Chronic sign of most likely due to granuloma. -Patient oriented about the new finding of incisional hernia.  I discussed the alternative of repair.  Due to the chronic stitch granuloma sinus I do not recommend to repair the hernia with mesh.  I offered patient primary repair.  He agreed.  I discussed with the patient the risk of surgery that includes injury to bowel, recurrence, pain, infection, bleeding, obstruction, among others.  Reported to and agreed to proceed.          PLAN:   1. Umbilical hernia repair (  [id number]) 2. Debridement of abdominal wall (11046) 3. Avoid taking aspirin 5 days before the surgery  4. Contact us if has any concern.   Patient or representative verbalized understanding, all questions were answered, and were agreeable with the plan outlined above.   [name], MD  Electronically signed by [name], MD

## 2019-06-25 NOTE — H&P (Signed)
HISTORY OF PRESENT ILLNESS:    Brent [name] is a 74 y.o.male patient who comes for evaluation of bulge in the periumbilical area.  Patient s/p open cholecystectomy.  He developed it periumbilical wound infection that was treated with incision and drainage.  Patient also treated with antibiotic therapy.  Initially the infection resolved but it recur.  Since then he has been having chronic drainage.  Patient denies any significant pain in that area.  There is no pain radiation.  There is no alleviating or aggravating factor.  Patient does feel like a bulge on the periumbilical area.  Last evaluation I ordered a CT scan.  I personally evaluated the images.  There is an incisional hernia without obstruction or gangrene.      PAST MEDICAL HISTORY:      Past Medical History:  Diagnosis Date  . Chronic osteoarthritis (left knee - xray 04/2018) 03/11/2019  . 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 Hyperlipidemia    LDL 103- 12/2012  . Internal hemorrhoids 03/22/2014  . Obesity   . Tubular adenoma of colon, unspecified 03/22/2014        PAST SURGICAL HISTORY:        Past Surgical History:  Procedure Laterality Date  . CHOLECYSTECTOMY  07/07/2018   Dr Lesli Albee  . COLONOSCOPY  03/22/2014   Dr. Dorthy Cooler @ [facility] - Tubular Adenomas, rpt 55yrs per [facility]  . COLONOSCOPY  08/05/2017   Tubular Adenoma: CBF 08/2020  . ENDOSCOPIC CARPAL TUNNEL RELEASE Left 10/12/2015   Dr.Menz  . SPLENECTOMY           MEDICATIONS:  Encounter Medications  Outpatient Encounter Medications as of 06/25/2019  Medication Sig Dispense Refill  . acetaminophen (TYLENOL) 500 MG tablet Take 2 tablets (1,000 mg total) by mouth every 8 (eight) hours 30 tablet 0  . aspirin 81 MG EC tablet Take 81 mg by mouth once daily.    Marland Kitchen atorvastatin (LIPITOR) 40 MG tablet Take 1 tablet by mouth once daily 90 tablet 1  . blood glucose diagnostic (CONTOUR NEXT STRIPS)  test strip Use as directed. Check CBG's twice weekly. Dx: E11.49 50 each 11  . hydroCHLOROthiazide (HYDRODIURIL) 12.5 MG tablet Take 1 tablet by mouth once daily 90 tablet 1  . lancets (MICROLET LANCET) as directed. Check CBG's once daily. Dx: E11.40 50 each 11  . lisinopriL (ZESTRIL) 40 MG tablet Take 1 tablet by mouth once daily 90 tablet 0  . metFORMIN (GLUCOPHAGE) 500 MG tablet Take 1 tablet by mouth once daily with breakfast 90 tablet 0  . sildenafiL (VIAGRA) 100 MG tablet TAKE 1 TABLET BY MOUTH ONCE DAILY AS NEEDED FOR ERECTILE DYSFUNCTION     No facility-administered encounter medications on file as of 06/25/2019.       ALLERGIES:   Patient has no known allergies.   SOCIAL HISTORY:  Social History          Socioeconomic History  . Marital status: Widowed    Spouse name: Not on file  . Number of children: Not on file  . Years of education: Not on file  . Highest education level: Not on file  Occupational History  . Not on file  Tobacco Use  . Smoking status: Former Smoker    Packs/day: 1.00    Years: 30.00    Pack years: 30.00    Types: Cigarettes, Cigars    Quit date: 02/05/1993    Years since  quitting: 26.4  . Smokeless tobacco: Never Used  Vaping Use  . Vaping Use: Never used  Substance and Sexual Activity  . Alcohol use: No    Alcohol/week: 0.0 standard drinks  . Drug use: No  . Sexual activity: Not Currently  Other Topics Concern  . Not on file  Social History Narrative   Marital Status- Widowed   Lives in [place]   Employment- Retired   [employer]   Social Determinants of Health      Financial Resource Strain:   . Difficulty of Paying Living Expenses:   Food Insecurity:   . Worried About Charity fundraiser in the Last Year:   . Arboriculturist in the Last Year:   Transportation Needs:   . Film/video editor (Medical):   Marland Kitchen Lack of Transportation (Non-Medical):        FAMILY HISTORY:       Family History  Problem Relation Age of Onset  . No Known Problems Mother   . Diabetes type II Father   . Kidney disease Father   . Heart disease Father   . Colon polyps Father   . No Known Problems Sister   . No Known Problems Sister      GENERAL REVIEW OF SYSTEMS:   General ROS: negative for - chills, fatigue, fever, weight gain or weight loss Allergy and Immunology ROS: negative for - hives  Hematological and Lymphatic ROS: negative for - bleeding problems or bruising, negative for palpable nodes Endocrine ROS: negative for - heat or cold intolerance, hair changes Respiratory ROS: negative for - cough, shortness of breath or wheezing Cardiovascular ROS: no chest pain or palpitations GI ROS: negative for nausea, vomiting, abdominal pain, diarrhea, constipation Musculoskeletal ROS: negative for - joint swelling or muscle pain Neurological ROS: negative for - confusion, syncope Dermatological ROS: negative for pruritus and rash  PHYSICAL EXAM:     Vitals:   06/25/19 0856  BP: 170/70  Pulse: 62  .  Ht:172.7 cm (5\' 8" ) Wt:(!) 108 kg (238 lb) [id number] surface area is 2.28 meters squared. Body mass index is 36.19 kg/m.Marland Kitchen   GENERAL: Alert, active, oriented x3  HEENT: Pupils equal reactive to light. Extraocular movements are intact. Sclera clear. Palpebral conjunctiva normal red color.Pharynx clear.  NECK: Supple with no palpable mass and no adenopathy.  LUNGS: Sound clear with no rales rhonchi or wheezes.  HEART: Regular rhythm S1 and S2 without murmur.  ABDOMEN: Soft and depressible, nontender with no palpable mass, no hepatomegaly.  Small wound opening on the supraumbilical area with chronic venous output  EXTREMITIES: Well-developed well-nourished symmetrical with no dependent edema.  NEUROLOGICAL: Awake alert oriented, facial expression symmetrical, moving all extremities.      IMPRESSION:     Incisional hernia,  without obstruction or gangrene [K43.2] -From the periumbilical incision for attempted laparoscopic cholecystectomy that was converted to open. -Chronic sign of most likely due to granuloma. -Patient oriented about the new finding of incisional hernia.  I discussed the alternative of repair.  Due to the chronic stitch granuloma sinus I do not recommend to repair the hernia with mesh.  I offered patient primary repair.  He agreed.  I discussed with the patient the risk of surgery that includes injury to bowel, recurrence, pain, infection, bleeding, obstruction, among others.  Reported to and agreed to proceed.          PLAN:   1. Umbilical hernia repair (  [id number]) 2. Debridement of abdominal wall (11046) 3. Avoid taking aspirin 5 days before the surgery  4. Contact us if has any concern.   Patient or representative verbalized understanding, all questions were answered, and were agreeable with the plan outlined above.   [name], MD  Electronically signed by [name], MD

## 2019-07-07 ENCOUNTER — Encounter
Admission: RE | Admit: 2019-07-07 | Discharge: 2019-07-07 | Disposition: A | Discharge status: Home / Self Care | Payer: Medicare Other | Source: Ambulatory Visit | Attending: General Surgery | Admitting: General Surgery

## 2019-07-07 ENCOUNTER — Other Ambulatory Visit: Payer: Self-pay

## 2019-07-07 DIAGNOSIS — Z01818 Encounter for other preprocedural examination: Secondary | ICD-10-CM | POA: Insufficient documentation

## 2019-07-07 NOTE — Patient Instructions (Signed)
Your procedure is scheduled on: Fri. 6/18 Report to Day Surgery. To find out your arrival time please call [phone] between 1PM - 3PM on Thurs 6/17.  Remember: Instructions that are not followed completely may result in serious medical risk,  up to and including death, or upon the discretion of your surgeon and anesthesiologist your  surgery may need to be rescheduled.     _X__ 1. Do not eat food after midnight the night before your procedure.                 No gum chewing or hard candies. You may drink clear liquids up to 2 hours                 before you are scheduled to arrive for your surgery- DO not drink clear                 liquids within 2 hours of the start of your surgery.                 Clear Liquids include:  water,Gatorade, G2 or                  Gatorade Zero (avoid Red/Purple/Blue), Black Coffee or Tea (Do not add                 anything to coffee or tea). _____2.   Complete the carbohydrate drink provided to you, 2 hours before arrival.  __X__2.  On the morning of surgery brush your teeth with toothpaste and water, you                may rinse your mouth with mouthwash if you wish.  Do not swallow any toothpaste of mouthwash.     ___ 3.  No Alcohol for 24 hours before or after surgery.   ___ 4.  Do Not Smoke or use e-cigarettes For 24 Hours Prior to Your Surgery.                 Do not use any chewable tobacco products for at least 6 hours prior to                 Surgery.  ___  5.  Do not use any recreational drugs (marijuana, cocaine, heroin, ecstacy, MDMA or other)                For at least one week prior to your surgery.  Combination of these drugs with anesthesia                May have life threatening results.  ____  6.  Bring all medications with you on the day of surgery if instructed.   __x__  7.  Notify your doctor if there is any change in your medical condition      (cold, fever, infections).     Do not wear  jewelry,. Do not wear lotions, powders, or perfumes. You may wear deodorant. Do not shave 48 hours prior to surgery. Men may shave face and neck. Do not bring valuables to the hospital.    [facility] is not responsible for any belongings or valuables.  Contacts, dentures or bridgework may not be worn into surgery. Leave your suitcase in the car. After surgery it may be brought to your room. For patients admitted to the hospital, discharge time is determined by your treatment team.   Patients discharged the day of surgery will not be  allowed to drive home.   Make arrangements for someone to be with you for the first 24 hours of your Same Day Discharge.    Please read over the following fact sheets that you were given:    __x__ Take these medicines the morning of surgery with A SIP OF WATER:    1. acetaminophen (TYLENOL) 500 MG  if needed  2.   3.   4.  5.  6.  ____ Fleet Enema (as directed)   _x___ Use CHG Soap (or wipes) as directed  ____ Use Benzoyl Peroxide Gel as instructed  ____ Use inhalers on the day of surgery  __x__ Stop metformin 2 days prior to surgery  Last dose on Tues. 6/15     ____ Take 1/2 of usual insulin dose the night before surgery. No insulin the morning          of surgery.   __x__ Stop aspirin on Sunday 6/13 ( last dose)   __x__ Stop Anti-inflammatories ibuprofen or aleve on 6/11  May take tylenol   ____ Stop supplements until after surgery.    ____ Bring C-Pap to the hospital.

## 2019-07-15 ENCOUNTER — Other Ambulatory Visit: Payer: Medicare Other

## 2019-07-22 ENCOUNTER — Other Ambulatory Visit: Payer: Self-pay

## 2019-07-22 ENCOUNTER — Other Ambulatory Visit
Admission: RE | Admit: 2019-07-22 | Discharge: 2019-07-22 | Disposition: A | Discharge status: Home / Self Care | Payer: Medicare Other | Source: Ambulatory Visit | Attending: General Surgery | Admitting: General Surgery

## 2019-07-22 DIAGNOSIS — Z01812 Encounter for preprocedural laboratory examination: Secondary | ICD-10-CM | POA: Insufficient documentation

## 2019-07-22 DIAGNOSIS — Z20822 Contact with and (suspected) exposure to covid-19: Secondary | ICD-10-CM | POA: Insufficient documentation

## 2019-07-22 LAB — SARS CORONAVIRUS 2 (TAT 6-24 HRS): SARS Coronavirus 2: NEGATIVE

## 2019-07-24 ENCOUNTER — Encounter
Admission: RE | Disposition: A | Discharge status: Home / Self Care | Payer: Self-pay | Source: Home / Self Care | Attending: General Surgery

## 2019-07-24 ENCOUNTER — Encounter: Payer: Self-pay | Admitting: General Surgery

## 2019-07-24 ENCOUNTER — Ambulatory Visit: Payer: Medicare Other | Admitting: Certified Registered"

## 2019-07-24 ENCOUNTER — Other Ambulatory Visit: Payer: Self-pay

## 2019-07-24 ENCOUNTER — Ambulatory Visit
Admission: RE | Admit: 2019-07-24 | Discharge: 2019-07-24 | Disposition: A | Discharge status: Home / Self Care | Payer: Medicare Other | Attending: General Surgery | Admitting: General Surgery

## 2019-07-24 DIAGNOSIS — Z79899 Other long term (current) drug therapy: Secondary | ICD-10-CM | POA: Insufficient documentation

## 2019-07-24 DIAGNOSIS — Z7984 Long term (current) use of oral hypoglycemic drugs: Secondary | ICD-10-CM | POA: Diagnosis not present

## 2019-07-24 DIAGNOSIS — E1140 Type 2 diabetes mellitus with diabetic neuropathy, unspecified: Secondary | ICD-10-CM | POA: Insufficient documentation

## 2019-07-24 DIAGNOSIS — E785 Hyperlipidemia, unspecified: Secondary | ICD-10-CM | POA: Insufficient documentation

## 2019-07-24 DIAGNOSIS — K432 Incisional hernia without obstruction or gangrene: Secondary | ICD-10-CM | POA: Diagnosis not present

## 2019-07-24 DIAGNOSIS — M1712 Unilateral primary osteoarthritis, left knee: Secondary | ICD-10-CM | POA: Insufficient documentation

## 2019-07-24 DIAGNOSIS — Z6837 Body mass index (BMI) 37.0-37.9, adult: Secondary | ICD-10-CM | POA: Insufficient documentation

## 2019-07-24 DIAGNOSIS — Z7982 Long term (current) use of aspirin: Secondary | ICD-10-CM | POA: Diagnosis not present

## 2019-07-24 DIAGNOSIS — Z87891 Personal history of nicotine dependence: Secondary | ICD-10-CM | POA: Insufficient documentation

## 2019-07-24 DIAGNOSIS — I10 Essential (primary) hypertension: Secondary | ICD-10-CM | POA: Insufficient documentation

## 2019-07-24 DIAGNOSIS — E669 Obesity, unspecified: Secondary | ICD-10-CM | POA: Diagnosis not present

## 2019-07-24 HISTORY — PX: UMBILICAL HERNIA REPAIR: SHX196

## 2019-07-24 HISTORY — PX: DEBRIDEMENT OF ABDOMINAL WALL ABSCESS: SHX6396

## 2019-07-24 LAB — GLUCOSE, CAPILLARY
Glucose-Capillary: 174 mg/dL — ABNORMAL HIGH (ref 70–99)
Glucose-Capillary: 191 mg/dL — ABNORMAL HIGH (ref 70–99)

## 2019-07-24 SURGERY — REPAIR, HERNIA, UMBILICAL, ADULT
Anesthesia: General | Site: Abdomen

## 2019-07-24 MED ORDER — SUCCINYLCHOLINE CHLORIDE 20 MG/ML IJ SOLN
INTRAMUSCULAR | Status: DC | PRN
  Administered 2019-07-24: 1200 mg via INTRAVENOUS

## 2019-07-24 MED ORDER — CEFAZOLIN SODIUM-DEXTROSE 2-4 GM/100ML-% IV SOLN
INTRAVENOUS | Status: AC
  Filled 2019-07-24: qty 100

## 2019-07-24 MED ORDER — DEXAMETHASONE SODIUM PHOSPHATE 10 MG/ML IJ SOLN
INTRAMUSCULAR | Status: AC
  Filled 2019-07-24: qty 1

## 2019-07-24 MED ORDER — LACTATED RINGERS IV SOLN: INTRAVENOUS | Status: DC | PRN

## 2019-07-24 MED ORDER — CEFAZOLIN SODIUM-DEXTROSE 2-4 GM/100ML-% IV SOLN
2.0000 g | INTRAVENOUS | Status: AC
  Administered 2019-07-24: 2 g via INTRAVENOUS

## 2019-07-24 MED ORDER — SUCCINYLCHOLINE CHLORIDE 200 MG/10ML IV SOSY
PREFILLED_SYRINGE | INTRAVENOUS | Status: AC
  Filled 2019-07-24: qty 10

## 2019-07-24 MED ORDER — HYDROCODONE-ACETAMINOPHEN 5-325 MG PO TABS
1.0000 | ORAL_TABLET | Freq: Once | ORAL | Status: AC
  Administered 2019-07-24: 1 via ORAL

## 2019-07-24 MED ORDER — BUPIVACAINE-EPINEPHRINE 0.5% -1:200000 IJ SOLN
INTRAMUSCULAR | Status: DC | PRN
  Administered 2019-07-24: 30 mL

## 2019-07-24 MED ORDER — SODIUM CHLORIDE 0.9 % IV SOLN: INTRAVENOUS | Status: DC

## 2019-07-24 MED ORDER — ACETAMINOPHEN 10 MG/ML IV SOLN
INTRAVENOUS | Status: DC | PRN
  Administered 2019-07-24: 1000 mg via INTRAVENOUS

## 2019-07-24 MED ORDER — FENTANYL CITRATE (PF) 100 MCG/2ML IJ SOLN: 25.0000 ug | INTRAMUSCULAR | Status: DC | PRN

## 2019-07-24 MED ORDER — PROPOFOL 10 MG/ML IV BOLUS
INTRAVENOUS | Status: AC
  Filled 2019-07-24: qty 20

## 2019-07-24 MED ORDER — ONDANSETRON HCL 4 MG/2ML IJ SOLN
INTRAMUSCULAR | Status: DC | PRN
  Administered 2019-07-24: 4 mg via INTRAVENOUS

## 2019-07-24 MED ORDER — HYDROCODONE-ACETAMINOPHEN 5-325 MG PO TABS: 1.0000 | ORAL_TABLET | ORAL | 0 refills | Status: AC | PRN

## 2019-07-24 MED ORDER — SUGAMMADEX SODIUM 200 MG/2ML IV SOLN
INTRAVENOUS | Status: DC | PRN
  Administered 2019-07-24: 200 mg via INTRAVENOUS

## 2019-07-24 MED ORDER — FENTANYL CITRATE (PF) 100 MCG/2ML IJ SOLN
INTRAMUSCULAR | Status: AC
  Filled 2019-07-24: qty 2

## 2019-07-24 MED ORDER — FENTANYL CITRATE (PF) 100 MCG/2ML IJ SOLN
INTRAMUSCULAR | Status: DC | PRN
  Administered 2019-07-24 (×2): 50 ug via INTRAVENOUS

## 2019-07-24 MED ORDER — HYDROCODONE-ACETAMINOPHEN 5-325 MG PO TABS
ORAL_TABLET | ORAL | Status: AC
  Filled 2019-07-24: qty 1

## 2019-07-24 MED ORDER — ROCURONIUM BROMIDE 10 MG/ML (PF) SYRINGE
PREFILLED_SYRINGE | INTRAVENOUS | Status: AC
  Filled 2019-07-24: qty 10

## 2019-07-24 MED ORDER — GLYCOPYRROLATE 0.2 MG/ML IJ SOLN
INTRAMUSCULAR | Status: DC | PRN
  Administered 2019-07-24: .2 mg via INTRAVENOUS

## 2019-07-24 MED ORDER — ONDANSETRON HCL 4 MG/2ML IJ SOLN
INTRAMUSCULAR | Status: AC
  Filled 2019-07-24: qty 2

## 2019-07-24 MED ORDER — ONDANSETRON HCL 4 MG/2ML IJ SOLN: 4.0000 mg | Freq: Once | INTRAMUSCULAR | Status: DC | PRN

## 2019-07-24 MED ORDER — LIDOCAINE HCL (CARDIAC) PF 100 MG/5ML IV SOSY
PREFILLED_SYRINGE | INTRAVENOUS | Status: DC | PRN
  Administered 2019-07-24: 80 mg via INTRAVENOUS

## 2019-07-24 MED ORDER — PROPOFOL 10 MG/ML IV BOLUS
INTRAVENOUS | Status: DC | PRN
  Administered 2019-07-24: 200 mg via INTRAVENOUS

## 2019-07-24 MED ORDER — EPHEDRINE SULFATE 50 MG/ML IJ SOLN
INTRAMUSCULAR | Status: DC | PRN
  Administered 2019-07-24: 10 mg via INTRAVENOUS

## 2019-07-24 MED ORDER — ACETAMINOPHEN 10 MG/ML IV SOLN
INTRAVENOUS | Status: AC
  Filled 2019-07-24: qty 100

## 2019-07-24 MED ORDER — EPHEDRINE 5 MG/ML INJ
INTRAVENOUS | Status: AC
  Filled 2019-07-24: qty 10

## 2019-07-24 MED ORDER — CHLORHEXIDINE GLUCONATE 0.12 % MT SOLN
15.0000 mL | Freq: Once | OROMUCOSAL | Status: AC
  Administered 2019-07-24: 15 mL via OROMUCOSAL

## 2019-07-24 MED ORDER — CHLORHEXIDINE GLUCONATE 0.12 % MT SOLN
OROMUCOSAL | Status: AC
  Filled 2019-07-24: qty 15

## 2019-07-24 MED ORDER — ROCURONIUM BROMIDE 100 MG/10ML IV SOLN
INTRAVENOUS | Status: DC | PRN
  Administered 2019-07-24: 50 mg via INTRAVENOUS

## 2019-07-24 MED ORDER — ORAL CARE MOUTH RINSE: 15.0000 mL | Freq: Once | OROMUCOSAL | Status: AC

## 2019-07-24 SURGICAL SUPPLY — 47 items
BLADE CLIPPER SURG (BLADE) ×3 IMPLANT
BLADE SURG 15 STRL LF DISP TIS (BLADE) ×1 IMPLANT
BLADE SURG 15 STRL SS (BLADE) ×2
BRUSH SCRUB EZ  4% CHG (MISCELLANEOUS) ×2
BRUSH SCRUB EZ 4% CHG (MISCELLANEOUS) ×1 IMPLANT
CANISTER SUCT 1200ML W/VALVE (MISCELLANEOUS) ×3 IMPLANT
CANISTER SUCT 3000ML PPV (MISCELLANEOUS) ×3 IMPLANT
CHLORAPREP W/TINT 26 (MISCELLANEOUS) ×3 IMPLANT
COVER WAND RF STERILE (DRAPES) ×3 IMPLANT
DERMABOND ADVANCED (GAUZE/BANDAGES/DRESSINGS) ×2
DERMABOND ADVANCED .7 DNX12 (GAUZE/BANDAGES/DRESSINGS) ×1 IMPLANT
DRAPE CHEST BREAST 77X106 FENE (MISCELLANEOUS) IMPLANT
DRAPE LAPAROTOMY 100X77 ABD (DRAPES) ×3 IMPLANT
DRAPE LAPAROTOMY 77X122 PED (DRAPES) ×3 IMPLANT
DRAPE SHEET LG 3/4 BI-LAMINATE (DRAPES) ×3 IMPLANT
ELECT REM PT RETURN 9FT ADLT (ELECTROSURGICAL) ×3
ELECTRODE REM PT RTRN 9FT ADLT (ELECTROSURGICAL) ×1 IMPLANT
GAUZE 4X4 16PLY RFD (DISPOSABLE) ×3 IMPLANT
GAUZE SPONGE 4X4 12PLY STRL (GAUZE/BANDAGES/DRESSINGS) ×3 IMPLANT
GLOVE BIO SURGEON STRL SZ 6.5 (GLOVE) ×2 IMPLANT
GLOVE BIO SURGEONS STRL SZ 6.5 (GLOVE) ×1
GLOVE BIOGEL PI IND STRL 6.5 (GLOVE) ×1 IMPLANT
GLOVE BIOGEL PI INDICATOR 6.5 (GLOVE) ×2
GOWN STRL REUS W/ TWL LRG LVL3 (GOWN DISPOSABLE) ×2 IMPLANT
GOWN STRL REUS W/TWL LRG LVL3 (GOWN DISPOSABLE) ×4
KIT TURNOVER KIT A (KITS) ×3 IMPLANT
LABEL OR SOLS (LABEL) ×3 IMPLANT
MESH VENTRALEX ST 8CM LRG (Mesh General) ×3 IMPLANT
NEEDLE HYPO 22GX1.5 SAFETY (NEEDLE) ×3 IMPLANT
NEEDLE HYPO 25X1 1.5 SAFETY (NEEDLE) ×3 IMPLANT
NS IRRIG 1000ML POUR BTL (IV SOLUTION) ×3 IMPLANT
NS IRRIG 500ML POUR BTL (IV SOLUTION) ×3 IMPLANT
PACK BASIN MINOR (MISCELLANEOUS) ×3 IMPLANT
SOL PREP PVP 2OZ (MISCELLANEOUS) ×6
SOLUTION PREP PVP 2OZ (MISCELLANEOUS) ×2 IMPLANT
SPONGE LAP 18X18 RF (DISPOSABLE) ×3 IMPLANT
STAPLER SKIN PROX 35W (STAPLE) ×3 IMPLANT
SUT ETHIBOND 0 (SUTURE) ×9 IMPLANT
SUT MNCRL 4-0 (SUTURE) ×2
SUT MNCRL 4-0 27XMFL (SUTURE) ×1
SUT PDS PLUS 0 (SUTURE) ×4
SUT PDS PLUS AB 0 CT-2 (SUTURE) ×2 IMPLANT
SUT PROLENE 0 CT 2 (SUTURE) ×6 IMPLANT
SUT VIC AB 2-0 SH 27 (SUTURE) ×2
SUT VIC AB 2-0 SH 27XBRD (SUTURE) ×1 IMPLANT
SUTURE MNCRL 4-0 27XMF (SUTURE) ×1 IMPLANT
SYR 10ML LL (SYRINGE) ×3 IMPLANT

## 2019-07-24 NOTE — Discharge Instructions (Addendum)
  Diet: Resume home heart healthy regular diet.   Activity: No heavy lifting >20 pounds (children, pets, laundry, garbage) or strenuous activity until follow-up, but light activity and walking are encouraged. Do not drive or drink alcohol if taking narcotic pain medications.  Wound care: Remove dressing tomorrow. Once dressing removed, may shower with soapy water and pat dry (do not rub incisions), but no baths or submerging incision underwater until follow-up. (no swimming)   Medications: Resume all home medications. For mild to moderate pain: acetaminophen (Tylenol) or ibuprofen (if no kidney disease). Combining Tylenol with alcohol can substantially increase your risk of causing liver disease. Narcotic pain medications, if prescribed, can be used for severe pain, though may cause nausea, constipation, and drowsiness. Do not combine Tylenol and Norco within a 6 hour period as Norco contains Tylenol. If you do not need the narcotic pain medication, you do not need to fill the prescription.  Call office [phone]) at any time if any questions, worsening pain, fevers/chills, bleeding, drainage from incision site, or other concerns.    AMBULATORY SURGERY  DISCHARGE INSTRUCTIONS   1) The drugs that you were given will stay in your system until tomorrow so for the next 24 hours you should not:  A) Drive an automobile B) Make any legal decisions C) Drink any alcoholic beverage   2) You may resume regular meals tomorrow.  Today it is better to start with liquids and gradually work up to solid foods.  You may eat anything you prefer, but it is better to start with liquids, then soup and crackers, and gradually work up to solid foods.   3) Please notify your doctor immediately if you have any unusual bleeding, trouble breathing, redness and pain at the surgery site, drainage, fever, or pain not relieved by medication.    4) Additional Instructions:        Please contact your  physician with any problems or Same Day Surgery at [phone], Monday through Friday 6 am to 4 pm, or [place] at [facility] number at [phone].

## 2019-07-24 NOTE — Interval H&P Note (Signed)
History and Physical Interval Note:  07/24/2019 11:49 AM  Brent [name].  has presented today for surgery, with the diagnosis of K43.2 Incisional hernia w/o obstruction or gangrene.  The various methods of treatment have been discussed with the patient and family. After consideration of risks, benefits and other options for treatment, the patient has consented to  Procedure(s): HERNIA REPAIR UMBILICAL ADULT (N/A) DEBRIDEMENT OF ABDOMINAL WALL ABSCESS (N/A) as a surgical intervention.  The patient's history has been reviewed, patient examined, no change in status, stable for surgery.  I have reviewed the patient's chart and labs.  Questions were answered to the patient's satisfaction.     [name]

## 2019-07-24 NOTE — Anesthesia Preprocedure Evaluation (Addendum)
Anesthesia Evaluation  Patient identified by MRN, date of birth, ID band Patient awake    Reviewed: Allergy & Precautions, H&P , NPO status , Patient's Chart, lab work & pertinent test results  History of Anesthesia Complications Negative for: history of anesthetic complications  Airway Mallampati: III  TM Distance: <3 FB Neck ROM: full    Dental  (+) Chipped   Pulmonary neg shortness of breath, former smoker,           Cardiovascular Exercise Tolerance: Good hypertension, (-) angina(-) Past MI and (-) DOE      Neuro/Psych negative neurological ROS  negative psych ROS   GI/Hepatic Neg liver ROS, PUD, GERD  Medicated and Controlled,  Endo/Other  diabetes, Type 2, Oral Hypoglycemic Agents  Renal/GU      Musculoskeletal  (+) Arthritis ,   Abdominal   Peds negative pediatric ROS (+)  Hematology negative hematology ROS (+) anemia ,   Anesthesia Other Findings Past Medical History: No date: Arthritis     Comment:  FINGERS No date: Diabetes mellitus without complication (HCC) No date: GERD (gastroesophageal reflux disease)     Comment:  RARE No date: Hyperlipidemia No date: Hypertension No date: Spherocytosis, hereditary ([place])     Comment:  HAD SPLEEN REMOVED DUE TO THIS IN 7TH GRADE  Past Surgical History: 10/2015: CARPAL TUNNEL RELEASE; Left No date: COLONOSCOPY 08/05/2017: COLONOSCOPY WITH PROPOFOL; N/A     Comment:  Procedure: COLONOSCOPY WITH PROPOFOL;  Surgeon: Manya Silvas, MD;  Location: [facility] ENDOSCOPY;  Service:               Endoscopy;  Laterality: N/A; 05/16/2018: ENDOSCOPIC RETROGRADE CHOLANGIOPANCREATOGRAPHY (ERCP) WITH  PROPOFOL; N/A     Comment:  Procedure: ENDOSCOPIC RETROGRADE               CHOLANGIOPANCREATOGRAPHY (ERCP) WITH PROPOFOL;  Surgeon:               Lucilla Lame, MD;  Location: [facility] ENDOSCOPY;  Service:               Endoscopy;  Laterality: N/A; No date:  SPLENECTOMY  BMI    Body Mass Index:  32.69 kg/m   ? Hx of Afib on EKG last year...    Reproductive/Obstetrics negative OB ROS                            Anesthesia Physical  Anesthesia Plan  ASA: III  Anesthesia Plan: General ETT   Post-op Pain Management:    Induction: Intravenous  PONV Risk Score and Plan: Ondansetron, Dexamethasone, Midazolam and Treatment may vary due to age or medical condition  Airway Management Planned: Oral ETT  Additional Equipment:   Intra-op Plan:   Post-operative Plan: Extubation in OR  Informed Consent: I have reviewed the patients History and Physical, chart, labs and discussed the procedure including the risks, benefits and alternatives for the proposed anesthesia with the patient or authorized representative who has indicated his/her understanding and acceptance.     Dental Advisory Given  Plan Discussed with: Anesthesiologist, CRNA and Surgeon  Anesthesia Plan Comments: (Patient consented for risks of anesthesia including but not limited to:  - adverse reactions to medications - damage to teeth, lips or other oral mucosa - sore throat or hoarseness - Damage to heart, brain, lungs or loss of life  Patient voiced understanding.)  Anesthesia Quick Evaluation  

## 2019-07-24 NOTE — Op Note (Signed)
Preoperative diagnosis: Incisional hernia  Postoperative diagnosis: Incisional hernia  Procedure:  Incisional hernia repair                        Insertion of mesh  Anesthesia: General  Surgeon: [name], MD  Wound Classification: Clean  Specimen: None  Complications: None  Estimated Blood Loss: Minimal  Indications: A 74 year old male with a symptomatic incisional hernia.  Repair was indicated to improve discomfort and decrease risk of incarceration and/or strangulation.  Findings: 1.  4 cm x 4 cm supra umbilical incisional hernia 2. Tension free repair achieved with suture 3. Adequate hemostasis  Description of procedure: The patient was brought to the operating room and general anesthesia was induced. A time-out was completed verifying correct patient, procedure, site, positioning, and implant(s) and/or special equipment prior to beginning this procedure. Antibiotics were administered prior to making the incision. SCDs placed. The anterior abdominal wall was prepped and draped in the standard sterile fashion.   An supra umbilical incision was done including the old scar and drainage area.   Dissection carried down to fascia where the hernia sac was noted.  The hernia sac was transected and there was noted to be a 4 cm x 4 cm hernia defect.  The omental fat contents were dissected off the surrounding structures and reduced.  Hemostasis was confirmed prior to reducing the actual contents.    The anterior abdominal fascia was released circumferentially. An 8 cm round mesh was inserted in the defect. The mesh was fixed with 0 Ehtibond circumferentially.   The defect itself was primary closed using 0-PDS in a figure-of-eight fashion.  After confirming hemostasis, the wound was irrigated and closed in a multilayer fashion, using 2-0 Vicryl for the deep dermal layer in an interrupted fashion and skin staples.  Wound was then dressed with simple gauze.  Patient was then  successfully awakened and transferred to PACU in stable condition.  At the end of the procedure sponge and instrument counts were correct

## 2019-07-24 NOTE — Transfer of Care (Signed)
Immediate Anesthesia Transfer of Care Note  Patient: Brent [name].  Procedure(s) Performed: HERNIA REPAIR UMBILICAL ADULT (N/A Abdomen) DEBRIDEMENT OF ABDOMINAL WALL ABSCESS (N/A Abdomen)  Patient Location: PACU  Anesthesia Type:General  Level of Consciousness: awake and drowsy  Airway & Oxygen Therapy: Patient Spontanous Breathing and Patient connected to face mask oxygen  Post-op Assessment: Report given to RN and Post -op Vital signs reviewed and stable  Post vital signs: Reviewed and stable  Last Vitals:  Vitals Value Taken Time  BP 145/64 07/24/19 1404  Temp    Pulse 73 07/24/19 1408  Resp 15 07/24/19 1408  SpO2 98 % 07/24/19 1408  Vitals shown include unvalidated device data.  Last Pain:  Vitals:   07/24/19 1026  TempSrc: Tympanic  PainSc: 0-No pain         Complications: No complications documented.

## 2019-07-24 NOTE — Anesthesia Procedure Notes (Signed)
Procedure Name: Intubation Date/Time: 07/24/2019 12:33 PM Performed by: Jerrye Noble, CRNA Pre-anesthesia Checklist: Patient identified, Emergency Drugs available, Suction available and Patient being monitored Patient Re-evaluated:Patient Re-evaluated prior to induction Oxygen Delivery Method: Circle system utilized Preoxygenation: Pre-oxygenation with 100% oxygen Induction Type: IV induction Ventilation: Mask ventilation without difficulty Laryngoscope Size: McGraph and 4 Grade View: Grade I Tube type: Oral Tube size: 7.5 mm Number of attempts: 1 Airway Equipment and Method: Stylet and Oral airway Placement Confirmation: ETT inserted through vocal cords under direct vision,  positive ETCO2 and breath sounds checked- equal and bilateral Secured at: 23 cm Tube secured with: Tape Dental Injury: Teeth and Oropharynx as per pre-operative assessment

## 2019-07-24 NOTE — Anesthesia Postprocedure Evaluation (Signed)
Anesthesia Post Note  Patient: Orland Visconti.  Procedure(s) Performed: HERNIA REPAIR UMBILICAL ADULT (N/A Abdomen) DEBRIDEMENT OF ABDOMINAL WALL ABSCESS (N/A Abdomen)  Patient location during evaluation: PACU Anesthesia Type: General Level of consciousness: awake and alert Pain management: pain level controlled Vital Signs Assessment: post-procedure vital signs reviewed and stable Respiratory status: spontaneous breathing, nonlabored ventilation and respiratory function stable Cardiovascular status: blood pressure returned to baseline and stable Postop Assessment: no apparent nausea or vomiting Anesthetic complications: no   No complications documented.   Last Vitals:  Vitals:   07/24/19 1419 07/24/19 1434  BP: 138/74 140/68  Pulse: 66 76  Resp: (!) 22 20  Temp:    SpO2: 96% 93%    Last Pain:  Vitals:   07/24/19 1434  TempSrc:   PainSc: 0-No pain                 Brett Canales Parnika Tweten

## 2019-07-27 ENCOUNTER — Encounter: Payer: Self-pay | Admitting: General Surgery

## 2019-07-28 LAB — SURGICAL PATHOLOGY

## 2019-12-23 ENCOUNTER — Ambulatory Visit (INDEPENDENT_AMBULATORY_CARE_PROVIDER_SITE_OTHER): Payer: Medicare Other | Admitting: Dermatology

## 2019-12-23 ENCOUNTER — Other Ambulatory Visit: Payer: Self-pay

## 2019-12-23 DIAGNOSIS — Z1283 Encounter for screening for malignant neoplasm of skin: Secondary | ICD-10-CM | POA: Diagnosis not present

## 2019-12-23 DIAGNOSIS — D692 Other nonthrombocytopenic purpura: Secondary | ICD-10-CM | POA: Diagnosis not present

## 2019-12-23 DIAGNOSIS — L918 Other hypertrophic disorders of the skin: Secondary | ICD-10-CM | POA: Diagnosis not present

## 2019-12-23 DIAGNOSIS — L814 Other melanin hyperpigmentation: Secondary | ICD-10-CM

## 2019-12-23 DIAGNOSIS — L821 Other seborrheic keratosis: Secondary | ICD-10-CM

## 2019-12-23 DIAGNOSIS — L820 Inflamed seborrheic keratosis: Secondary | ICD-10-CM | POA: Diagnosis not present

## 2019-12-23 DIAGNOSIS — L578 Other skin changes due to chronic exposure to nonionizing radiation: Secondary | ICD-10-CM

## 2019-12-23 DIAGNOSIS — D1800 Hemangioma unspecified site: Secondary | ICD-10-CM

## 2019-12-23 DIAGNOSIS — D229 Melanocytic nevi, unspecified: Secondary | ICD-10-CM

## 2019-12-23 NOTE — Progress Notes (Signed)
   Follow-Up Visit   Subjective  Brent [name]. is a 74 y.o. male who presents for the following: Other (Spot on right forearm that is scaly. Spot of left temple that was treated with LN2 at last appointment and there is a little bit left. Red spot of nose for a long time. He thinks he may have gotten hit by something.) and Annual Exam (No history of skin cancer or abnormal moles - UBSE today). The patient presents for Upper Body Skin Exam (UBSE) for skin cancer screening and mole check.  The following portions of the chart were reviewed this encounter and updated as appropriate:  Tobacco  Allergies  Meds  Problems  Med Hx  Surg Hx  Fam Hx     Review of Systems:  No other skin or systemic complaints except as noted in HPI or Assessment and Plan.  Objective  Well appearing patient in no apparent distress; mood and affect are within normal limits.  All skin waist up examined.  Objective  Right Forearm x 1, left temple x 1 (2): Erythematous keratotic or waxy stuck-on papule or plaque.    Assessment & Plan    Acrochordons (Skin Tags) - Fleshy, skin-colored pedunculated papules - Benign appearing.  - Observe. - If desired, they can be removed with an in office procedure that is not covered by insurance. - Please call the clinic if you notice any new or changing lesions.  Purpura - Chronic; persistent and recurrent.  Treatable, but not curable. - Violaceous macules and patches - Benign - Related to age, sun damage and/or use of blood thinners - Observe - Can use OTC arnica containing moisturizer such as Dermend Bruise Formula if desired - Call for worsening or other concerns  Lentigines - Scattered tan macules - Discussed due to sun exposure - Benign, observe - Call for any changes  Seborrheic Keratoses - Stuck-on, waxy, tan-brown papules and plaques  - Discussed benign etiology and prognosis. - Observe - Call for any changes  Melanocytic Nevi - Tan-brown  and/or pink-flesh-colored symmetric macules and papules - Benign appearing on exam today - Observation - Call clinic for new or changing moles - Recommend daily use of broad spectrum spf 30+ sunscreen to sun-exposed areas.   Hemangiomas - Red papules - Discussed benign nature - Observe - Call for any changes  Actinic Damage - Chronic, secondary to cumulative UV/sun exposure - diffuse scaly erythematous macules with underlying dyspigmentation - Recommend daily broad spectrum sunscreen SPF 30+ to sun-exposed areas, reapply every 2 hours as needed.  - Call for new or changing lesions.  Skin cancer screening performed today.  Inflamed seborrheic keratosis (2) Right Forearm x 1, left temple x 1  Destruction of lesion - Right Forearm x 1, left temple x 1 Complexity: simple   Destruction method: cryotherapy   Informed consent: discussed and consent obtained   Timeout:  patient name, date of birth, surgical site, and procedure verified Lesion destroyed using liquid nitrogen: Yes   Region frozen until ice ball extended beyond lesion: Yes   Outcome: patient tolerated procedure well with no complications   Post-procedure details: wound care instructions given    Skin cancer screening  Return if symptoms worsen or fail to improve.  I, Ashok Cordia, CMA, am acting as scribe for Sarina Ser, MD .  Documentation: I have reviewed the above documentation for accuracy and completeness, and I agree with the above.  Sarina Ser, MD

## 2019-12-23 NOTE — Patient Instructions (Signed)

## 2019-12-25 ENCOUNTER — Other Ambulatory Visit: Payer: Self-pay

## 2019-12-25 ENCOUNTER — Other Ambulatory Visit
Admission: RE | Admit: 2019-12-25 | Discharge: 2019-12-25 | Disposition: A | Discharge status: Home / Self Care | Payer: Medicare Other | Source: Ambulatory Visit | Attending: Orthopedic Surgery | Admitting: Orthopedic Surgery

## 2019-12-25 NOTE — Patient Instructions (Signed)
Your procedure is scheduled on: Wednesday January 06, 2020. Report to Day Surgery inside [facility] 2nd floor (stop by registration desk first). To find out your arrival time please call [phone] between 1PM - 3PM on Tuesday January 05, 2020.  Remember: Instructions that are not followed completely may result in serious medical risk,  up to and including death, or upon the discretion of your surgeon and anesthesiologist your  surgery may need to be rescheduled.     _X__ 1. Do not eat food after midnight the night before your procedure.                 No chewing gum or hard candies. You may drink clear liquids up to 2 hours                 before you are scheduled to arrive for your surgery- DO not drink clear                 liquids within 2 hours of the start of your surgery.                 Clear Liquids include:  water, apple juice without pulp, clear Gatorade, G2 or                  Gatorade Zero (avoid Red/Purple/Blue), Black Coffee or Tea (Do not add                 anything to coffee or tea).  ___X_2. If you are diabetic you can have low carbohydrate drink like Gatorade Zero or G2. You can drink this, 2 hours prior to your arrival time.   __X__3.  On the morning of surgery brush your teeth with toothpaste and water, you                may rinse your mouth with mouthwash if you wish.  Do not swallow any toothpaste of mouthwash.     _X__ 4.  No Alcohol for 24 hours before or after surgery.   _X__ 5.  Do Not Smoke or use e-cigarettes For 24 Hours Prior to Your Surgery.                 Do not use any chewable tobacco products for at least 6 hours prior to                 Surgery.  _X__  6.  Do not use any recreational drugs (marijuana, cocaine, heroin, ecstasy, MDMA or other)                For at least one week prior to your surgery.  Combination of these drugs with anesthesia                May have life threatening results.  __X__  7.  Notify your  doctor if there is any change in your medical condition      (cold, fever, infections).     Do not wear jewelry, make-up, hairpins, clips or nail polish. Do not wear lotions, powders, or perfumes. You may wear deodorant. Do not shave 48 hours prior to surgery. Men may shave face and neck. Do not bring valuables to the hospital.    [facility] is not responsible for any belongings or valuables.  Contacts, dentures or bridgework may not be worn into surgery. Leave your suitcase in the car. After surgery it may be brought to your room. For patients  admitted to the hospital, discharge time is determined by your treatment team.   Patients discharged the day of surgery will not be allowed to drive home.   Make arrangements for someone to be with you for the first 24 hours of your Same Day Discharge.   ____ Take these medicines the morning of surgery with A SIP OF WATER:    1. None   ____ Fleet Enema (as directed)   __X__ Use CHG Soap (or wipes) as directed  ____ Use Benzoyl Peroxide Gel as instructed  ____ Use inhalers on the day of surgery  __X__ Stop metFORMIN (GLUCOPHAGE) 500 MG 2 days prior to surgery (last dose Sunday November 28)  ____ Take 1/2 of usual insulin dose the night before surgery. No insulin the morning          of surgery.   __X__ Stop aspirin as instructed by your provider.  __X__ Stop Anti-inflammatories such as naproxen sodium (ALEVE), Ibuprofen, Advil and or BC powders.    __X__ Stop supplements until after surgery. Omega-3 Fatty Acids (FISH OIL) 1000 MG andMelatonin 10 MG    __X__ Do not start any herbal supplements before your procedure.    If you have any questions regarding your pre-procedure instructions,  Please call Pre-admit Testing at [phone].

## 2019-12-28 ENCOUNTER — Encounter: Payer: Self-pay | Admitting: Dermatology

## 2020-01-04 ENCOUNTER — Other Ambulatory Visit: Payer: Self-pay

## 2020-01-04 ENCOUNTER — Other Ambulatory Visit
Admission: RE | Admit: 2020-01-04 | Discharge: 2020-01-04 | Disposition: A | Discharge status: Home / Self Care | Payer: Medicare Other | Source: Ambulatory Visit | Attending: Orthopedic Surgery | Admitting: Orthopedic Surgery

## 2020-01-04 DIAGNOSIS — Z20822 Contact with and (suspected) exposure to covid-19: Secondary | ICD-10-CM | POA: Insufficient documentation

## 2020-01-04 DIAGNOSIS — Z01812 Encounter for preprocedural laboratory examination: Secondary | ICD-10-CM | POA: Insufficient documentation

## 2020-01-04 LAB — CBC
HCT: 46.1 % (ref 39.0–52.0)
Hemoglobin: 15.6 g/dL (ref 13.0–17.0)
MCH: 30.4 pg (ref 26.0–34.0)
MCHC: 33.8 g/dL (ref 30.0–36.0)
MCV: 89.9 fL (ref 80.0–100.0)
Platelets: 482 10*3/uL — ABNORMAL HIGH (ref 150–400)
RBC: 5.13 MIL/uL (ref 4.22–5.81)
RDW: 13.2 % (ref 11.5–15.5)
WBC: 13.9 10*3/uL — ABNORMAL HIGH (ref 4.0–10.5)
nRBC: 0 % (ref 0.0–0.2)

## 2020-01-04 LAB — SARS CORONAVIRUS 2 (TAT 6-24 HRS): SARS Coronavirus 2: NEGATIVE

## 2020-01-05 MED ORDER — FAMOTIDINE 20 MG PO TABS: 20.0000 mg | ORAL_TABLET | Freq: Once | ORAL | Status: AC

## 2020-01-05 MED ORDER — ORAL CARE MOUTH RINSE: 15.0000 mL | Freq: Once | OROMUCOSAL | Status: AC

## 2020-01-05 MED ORDER — CHLORHEXIDINE GLUCONATE 0.12 % MT SOLN: 15.0000 mL | Freq: Once | OROMUCOSAL | Status: AC

## 2020-01-05 MED ORDER — CELECOXIB 200 MG PO CAPS: 400.0000 mg | ORAL_CAPSULE | Freq: Once | ORAL | Status: AC

## 2020-01-05 MED ORDER — SODIUM CHLORIDE 0.9 % IV SOLN: INTRAVENOUS | Status: DC

## 2020-01-06 ENCOUNTER — Encounter
Admission: RE | Disposition: A | Discharge status: Home / Self Care | Payer: Self-pay | Source: Home / Self Care | Attending: Orthopedic Surgery

## 2020-01-06 ENCOUNTER — Other Ambulatory Visit: Payer: Self-pay

## 2020-01-06 ENCOUNTER — Ambulatory Visit
Admission: RE | Admit: 2020-01-06 | Discharge: 2020-01-06 | Disposition: A | Discharge status: Home / Self Care | Payer: Medicare Other | Attending: Orthopedic Surgery | Admitting: Orthopedic Surgery

## 2020-01-06 ENCOUNTER — Ambulatory Visit: Payer: Medicare Other | Admitting: Anesthesiology

## 2020-01-06 ENCOUNTER — Encounter: Payer: Self-pay | Admitting: Orthopedic Surgery

## 2020-01-06 DIAGNOSIS — Z7984 Long term (current) use of oral hypoglycemic drugs: Secondary | ICD-10-CM | POA: Insufficient documentation

## 2020-01-06 DIAGNOSIS — G5601 Carpal tunnel syndrome, right upper limb: Secondary | ICD-10-CM | POA: Insufficient documentation

## 2020-01-06 DIAGNOSIS — Z87891 Personal history of nicotine dependence: Secondary | ICD-10-CM | POA: Insufficient documentation

## 2020-01-06 DIAGNOSIS — Z79899 Other long term (current) drug therapy: Secondary | ICD-10-CM | POA: Insufficient documentation

## 2020-01-06 DIAGNOSIS — Z9889 Other specified postprocedural states: Secondary | ICD-10-CM

## 2020-01-06 DIAGNOSIS — Z7982 Long term (current) use of aspirin: Secondary | ICD-10-CM | POA: Insufficient documentation

## 2020-01-06 HISTORY — PX: CARPAL TUNNEL RELEASE: SHX101

## 2020-01-06 LAB — GLUCOSE, CAPILLARY: Glucose-Capillary: 138 mg/dL — ABNORMAL HIGH (ref 70–99)

## 2020-01-06 SURGERY — CARPAL TUNNEL RELEASE
Anesthesia: General | Site: Wrist | Laterality: Right

## 2020-01-06 MED ORDER — ACETAMINOPHEN 10 MG/ML IV SOLN
INTRAVENOUS | Status: AC
  Filled 2020-01-06: qty 100

## 2020-01-06 MED ORDER — CHLORHEXIDINE GLUCONATE 0.12 % MT SOLN
OROMUCOSAL | Status: AC
  Administered 2020-01-06: 15 mL via OROMUCOSAL
  Filled 2020-01-06: qty 15

## 2020-01-06 MED ORDER — PHENYLEPHRINE HCL (PRESSORS) 10 MG/ML IV SOLN
INTRAVENOUS | Status: DC | PRN
  Administered 2020-01-06 (×2): 100 ug via INTRAVENOUS

## 2020-01-06 MED ORDER — ONDANSETRON HCL 4 MG/2ML IJ SOLN: 4.0000 mg | Freq: Four times a day (QID) | INTRAMUSCULAR | Status: DC | PRN

## 2020-01-06 MED ORDER — ACETAMINOPHEN 10 MG/ML IV SOLN
INTRAVENOUS | Status: DC | PRN
  Administered 2020-01-06: 1000 mg via INTRAVENOUS

## 2020-01-06 MED ORDER — ONDANSETRON HCL 4 MG PO TABS: 4.0000 mg | ORAL_TABLET | Freq: Four times a day (QID) | ORAL | Status: DC | PRN

## 2020-01-06 MED ORDER — LIDOCAINE HCL (CARDIAC) PF 100 MG/5ML IV SOSY
PREFILLED_SYRINGE | INTRAVENOUS | Status: DC | PRN
  Administered 2020-01-06: 100 mg via INTRAVENOUS

## 2020-01-06 MED ORDER — BUPIVACAINE HCL (PF) 0.25 % IJ SOLN
INTRAMUSCULAR | Status: AC
  Filled 2020-01-06: qty 30

## 2020-01-06 MED ORDER — ACETAMINOPHEN 325 MG PO TABS: 325.0000 mg | ORAL_TABLET | Freq: Four times a day (QID) | ORAL | Status: DC | PRN

## 2020-01-06 MED ORDER — HYDROCODONE-ACETAMINOPHEN 5-325 MG PO TABS: 1.0000 | ORAL_TABLET | ORAL | Status: DC | PRN

## 2020-01-06 MED ORDER — FENTANYL CITRATE (PF) 100 MCG/2ML IJ SOLN
INTRAMUSCULAR | Status: AC
  Filled 2020-01-06: qty 2

## 2020-01-06 MED ORDER — PROPOFOL 10 MG/ML IV BOLUS
INTRAVENOUS | Status: DC | PRN
  Administered 2020-01-06: 160 mg via INTRAVENOUS

## 2020-01-06 MED ORDER — MORPHINE SULFATE (PF) 2 MG/ML IV SOLN: 0.5000 mg | INTRAVENOUS | Status: DC | PRN

## 2020-01-06 MED ORDER — SEVOFLURANE IN SOLN
RESPIRATORY_TRACT | Status: AC
  Filled 2020-01-06: qty 250

## 2020-01-06 MED ORDER — FENTANYL CITRATE (PF) 100 MCG/2ML IJ SOLN
INTRAMUSCULAR | Status: DC | PRN
  Administered 2020-01-06: 25 ug via INTRAVENOUS
  Administered 2020-01-06: 50 ug via INTRAVENOUS
  Administered 2020-01-06: 25 ug via INTRAVENOUS

## 2020-01-06 MED ORDER — FAMOTIDINE 20 MG PO TABS
ORAL_TABLET | ORAL | Status: AC
  Administered 2020-01-06: 20 mg via ORAL
  Filled 2020-01-06: qty 1

## 2020-01-06 MED ORDER — BUPIVACAINE HCL (PF) 0.25 % IJ SOLN
INTRAMUSCULAR | Status: DC | PRN
  Administered 2020-01-06: 10 mL

## 2020-01-06 MED ORDER — ONDANSETRON HCL 4 MG/2ML IJ SOLN: 4.0000 mg | Freq: Once | INTRAMUSCULAR | Status: DC | PRN

## 2020-01-06 MED ORDER — DEXAMETHASONE SODIUM PHOSPHATE 10 MG/ML IJ SOLN
INTRAMUSCULAR | Status: DC | PRN
  Administered 2020-01-06: 10 mg via INTRAVENOUS

## 2020-01-06 MED ORDER — CELECOXIB 200 MG PO CAPS
ORAL_CAPSULE | ORAL | Status: AC
  Administered 2020-01-06: 400 mg via ORAL
  Filled 2020-01-06: qty 2

## 2020-01-06 MED ORDER — HYDROCODONE-ACETAMINOPHEN 5-325 MG PO TABS
1.0000 | ORAL_TABLET | ORAL | 0 refills | Status: DC | PRN
Start: 2020-01-06 — End: 2020-02-13

## 2020-01-06 MED ORDER — ONDANSETRON HCL 4 MG/2ML IJ SOLN
INTRAMUSCULAR | Status: DC | PRN
  Administered 2020-01-06: 4 mg via INTRAVENOUS

## 2020-01-06 MED ORDER — NEOMYCIN-POLYMYXIN B GU 40-200000 IR SOLN
Status: DC | PRN
  Administered 2020-01-06: 2 mL

## 2020-01-06 MED ORDER — FENTANYL CITRATE (PF) 100 MCG/2ML IJ SOLN: 25.0000 ug | INTRAMUSCULAR | Status: DC | PRN

## 2020-01-06 SURGICAL SUPPLY — 29 items
BNDG CMPR STD VLCR NS LF 5.8X3 (GAUZE/BANDAGES/DRESSINGS) ×1
BNDG ELASTIC 3X5.8 VLCR NS LF (GAUZE/BANDAGES/DRESSINGS) ×3 IMPLANT
BNDG ESMARK 4X12 TAN STRL LF (GAUZE/BANDAGES/DRESSINGS) ×3 IMPLANT
CANISTER SUCT 1200ML W/VALVE (MISCELLANEOUS) ×3 IMPLANT
CAST PADDING 3X4FT ST 30246 (SOFTGOODS) ×2
COVER WAND RF STERILE (DRAPES) ×3 IMPLANT
CUFF TOURN SGL QUICK 18X4 (TOURNIQUET CUFF) ×3 IMPLANT
DRSG DERMACEA 8X12 NADH (GAUZE/BANDAGES/DRESSINGS) ×3 IMPLANT
DURAPREP 26ML APPLICATOR (WOUND CARE) ×3 IMPLANT
ELECT CAUTERY BLADE 6.4 (BLADE) ×3 IMPLANT
ELECT REM PT RETURN 9FT ADLT (ELECTROSURGICAL) ×3
ELECTRODE REM PT RTRN 9FT ADLT (ELECTROSURGICAL) ×1 IMPLANT
GAUZE SPONGE 4X4 12PLY STRL (GAUZE/BANDAGES/DRESSINGS) ×3 IMPLANT
GLOVE BIOGEL M STRL SZ7.5 (GLOVE) ×3 IMPLANT
GLOVE INDICATOR 8.0 STRL GRN (GLOVE) ×3 IMPLANT
GOWN STRL REUS W/ TWL LRG LVL3 (GOWN DISPOSABLE) ×2 IMPLANT
GOWN STRL REUS W/TWL LRG LVL3 (GOWN DISPOSABLE) ×6
KIT TURNOVER KIT A (KITS) ×3 IMPLANT
MANIFOLD NEPTUNE II (INSTRUMENTS) ×3 IMPLANT
NS IRRIG 500ML POUR BTL (IV SOLUTION) ×3 IMPLANT
PACK EXTREMITY (MISCELLANEOUS) ×3 IMPLANT
PAD CAST CTTN 3X4 STRL (SOFTGOODS) ×1 IMPLANT
PADDING CAST COTTON 3X4 STRL (SOFTGOODS) ×1
SOL PREP PVP 2OZ (MISCELLANEOUS) ×3
SOLUTION PREP PVP 2OZ (MISCELLANEOUS) ×1 IMPLANT
SPLINT CAST 1 STEP 3X12 (MISCELLANEOUS) ×3 IMPLANT
STOCKINETTE 48X4 2 PLY STRL (GAUZE/BANDAGES/DRESSINGS) ×1 IMPLANT
STOCKINETTE STRL 4IN 9604848 (GAUZE/BANDAGES/DRESSINGS) ×3 IMPLANT
SUT ETHILON 5-0 FS-2 18 BLK (SUTURE) ×3 IMPLANT

## 2020-01-06 NOTE — Discharge Instructions (Signed)
Instructions after Hand / Wrist Surgery   [name], [name], M.D.  Dept. of Orthopaedics & Sports Medicine  [facility]  [address]  [name], [place]  [address]   Phone: [phone]   Fax: [phone]   DIET: . Drink plenty of non-alcoholic fluids & begin a light diet. . Resume your normal diet the day after surgery.  ACTIVITY:  . Keep the hand elevated above the level of the elbow. . Begin gently moving the fingers on a regular basis to avoid stiffness. . Avoid any heavy lifting, pushing, or pulling with the operative hand. . Do not drive or operate any equipment until instructed.  WOUND CARE:  . Keep the splint/bandage clean and dry.  . The splint and stitches will be removed in the office. . Continue to use the ice packs periodically to reduce pain and swelling. . You may bathe or shower after the stitches are removed at the first office visit following surgery.  MEDICATIONS: . You may resume your regular medications. . Please take the pain medication as prescribed. . Do not take pain medication on an empty stomach. . Do not drive or drink alcoholic beverages when taking pain medications.  CALL THE OFFICE FOR: . Temperature above 101 degrees . Excessive bleeding or drainage on the dressing. . Excessive swelling, coldness, or paleness of the fingers. . Persistent nausea and vomiting.  FOLLOW-UP:  . You should have an appointment to return to the office in 7-10 days after surgery.   REMEMBER: R.I.C.E. = Rest, Ice, Compression, Elevation !     [facility] Department Directory         [email or web address]       [email or web address]          Cardiology  Appointments: [place] - [phone] [place] - [phone]  Endocrinology  Appointments: [place] - [phone] [place] - [phone]  Gastroenterology  Appointments: [place] - [phone] [place] - [phone]        General  Surgery   Appointments: [place] - [phone]  Internal Medicine/Family Medicine  Appointments: [place] - [phone] [place] - [phone] [place] - [phone]  Metabolic and Weigh Loss Surgery  Appointments: [place] - [phone]        Neurology  Appointments: [place] - [phone] [place] - [phone]  Neurosurgery  Appointments: [place] - [phone]  Obstetrics & Gynecology  Appointments: [place] - [phone] [place] - [phone]        Pediatrics  Appointments: [place] - [phone] [place] - [phone]  Physiatry  Appointments: [place] -[phone]  Physical Therapy  Appointments: [place] - [phone] [place] - [phone]        Podiatry  Appointments: [place] - [phone] [place] - [phone]  Pulmonology  Appointments: [place] - [phone]  Rheumatology  Appointments:  - [phone]         Location: [facility]  [address] , [place]  [address]  [place] Location: [facility] [address] [place], [place]  [address]  [place] Location: [facility] [address] [place], [place]  [address]     AMBULATORY SURGERY  DISCHARGE INSTRUCTIONS   1) The drugs that you were given will stay in your system until tomorrow so for the next 24 hours you should not:  A) Drive an automobile B) Make any legal decisions C) Drink any alcoholic beverage   2) You may resume regular meals tomorrow.  Today it is better to start with liquids and gradually work up to solid foods.  You may eat anything you prefer, but it is better   to start with liquids, then soup and crackers, and gradually work up to solid foods.   3) Please notify your doctor immediately if you have any unusual bleeding, trouble breathing, redness and pain at the surgery site, drainage, fever, or pain not relieved by medication.    4) Additional Instructions:        Please contact your  physician with any problems or Same Day Surgery at [phone], Monday through Friday 6 am to 4 pm, or [place] at [place] Main number at [phone]. 

## 2020-01-06 NOTE — Anesthesia Postprocedure Evaluation (Signed)
Anesthesia Post Note  Patient: Brent [name].  Procedure(s) Performed: CARPAL TUNNEL RELEASE (Right Wrist)  Patient location during evaluation: PACU Anesthesia Type: General Level of consciousness: awake and alert Pain management: pain level controlled Vital Signs Assessment: post-procedure vital signs reviewed and stable Respiratory status: spontaneous breathing and respiratory function stable Cardiovascular status: stable Anesthetic complications: no   No complications documented.   Last Vitals:  Vitals:   01/06/20 1806 01/06/20 1810  BP:  (!) 130/57  Pulse: (!) 57 (!) 57  Resp:  17  Temp: 36.7 C   SpO2: 95% 95%    Last Pain:  Vitals:   01/06/20 1757  PainSc: 0-No pain                 Liberato Stansbery K

## 2020-01-06 NOTE — H&P (Signed)
ORTHOPAEDIC HISTORY & PHYSICAL Progress Notes Blanche Gallien, Florinda Marker., MD - 12/08/2019 3:15 PM EDT Chief Complaint: Chief Complaint  Patient presents with  . Knee Pain  Left knee degenerative arthrosis   Reason for Visit: The patient is a 74 y.o. male who presents today with his wife for evaluation of his left knee. He reports a long history of progressive left knee pain. He localizes most of the pain along the medial aspect of the knee. He reports some mild swelling, no locking, and no gross giving way of the area. The pain is aggravated by weightbearing activities. He is not currently using any ambulatory aids. He has not appreciated any significant improvement despite use of Tylenol, intra-articular corticosteroid injections, and activity modification. The knee pain limits his ability to ambulate long distances.   The patient also requested evaluation of his right hand. He has previous history of left carpal tunnel release performed by Dr. Rudene Christians. He reports numbness and tingling to the right hand. He has difficulty with buttoning buttons due to the numbness. He reports some mild decrease in his grip strength. He denies any neck pain or any radiation of pain in the arm. He is right-hand dominant.   Medications: Current Outpatient Medications  Medication Sig Dispense Refill  . acetaminophen (TYLENOL) 500 MG tablet Take 500 mg by mouth every 8 (eight) hours as needed 30 tablet 0  . aspirin 81 MG EC tablet Take 81 mg by mouth once daily.  Marland Kitchen atorvastatin (LIPITOR) 40 MG tablet Take 1 tablet by mouth once daily 90 tablet 1  . blood glucose diagnostic (CONTOUR NEXT STRIPS) test strip Use as directed. Check CBG's twice weekly. Dx: E11.49 50 each 11  . hydroCHLOROthiazide (HYDRODIURIL) 25 MG tablet Take 1 tablet (25 mg total) by mouth once daily 90 tablet 1  . lancets (MICROLET LANCET) as directed. Check CBG's once daily. Dx: E11.40 50 each 11  . lisinopriL (ZESTRIL) 40 MG tablet Take 1 tablet by  mouth once daily 90 tablet 0  . melatonin 10 mg Cap Take 10 mg by mouth nightly  . metFORMIN (GLUCOPHAGE) 500 MG tablet TAKE 2 TABLETS IN AM AND 1 TABLET IN PM WITH MEALS 270 tablet 0  . sildenafiL (VIAGRA) 100 MG tablet Take 1 tablet (100 mg total) by mouth once daily as needed for Erectile Dysfunction 10 tablet 1   No current facility-administered medications for this visit.   Allergies: No Known Allergies  Past Medical History: Past Medical History:  Diagnosis Date  . Chronic osteoarthritis (left knee - xray 04/2018) 03/11/2019  . Diabetes mellitus type 2, uncomplicated (CMS-HCC)  AODM with mild neuropathy (A1C 5.3%- 12/2012)  . Hyperlipidemia  LDL 103- 12/2012  . Internal hemorrhoids 03/22/2014  . Obesity  . Tubular adenoma of colon, unspecified 03/22/2014   Past Surgical History: Past Surgical History:  Procedure Laterality Date  . CHOLECYSTECTOMY 07/07/2018  Dr Lesli Albee  . COLONOSCOPY 03/22/2014  Dr. Dorthy Cooler @ [facility] - Tubular Adenomas, rpt 34yrs per [facility]  . COLONOSCOPY 08/05/2017  Tubular Adenoma: [facility] 08/2020  . ENDOSCOPIC CARPAL TUNNEL RELEASE Left 10/12/2015  Dr.Menz  . SPLENECTOMY  . UMBILICAL HERNIA REPAIR 76/22/6333  Dr Lesli Albee --- Debridment of abd wall   Social History: Social History   Socioeconomic History  . Marital status: Married  Spouse name: Judeen Hammans  . Number of children: 3  . Years of education: 12  . Highest education level: High school graduate  Occupational History  . Occupation: RetiredProgrammer, applications  Tobacco Use  . Smoking status: Former Smoker  Packs/day: 0.25  Years: 20.00  Pack years: 5.00  Types: Cigars  Quit date: 02/05/1993  Years since quitting: 26.8  . Smokeless tobacco: Never Used  . Tobacco comment: Smoke 10 cigars a day x 20 years, 1/2 cigar burned before smoking whole cigar  Vaping Use  . Vaping Use: Never used  Substance and Sexual Activity  . Alcohol use: No  Alcohol/week: 0.0 standard drinks  . Drug use: No  .  Sexual activity: Not Currently  Other Topics Concern  . Not on file  Social History Narrative  Marital Status- Widowed  Lives in [place]  Employment- Retired  Exercise hx- Merchandiser, retail multiple yards  Religious Affiliation- [employer]   Social Determinants of Health   Financial Resource Strain: Not on file  Food Insecurity: Not on file  Transportation Needs: Not on file  Physical Activity: Not on file  Stress: Not on file  Social Connections: Not on file  Housing Stability: Not on file   Family History: Family History  Problem Relation Age of Onset  . No Known Problems Mother  . Diabetes type II Father  . Kidney disease Father  . Heart disease Father  . Colon polyps Father  . No Known Problems Sister  . No Known Problems Sister   Review of Systems: A comprehensive 14 point ROS was performed, reviewed, and the pertinent orthopaedic findings are documented in the HPI.  Exam BP 142/84  Temp 36.5 C (97.7 F)  Ht 172.7 cm (5\' 8" )  Wt (!) 107 kg (236 lb)  BMI 35.88 kg/m   General:  Well-developed, well-nourished male seen in no acute distress.   HEENT:  Atraumatic, normocephalic. Pupils are equal and reactive to light. Extraocular motion is intact. Sclera are clear. Oropharynx is clear with moist mucosa.  Neck: Good range of motion. No tenderness to palpation. Spurling`s test and Lhermitte sign are negative.  Lungs:  Clear to auscultation bilaterally.  Cardiovascular:  Regular rate and rhythm. Normal S1, S2. No murmur . No appreciable gallops or rubs. Peripheral pulses are palpable.   Extremities:  Normal shoulder contour.  Good range of motion and stability of the shoulders, elbows, and wrists. Tinel`s test at the elbow is negative.  Right hand:  Tenderness: Negative Erythema: negative Swelling: negative Capillary Refill: normal Thenar atrophy: negative Intrinsic wasting: negative Grip strength: fair to good grip strength Pincer strength: fair to good pincer  strength Tinel`s test: positive Phalen`s test: equivocal Triggering: No gross triggering or locking of the digits Finkelstein`s test: negative Range of motion: Good range of motion of the digits  Left Knee:  Soft tissue swelling: none Effusion: none Erythema: none Crepitance: mild Tenderness: medial joint line Alignment: relative varus Mediolateral laxity: medial pseudolaxity Anterior drawer test:negative Lachman`s test: negative McMurray`s test: negative Atrophy: No significant atrophy.  Quadriceps tone was good. Range of Motion: 0/0/120 degrees   Neurologic:  Awake, alert , and oriented.  Sensory function is intact except for decreased discrimination to pinprick and light touch in a median nerve distribution. Motor strength is judged to be 5/5 except as noted above. No clonus or tremor.  Motor coordination is within normal limits.  Radiographs:  I ordered and interpreted standing AP, lateral, and sunrise radiographs of the left knee that were obtained in the office today. There is narrowing of the medial cartilage space with associated varus alignment. Osteophyte formation is noted. Subchondral sclerosis is noted. No evidence of fracture or dislocation.   Impression: Right  carpal tunnel syndrome Degenerative arthrosis of the left knee  Plan:  The findings were discussed in detail with the patient. The patient was given informational material on carpal tunnel release. Conservative treatment options were reviewed with the patient. We discussed the risks and benefits of surgical intervention. The usual perioperative course was also discussed in detail. The patient expressed understanding of the risks and benefits of surgical intervention and would like to proceed with plans for right carpal tunnel release.  We also had a lengthy discussion regarding the left knee. We reviewed conservative treatment options including glucosamine/chondroitin, topical NSAIDs, low impact exercise,  and viscosupplementation. We briefly discussed surgical intervention. The patient would like to consider a trial of viscosupplementation for the left knee. He will be notified when the Synvisc becomes available.  MEDICAL CLEARANCE: Per anesthesiology ACTIVITIES: As tolerated. WORK STATUS: Not applicable. THERAPY: None MEDICATIONS: Requested Prescriptions   No prescriptions requested or ordered in this encounter   FOLLOW-UP: Return for follow-up when the Synvisc becomes available.   Justo Hengel P. Holley Bouche., M.D.   Electronically signed by Lamar Benes., MD at 12/10/2019 7:21 AM EDT

## 2020-01-06 NOTE — H&P (Signed)
The patient has been re-examined, and the chart reviewed, and there have been no interval changes to the documented history and physical.    The risks, benefits, and alternatives have been discussed at length. The patient expressed understanding of the risks benefits and agreed with plans for surgical intervention.  Delmont Prosch P. Brinda Focht, [name] M.D.    

## 2020-01-06 NOTE — Transfer of Care (Signed)
Immediate Anesthesia Transfer of Care Note  Patient: Breckan Cafiero.  Procedure(s) Performed: CARPAL TUNNEL RELEASE (Right Wrist)  Patient Location: PACU  Anesthesia Type:General  Level of Consciousness: awake, alert  and oriented  Airway & Oxygen Therapy: Patient Spontanous Breathing and Patient connected to face mask oxygen  Post-op Assessment: Report given to RN and Post -op Vital signs reviewed and stable  Post vital signs: Reviewed and stable  Last Vitals:  Vitals Value Taken Time  BP 120/56 01/06/20 1743  Temp    Pulse 58 01/06/20 1743  Resp    SpO2 98 % 01/06/20 1743  Vitals shown include unvalidated device data.  Last Pain:  Vitals:   01/06/20 1622  PainSc: 0-No pain         Complications: No complications documented.

## 2020-01-06 NOTE — Anesthesia Preprocedure Evaluation (Signed)
Anesthesia Evaluation  Patient identified by MRN, date of birth, ID band Patient awake    Reviewed: Allergy & Precautions, NPO status , Patient's Chart, lab work & pertinent test results  History of Anesthesia Complications Negative for: history of anesthetic complications  Airway Mallampati: II       Dental   Pulmonary neg sleep apnea, neg COPD, Not current smoker, former smoker,           Cardiovascular hypertension, Pt. on medications (-) Past MI and (-) CHF (-) dysrhythmias (-) Valvular Problems/Murmurs     Neuro/Psych neg Seizures    GI/Hepatic Neg liver ROS, PUD, GERD  Medicated,  Endo/Other  diabetes, Type 2, Oral Hypoglycemic Agents  Renal/GU negative Renal ROS     Musculoskeletal   Abdominal   Peds  Hematology   Anesthesia Other Findings   Reproductive/Obstetrics                             Anesthesia Physical Anesthesia Plan  ASA: III  Anesthesia Plan: General   Post-op Pain Management:    Induction: Intravenous  PONV Risk Score and Plan: 2 and Dexamethasone and Ondansetron  Airway Management Planned: LMA  Additional Equipment:   Intra-op Plan:   Post-operative Plan:   Informed Consent: I have reviewed the patients History and Physical, chart, labs and discussed the procedure including the risks, benefits and alternatives for the proposed anesthesia with the patient or authorized representative who has indicated his/her understanding and acceptance.       Plan Discussed with:   Anesthesia Plan Comments:         Anesthesia Quick Evaluation

## 2020-01-06 NOTE — Anesthesia Procedure Notes (Signed)
Procedure Name: LMA Insertion Date/Time: 01/06/2020 5:05 PM Performed by: Nelda Marseille, CRNA Pre-anesthesia Checklist: Patient identified, Patient being monitored, Timeout performed, Emergency Drugs available and Suction available Patient Re-evaluated:Patient Re-evaluated prior to induction Oxygen Delivery Method: Circle system utilized Preoxygenation: Pre-oxygenation with 100% oxygen Induction Type: IV induction Ventilation: Mask ventilation without difficulty LMA: LMA inserted LMA Size: 4.0 Tube type: Oral Number of attempts: 1 Placement Confirmation: positive ETCO2 and breath sounds checked- equal and bilateral Tube secured with: Tape Dental Injury: Teeth and Oropharynx as per pre-operative assessment

## 2020-01-06 NOTE — Op Note (Signed)
OPERATIVE NOTE  DATE OF SURGERY:  01/06/2020  PATIENT NAME:  Brent [name].   DOB: 1946/01/30  MRN: [id number]  PRE-OPERATIVE DIAGNOSIS: Right carpal tunnel syndrome  POST-OPERATIVE DIAGNOSIS:  Same  PROCEDURE:  Right carpal tunnel release  SURGEON:  Marciano Sequin. M.D.  ANESTHESIA: general  ESTIMATED BLOOD LOSS: none  FLUIDS REPLACED: 500 mL of crystalloid  TOURNIQUET TIME: 28 minutes  DRAINS: None  INDICATIONS FOR SURGERY: Brent [name]. is a 74 y.o. year old male with a long history of numbness and paresthesias to the right hand. The patient had not seen any significant improvement despite conservative nonsurgical intervention. After discussion of the risks and benefits of surgical intervention, the patient expressed understanding of the risks benefits and agree with plans for carpal tunnel release.   PROCEDURE IN DETAIL: The patient was brought into the operating room and after adequate general anesthesia, a tourniquet was placed on the patient's right upper arm.The right hand and arm were prepped with alcohol and Chloroprepprep and draped in the usual sterile fashion. A "time-out" was performed as per usual protocol. The hand and forearm were exsanguinated using an Esmarch and the tourniquet was inflated to 250 mmHg. Loupe magnification was used throughout the procedure. An incision was made just ulnar to the thenar palmar crease. Dissection was carried down through the palmar fascia to the transverse carpal ligament. The transverse carpal ligament was sharply incised, taking care to protect the underlying structures with the carpal tunnel. Complete release of the transverse carpal ligament was achieved. There was no evidence of ganglion cyst or lipoma within the carpal tunnel. The wound was irrigated with copious amounts of normal saline with antibiotic solution. The skin was then re-approximated with interrupted sutures of #5-0 nylon. A sterile dressing was applied  followed by application of a volar splint. The tourniquet was deflated with a total tourniquet time of 28 minutes.  The patient tolerated the procedure well and was transported to the PACU in stable condition.  Gladis Soley P. Holley Bouche., M.D.

## 2020-01-11 LAB — GLUCOSE, CAPILLARY: Glucose-Capillary: 170 mg/dL — ABNORMAL HIGH (ref 70–99)

## 2020-02-12 ENCOUNTER — Encounter: Payer: Self-pay | Admitting: Emergency Medicine

## 2020-02-12 ENCOUNTER — Emergency Department: Payer: Medicare Other

## 2020-02-12 ENCOUNTER — Inpatient Hospital Stay
Admission: EM | Admit: 2020-02-12 | Discharge: 2020-02-18 | DRG: 177 | Disposition: A | Discharge status: Home / Self Care | Payer: Medicare Other | Attending: Internal Medicine | Admitting: Internal Medicine

## 2020-02-12 ENCOUNTER — Other Ambulatory Visit: Payer: Self-pay

## 2020-02-12 DIAGNOSIS — Z87891 Personal history of nicotine dependence: Secondary | ICD-10-CM

## 2020-02-12 DIAGNOSIS — E1165 Type 2 diabetes mellitus with hyperglycemia: Secondary | ICD-10-CM | POA: Diagnosis present

## 2020-02-12 DIAGNOSIS — E875 Hyperkalemia: Secondary | ICD-10-CM | POA: Diagnosis present

## 2020-02-12 DIAGNOSIS — E1159 Type 2 diabetes mellitus with other circulatory complications: Secondary | ICD-10-CM | POA: Diagnosis not present

## 2020-02-12 DIAGNOSIS — D849 Immunodeficiency, unspecified: Secondary | ICD-10-CM | POA: Diagnosis present

## 2020-02-12 DIAGNOSIS — M19049 Primary osteoarthritis, unspecified hand: Secondary | ICD-10-CM | POA: Diagnosis present

## 2020-02-12 DIAGNOSIS — Z7982 Long term (current) use of aspirin: Secondary | ICD-10-CM | POA: Diagnosis not present

## 2020-02-12 DIAGNOSIS — N179 Acute kidney failure, unspecified: Secondary | ICD-10-CM | POA: Diagnosis not present

## 2020-02-12 DIAGNOSIS — K219 Gastro-esophageal reflux disease without esophagitis: Secondary | ICD-10-CM | POA: Diagnosis present

## 2020-02-12 DIAGNOSIS — J9601 Acute respiratory failure with hypoxia: Secondary | ICD-10-CM | POA: Diagnosis present

## 2020-02-12 DIAGNOSIS — Z7984 Long term (current) use of oral hypoglycemic drugs: Secondary | ICD-10-CM

## 2020-02-12 DIAGNOSIS — Z79899 Other long term (current) drug therapy: Secondary | ICD-10-CM | POA: Diagnosis not present

## 2020-02-12 DIAGNOSIS — R0602 Shortness of breath: Secondary | ICD-10-CM | POA: Diagnosis present

## 2020-02-12 DIAGNOSIS — Z6836 Body mass index (BMI) 36.0-36.9, adult: Secondary | ICD-10-CM

## 2020-02-12 DIAGNOSIS — I152 Hypertension secondary to endocrine disorders: Secondary | ICD-10-CM | POA: Diagnosis not present

## 2020-02-12 DIAGNOSIS — I10 Essential (primary) hypertension: Secondary | ICD-10-CM | POA: Diagnosis present

## 2020-02-12 DIAGNOSIS — U071 COVID-19: Secondary | ICD-10-CM | POA: Diagnosis present

## 2020-02-12 DIAGNOSIS — D75838 Other thrombocytosis: Secondary | ICD-10-CM | POA: Diagnosis present

## 2020-02-12 DIAGNOSIS — Z9081 Acquired absence of spleen: Secondary | ICD-10-CM

## 2020-02-12 DIAGNOSIS — E785 Hyperlipidemia, unspecified: Secondary | ICD-10-CM | POA: Diagnosis present

## 2020-02-12 DIAGNOSIS — E1169 Type 2 diabetes mellitus with other specified complication: Secondary | ICD-10-CM | POA: Diagnosis not present

## 2020-02-12 DIAGNOSIS — J1282 Pneumonia due to coronavirus disease 2019: Secondary | ICD-10-CM | POA: Diagnosis present

## 2020-02-12 DIAGNOSIS — D75839 Thrombocytosis, unspecified: Secondary | ICD-10-CM | POA: Diagnosis not present

## 2020-02-12 DIAGNOSIS — E1140 Type 2 diabetes mellitus with diabetic neuropathy, unspecified: Secondary | ICD-10-CM

## 2020-02-12 DIAGNOSIS — D580 Hereditary spherocytosis: Secondary | ICD-10-CM | POA: Diagnosis present

## 2020-02-12 DIAGNOSIS — E669 Obesity, unspecified: Secondary | ICD-10-CM | POA: Diagnosis not present

## 2020-02-12 LAB — BASIC METABOLIC PANEL
Anion gap: 12 (ref 5–15)
BUN: 44 mg/dL — ABNORMAL HIGH (ref 8–23)
CO2: 22 mmol/L (ref 22–32)
Calcium: 9.7 mg/dL (ref 8.9–10.3)
Chloride: 99 mmol/L (ref 98–111)
Creatinine, Ser: 1.38 mg/dL — ABNORMAL HIGH (ref 0.61–1.24)
GFR, Estimated: 54 mL/min — ABNORMAL LOW (ref >60)
Glucose, Bld: 442 mg/dL — ABNORMAL HIGH (ref 70–99)
Potassium: 5.1 mmol/L (ref 3.5–5.1)
Sodium: 133 mmol/L — ABNORMAL LOW (ref 135–145)

## 2020-02-12 LAB — ABO/RH: ABO/RH(D): A POS

## 2020-02-12 LAB — TYPE AND SCREEN
ABO/RH(D): A POS
Antibody Screen: NEGATIVE

## 2020-02-12 LAB — CBG MONITORING, ED: Glucose-Capillary: 390 mg/dL — ABNORMAL HIGH (ref 70–99)

## 2020-02-12 LAB — LACTATE DEHYDROGENASE: LDH: 210 U/L — ABNORMAL HIGH (ref 98–192)

## 2020-02-12 LAB — FIBRINOGEN: Fibrinogen: >750 mg/dL — ABNORMAL HIGH (ref 210–475)

## 2020-02-12 LAB — CBC
HCT: 43 % (ref 39.0–52.0)
Hemoglobin: 14.5 g/dL (ref 13.0–17.0)
MCH: 29.9 pg (ref 26.0–34.0)
MCHC: 33.7 g/dL (ref 30.0–36.0)
MCV: 88.7 fL (ref 80.0–100.0)
Platelets: 907 10*3/uL (ref 150–400)
RBC: 4.85 MIL/uL (ref 4.22–5.81)
RDW: 12.3 % (ref 11.5–15.5)
WBC: 21 10*3/uL — ABNORMAL HIGH (ref 4.0–10.5)
nRBC: 0.3 % — ABNORMAL HIGH (ref 0.0–0.2)

## 2020-02-12 LAB — TROPONIN I (HIGH SENSITIVITY)
Troponin I (High Sensitivity): 23 ng/L — ABNORMAL HIGH (ref <18)
Troponin I (High Sensitivity): 26 ng/L — ABNORMAL HIGH (ref <18)

## 2020-02-12 LAB — PROCALCITONIN: Procalcitonin: 0.14 ng/mL

## 2020-02-12 LAB — BRAIN NATRIURETIC PEPTIDE: B Natriuretic Peptide: 88.3 pg/mL (ref 0.0–100.0)

## 2020-02-12 LAB — FERRITIN: Ferritin: 863 ng/mL — ABNORMAL HIGH (ref 24–336)

## 2020-02-12 MED ORDER — FISH OIL 1000 MG PO CAPS: 1000.0000 mg | ORAL_CAPSULE | Freq: Every day | ORAL | Status: DC

## 2020-02-12 MED ORDER — OMEGA-3-ACID ETHYL ESTERS 1 G PO CAPS
1.0000 g | ORAL_CAPSULE | Freq: Every day | ORAL | Status: DC
  Administered 2020-02-13 – 2020-02-18 (×6): 1 g via ORAL
  Filled 2020-02-12 (×6): qty 1

## 2020-02-12 MED ORDER — SODIUM CHLORIDE 0.9 % IV SOLN
100.0000 mg | Freq: Every day | INTRAVENOUS | Status: AC
  Administered 2020-02-13 – 2020-02-16 (×4): 100 mg via INTRAVENOUS
  Filled 2020-02-12 (×4): qty 20

## 2020-02-12 MED ORDER — SODIUM CHLORIDE 0.9 % IV SOLN: 200.0000 mg | Freq: Once | INTRAVENOUS | Status: DC

## 2020-02-12 MED ORDER — SODIUM CHLORIDE 0.9 % IV SOLN
500.0000 mg | Freq: Once | INTRAVENOUS | Status: AC
  Administered 2020-02-12: 500 mg via INTRAVENOUS
  Filled 2020-02-12: qty 500

## 2020-02-12 MED ORDER — HYDROCODONE-ACETAMINOPHEN 5-325 MG PO TABS: 1.0000 | ORAL_TABLET | ORAL | Status: DC | PRN

## 2020-02-12 MED ORDER — INSULIN GLARGINE 100 UNIT/ML ~~LOC~~ SOLN
10.0000 [IU] | Freq: Every day | SUBCUTANEOUS | Status: DC
  Administered 2020-02-12: 10 [IU] via SUBCUTANEOUS
  Filled 2020-02-12 (×2): qty 0.1

## 2020-02-12 MED ORDER — INSULIN ASPART 100 UNIT/ML ~~LOC~~ SOLN
0.0000 [IU] | Freq: Three times a day (TID) | SUBCUTANEOUS | Status: DC
  Administered 2020-02-13: 15 [IU] via SUBCUTANEOUS
  Filled 2020-02-12: qty 1

## 2020-02-12 MED ORDER — ONDANSETRON HCL 4 MG/2ML IJ SOLN: 4.0000 mg | Freq: Four times a day (QID) | INTRAMUSCULAR | Status: DC | PRN

## 2020-02-12 MED ORDER — IPRATROPIUM-ALBUTEROL 20-100 MCG/ACT IN AERS
1.0000 | INHALATION_SPRAY | Freq: Four times a day (QID) | RESPIRATORY_TRACT | Status: DC
  Administered 2020-02-12 – 2020-02-18 (×19): 1 via RESPIRATORY_TRACT
  Filled 2020-02-12 (×3): qty 4

## 2020-02-12 MED ORDER — INSULIN ASPART 100 UNIT/ML ~~LOC~~ SOLN
5.0000 [IU] | Freq: Three times a day (TID) | SUBCUTANEOUS | Status: DC
  Administered 2020-02-13: 5 [IU] via SUBCUTANEOUS

## 2020-02-12 MED ORDER — METFORMIN HCL 500 MG PO TABS: 500.0000 mg | ORAL_TABLET | ORAL | Status: DC

## 2020-02-12 MED ORDER — DEXAMETHASONE SODIUM PHOSPHATE 10 MG/ML IJ SOLN
10.0000 mg | Freq: Once | INTRAMUSCULAR | Status: AC
  Administered 2020-02-12: 10 mg via INTRAVENOUS
  Filled 2020-02-12: qty 1

## 2020-02-12 MED ORDER — ATORVASTATIN CALCIUM 20 MG PO TABS
40.0000 mg | ORAL_TABLET | Freq: Every day | ORAL | Status: DC
  Administered 2020-02-12 – 2020-02-17 (×6): 40 mg via ORAL
  Filled 2020-02-12 (×6): qty 2

## 2020-02-12 MED ORDER — HYDROCHLOROTHIAZIDE 25 MG PO TABS
25.0000 mg | ORAL_TABLET | Freq: Every day | ORAL | Status: DC
Start: 2020-02-13 — End: 2020-02-18
  Administered 2020-02-13 – 2020-02-17 (×5): 25 mg via ORAL
  Filled 2020-02-12 (×6): qty 1

## 2020-02-12 MED ORDER — SODIUM CHLORIDE 0.9 % IV SOLN
200.0000 mg | Freq: Once | INTRAVENOUS | Status: AC
  Administered 2020-02-12: 200 mg via INTRAVENOUS
  Filled 2020-02-12: qty 200

## 2020-02-12 MED ORDER — PREDNISONE 20 MG PO TABS: 50.0000 mg | ORAL_TABLET | Freq: Every day | ORAL | Status: DC

## 2020-02-12 MED ORDER — ACETAMINOPHEN 325 MG PO TABS: 650.0000 mg | ORAL_TABLET | Freq: Four times a day (QID) | ORAL | Status: DC | PRN

## 2020-02-12 MED ORDER — ALBUTEROL SULFATE HFA 108 (90 BASE) MCG/ACT IN AERS
2.0000 | INHALATION_SPRAY | RESPIRATORY_TRACT | Status: DC | PRN
  Administered 2020-02-15: 2 via RESPIRATORY_TRACT
  Filled 2020-02-12: qty 6.7

## 2020-02-12 MED ORDER — METHYLPREDNISOLONE SODIUM SUCC 125 MG IJ SOLR
1.0000 mg/kg | Freq: Two times a day (BID) | INTRAMUSCULAR | Status: DC
  Administered 2020-02-12 – 2020-02-13 (×2): 107.5 mg via INTRAVENOUS
  Filled 2020-02-12 (×2): qty 2

## 2020-02-12 MED ORDER — GUAIFENESIN-DM 100-10 MG/5ML PO SYRP
10.0000 mL | ORAL_SOLUTION | ORAL | Status: DC | PRN
  Administered 2020-02-15: 10 mL via ORAL
  Filled 2020-02-12 (×2): qty 10

## 2020-02-12 MED ORDER — ASPIRIN EC 81 MG PO TBEC
81.0000 mg | DELAYED_RELEASE_TABLET | Freq: Every day | ORAL | Status: DC
Start: 2020-02-12 — End: 2020-02-18
  Administered 2020-02-12 – 2020-02-18 (×7): 81 mg via ORAL
  Filled 2020-02-12 (×7): qty 1

## 2020-02-12 MED ORDER — ONDANSETRON HCL 4 MG PO TABS: 4.0000 mg | ORAL_TABLET | Freq: Four times a day (QID) | ORAL | Status: DC | PRN

## 2020-02-12 MED ORDER — SODIUM CHLORIDE 0.9 % IV SOLN: 100.0000 mg | Freq: Every day | INTRAVENOUS | Status: DC

## 2020-02-12 MED ORDER — LISINOPRIL 20 MG PO TABS
40.0000 mg | ORAL_TABLET | ORAL | Status: DC
  Administered 2020-02-13 – 2020-02-17 (×5): 40 mg via ORAL
  Filled 2020-02-12: qty 2
  Filled 2020-02-12 (×3): qty 4
  Filled 2020-02-12 (×2): qty 2

## 2020-02-12 MED ORDER — ENOXAPARIN SODIUM 40 MG/0.4ML ~~LOC~~ SOLN: 40.0000 mg | SUBCUTANEOUS | Status: DC

## 2020-02-12 MED ORDER — MELATONIN 5 MG PO TABS
10.0000 mg | ORAL_TABLET | Freq: Every day | ORAL | Status: DC
  Administered 2020-02-12 – 2020-02-15 (×4): 10 mg via ORAL
  Filled 2020-02-12 (×5): qty 2

## 2020-02-12 MED ORDER — INSULIN ASPART 100 UNIT/ML ~~LOC~~ SOLN
0.0000 [IU] | Freq: Every day | SUBCUTANEOUS | Status: DC
  Administered 2020-02-12: 3 [IU] via SUBCUTANEOUS
  Filled 2020-02-12 (×2): qty 1

## 2020-02-12 MED ORDER — ENOXAPARIN SODIUM 60 MG/0.6ML ~~LOC~~ SOLN
0.5000 mg/kg | SUBCUTANEOUS | Status: DC
  Administered 2020-02-12 – 2020-02-17 (×5): 55 mg via SUBCUTANEOUS
  Filled 2020-02-12 (×5): qty 0.6

## 2020-02-12 NOTE — Consult Note (Signed)
Remdesivir - Pharmacy Brief Note   O:  ALT: NNL CXR: Complete / results pending SpO2: 93% on 3L [place]   A/P:  Remdesivir 200 mg IVPB once followed by 100 mg IVPB daily x 4 days.   Lorna Dibble, [facility] 02/12/2020 6:17 PM

## 2020-02-12 NOTE — ED Triage Notes (Signed)
Pt to ED from Dr. Ammie Ferrier office for COVID pneumonia. Pt was diagnosed with COVID 16 days ago. Went to see his PCP for worsening cough and O2 sats dropped with ambulation. Pt is currently on 3 liter of oxygen in triage with sats of 91%. Pt is currently in NAD.

## 2020-02-12 NOTE — ED Provider Notes (Signed)
[facility] Emergency Department Provider Note  ____________________________________________   I have reviewed the triage vital signs and the nursing notes.   HISTORY  Chief Complaint Shortness of Breath   History limited by: Not Limited   HPI Brent [name]. is a 75 y.o. male who presents to the emergency department today because of concerns for shortness of breath and hypoxemia.  The patient was diagnosed with COVID roughly 2 weeks ago.  Initial symptoms consisted of congestion and upper respiratory disease.  The patient states that he did start to feel better however couple days ago started having increased shortness of breath.  He has had a some slight cough although has been not been nonproductive.  He denies any associated chest pain.  Denies any recent fevers.  Went to [place] office today and was found to be hypoxic with ambulation. Denies history of previous lung disease.   Records reviewed. Per medical record review patient has a history of DM, HLD, HTN.  Past Medical History:  Diagnosis Date  . Arthritis    FINGERS  . Diabetes mellitus without complication ([place])   . GERD (gastroesophageal reflux disease)    RARE  . Hyperlipidemia   . Hypertension   . Spherocytosis, hereditary ([place])    HAD SPLEEN REMOVED DUE TO THIS IN 7TH GRADE    Patient Active Problem List   Diagnosis Date Noted  . Chronic osteoarthritis 03/11/2019  . Dehydration 07/16/2018  . Elevated liver enzymes   . Abnormal CT of the abdomen   . Duodenal ulceration   . Choledocholithiasis 05/15/2018  . Psychophysiological insomnia 05/06/2018  . Other male erectile dysfunction 10/08/2017  . History of leukocytosis 03/05/2016  . Essential hypertension 10/15/2014  . Non morbid obesity due to excess calories 06/26/2013  . Pure hypercholesterolemia 06/26/2013  . Type 2 diabetes mellitus with diabetic neuropathy, without long-term current use of insulin ([place]) 06/26/2013  . Mixed  hyperlipidemia 06/26/2013    Past Surgical History:  Procedure Laterality Date  . CARPAL TUNNEL RELEASE Left 10/2015  . CARPAL TUNNEL RELEASE Right 01/06/2020   Procedure: CARPAL TUNNEL RELEASE;  Surgeon: Dereck Leep, MD;  Location: [facility] ORS;  Service: Orthopedics;  Laterality: Right;  . CHOLECYSTECTOMY N/A 07/07/2018   Procedure: LAPAROSCOPIC CHOLECYSTECTOMY;  Surgeon: [name], MD;  Location: [facility] ORS;  Service: General;  Laterality: N/A;  . COLONOSCOPY    . COLONOSCOPY WITH PROPOFOL N/A 08/05/2017   Procedure: COLONOSCOPY WITH PROPOFOL;  Surgeon: Manya Silvas, MD;  Location: [facility] ENDOSCOPY;  Service: Endoscopy;  Laterality: N/A;  . DEBRIDEMENT OF ABDOMINAL WALL ABSCESS N/A 07/24/2019   Procedure: DEBRIDEMENT OF ABDOMINAL WALL ABSCESS;  Surgeon: [name], MD;  Location: [facility] ORS;  Service: General;  Laterality: N/A;  . ENDOSCOPIC RETROGRADE CHOLANGIOPANCREATOGRAPHY (ERCP) WITH PROPOFOL N/A 05/16/2018   Procedure: ENDOSCOPIC RETROGRADE CHOLANGIOPANCREATOGRAPHY (ERCP) WITH PROPOFOL;  Surgeon: Lucilla Lame, MD;  Location: [facility] ENDOSCOPY;  Service: Endoscopy;  Laterality: N/A;  . SPLENECTOMY    . UMBILICAL HERNIA REPAIR N/A 07/24/2019   Procedure: HERNIA REPAIR UMBILICAL ADULT;  Surgeon: [name], MD;  Location: [facility] ORS;  Service: General;  Laterality: N/A;    Prior to Admission medications   Medication Sig Start Date End Date Taking? Authorizing Provider  acetaminophen (TYLENOL) 500 MG tablet Take 500 mg by mouth in the morning. Patient not taking: Reported on 12/23/2019    [provider]  aspirin EC 81 MG tablet Take 81 mg by mouth daily.     [provider]  atorvastatin (LIPITOR) 40 MG tablet Take 40 mg by mouth at bedtime.     [provider]  hydrochlorothiazide (HYDRODIURIL) 25 MG tablet Take 25 mg by mouth daily. 07/14/19   [provider]  HYDROcodone-acetaminophen (NORCO) 5-325 MG tablet Take 1-2 tablets  by mouth every 4 (four) hours as needed for moderate pain. 01/06/20   Hooten, Laurice Record, MD  lisinopril (PRINIVIL,ZESTRIL) 40 MG tablet Take 40 mg by mouth every morning.  05/01/15   [provider]  Melatonin 10 MG TABS Take 10 mg by mouth at bedtime.    [provider]  metFORMIN (GLUCOPHAGE) 500 MG tablet Take 500-1,000 mg by mouth See admin instructions. Take 1000 mg by mouth in the morning and 500 mg at night 02/28/18   [provider]  naproxen sodium (ALEVE) 220 MG tablet Take 220-440 mg by mouth daily as needed (pain).     [provider]  Omega-3 Fatty Acids (FISH OIL) 1000 MG CAPS Take 1,000 mg by mouth daily.    [provider]  sildenafil (VIAGRA) 100 MG tablet Take 100 mg by mouth at bedtime as needed for erectile dysfunction.     [provider]    Allergies Patient has no known allergies.  No family history on file.  Social History Social History   Tobacco Use  . Smoking status: Former Smoker    Packs/day: 1.00    Types: Cigarettes    Quit date: 07/01/1988    Years since quitting: 31.6  . Smokeless tobacco: Never Used  Vaping Use  . Vaping Use: Never used  Substance Use Topics  . Alcohol use: Not Currently  . Drug use: Never    Review of Systems Constitutional: No fever/chills Eyes: No visual changes. ENT: Positive for congestion. Cardiovascular: Denies chest pain. Respiratory: Positive for shortness of breath. Positive for cough. Gastrointestinal: No abdominal pain.  No nausea, no vomiting.  No diarrhea.   Genitourinary: Negative for dysuria. Musculoskeletal: Negative for back pain. Skin: Negative for rash. Neurological: Negative for headaches, focal weakness or numbness.  ____________________________________________   PHYSICAL EXAM:  VITAL SIGNS: ED Triage Vitals  Enc Vitals Group     BP 02/12/20 1431 138/70     Pulse Rate 02/12/20 1431 85     Resp 02/12/20 1431 18     Temp 02/12/20 1431 98.5 F  (36.9 C)     Temp src --      SpO2 02/12/20 1431 (!) 89 %     Weight 02/12/20 1502 236 lb 15.9 oz (107.5 kg)     Height 02/12/20 1502 5\' 8"  (1.727 m)     Head Circumference --      Peak Flow --      Pain Score 02/12/20 1502 0   Constitutional: Alert and oriented.  Eyes: Conjunctivae are normal.  ENT      Head: Normocephalic and atraumatic.      Nose: No congestion/rhinnorhea.      Mouth/Throat: Mucous membranes are moist.      Neck: No stridor. Hematological/Lymphatic/Immunilogical: No cervical lymphadenopathy. Cardiovascular: Normal rate, regular rhythm.  No murmurs, rubs, or gallops.  Respiratory: Normal respiratory effort without tachypnea nor retractions. Breath sounds are clear and equal bilaterally. No wheezes/rales/rhonchi. Gastrointestinal: Soft and non tender. No rebound. No guarding.  Genitourinary: Deferred Musculoskeletal: Normal range of motion in all extremities. No lower extremity edema. Neurologic:  Normal speech and language. No gross focal neurologic deficits are appreciated.  Skin:  Skin is warm, dry  and intact. No rash noted. Psychiatric: Mood and affect are normal. Speech and behavior are normal. Patient exhibits appropriate insight and judgment.  ____________________________________________    LABS (pertinent positives/negatives)  CBC wbc 21.0, hgb 14.5, plt 907 BMP na 133, k 5.1, glu 442, cr 1.38 Pro calcitonin 0.14 ____________________________________________   EKG  I, Nance Pear, attending physician, personally viewed and interpreted this EKG  EKG Time: 0812 Rate: 96 Rhythm: atrial fibrillation Axis: left axis deviation Intervals: qtc 469 QRS: RBBB ST changes: no st elevation Impression: abnormal ekg  ____________________________________________    RADIOLOGY  CXR Bilateral hazy airspace  opacities.  ____________________________________________   PROCEDURES  Procedures  ____________________________________________   INITIAL IMPRESSION / ASSESSMENT AND PLAN / ED COURSE  Pertinent labs & imaging results that were available during my care of the patient were reviewed by me and considered in my medical decision making (see chart for details).   Patient presented to the emergency department today because of concerns for shortness of breath and hypoxia. Patient was diagnosed with covid recently. X-ray here does show bilateral hazy opacities consistent with covid. However patient had leukocytosis of 21 and slightly elevated procalcitonin. Because of this will give patient does of IV abx in case there is a superimposed bacterial pneumonia. Discussed findings with patient. Will plan on admission to the hospitalist service.   ___________________________________________   FINAL CLINICAL IMPRESSION(S) / ED DIAGNOSES  Final diagnoses:  COVID-19     Note: This dictation was prepared with Dragon dictation. Any transcriptional errors that result from this process are unintentional     Nance Pear, MD 02/12/20 1946

## 2020-02-12 NOTE — ED Notes (Signed)
ED Provider at bedside. 

## 2020-02-12 NOTE — H&P (Signed)
History and Physical    Brent [name]. BMW:[id number] DOB: Apr 12, 1945 DOA: 02/12/2020  PCP: [name], MD (Confirm with patient/family/NH records and if not entered, this has to be entered at [facility] point of entry) Patient coming from: Home  I have personally briefly reviewed patient's old medical records in [place]  Chief Complaint: Cough, SOB  HPI: Brent [name]. is a 75 y.o. 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  Patient started to have dry cough about 2 weeks ago, denied any loss of taste, no muscle aching no diarrhea.  He went to tested positive forOVID-19 on 01/29/2020.  He did not receive the monoclonal antibody infusion or p.o. treatment for the Covid infection.  His symptoms became worse 3 days ago, since then he developed increasing shortness of breath with minimal activity, no fever chills, no chest pain or palpitation, cough remained to be dry.  He went to see PCP today and was found pulse ox level was low and sent to the ED.  He received Moderna vaccine x2 last year.  ED Course: Patient was found to have hypoxia, stabilized with 3 L and 91%.  Chest x-ray showed diffuse bilateral multifocal pneumonia.  Blood work glucose 440, WBC 21.  Review of Systems: As per HPI otherwise 14 point review of systems negative.    Past Medical History:  Diagnosis Date  . Arthritis    FINGERS  . Diabetes mellitus without complication ([place])   . GERD (gastroesophageal reflux disease)    RARE  . Hyperlipidemia   . Hypertension   . Spherocytosis, hereditary ([place])    HAD SPLEEN REMOVED DUE TO THIS IN 7TH GRADE    Past Surgical History:  Procedure Laterality Date  . CARPAL TUNNEL RELEASE Left 10/2015  . CARPAL TUNNEL RELEASE Right 01/06/2020   Procedure: CARPAL TUNNEL RELEASE;  Surgeon: Dereck Leep, MD;  Location: [facility] ORS;  Service: Orthopedics;  Laterality: Right;  . CHOLECYSTECTOMY N/A 07/07/2018   Procedure: LAPAROSCOPIC CHOLECYSTECTOMY;  Surgeon: [name], MD;  Location: [facility] ORS;  Service: General;  Laterality: N/A;  . COLONOSCOPY    . COLONOSCOPY WITH PROPOFOL N/A 08/05/2017   Procedure: COLONOSCOPY WITH PROPOFOL;  Surgeon: Manya Silvas, MD;  Location: [facility] ENDOSCOPY;  Service: Endoscopy;  Laterality: N/A;  . DEBRIDEMENT OF ABDOMINAL WALL ABSCESS N/A 07/24/2019   Procedure: DEBRIDEMENT OF ABDOMINAL WALL ABSCESS;  Surgeon: [name], MD;  Location: [facility] ORS;  Service: General;  Laterality: N/A;  . ENDOSCOPIC RETROGRADE CHOLANGIOPANCREATOGRAPHY (ERCP) WITH PROPOFOL N/A 05/16/2018   Procedure: ENDOSCOPIC RETROGRADE CHOLANGIOPANCREATOGRAPHY (ERCP) WITH PROPOFOL;  Surgeon: Lucilla Lame, MD;  Location: [facility] ENDOSCOPY;  Service: Endoscopy;  Laterality: N/A;  . SPLENECTOMY    . UMBILICAL HERNIA REPAIR N/A 07/24/2019   Procedure: HERNIA REPAIR UMBILICAL ADULT;  Surgeon: [name], MD;  Location: [facility] ORS;  Service: General;  Laterality: N/A;     reports that he quit smoking about 31 years ago. His smoking use included cigarettes. He smoked 1.00 pack per day. He has never used smokeless tobacco. He reports previous alcohol use. He reports that he does not use drugs.  No Known Allergies  No family history on file.   Prior to Admission medications   Medication Sig Start Date End Date Taking? Authorizing Provider  acetaminophen (TYLENOL) 500 MG tablet Take 500 mg by mouth in the morning. Patient not  taking: Reported on 12/23/2019    [provider]  aspirin EC 81 MG tablet Take 81 mg by mouth daily.     [provider]  atorvastatin (LIPITOR) 40 MG tablet Take 40 mg by mouth at bedtime.     [provider]  hydrochlorothiazide (HYDRODIURIL) 25 MG tablet Take 25 mg by mouth daily. 07/14/19   [provider]   HYDROcodone-acetaminophen (NORCO) 5-325 MG tablet Take 1-2 tablets by mouth every 4 (four) hours as needed for moderate pain. 01/06/20   Hooten, Laurice Record, MD  lisinopril (PRINIVIL,ZESTRIL) 40 MG tablet Take 40 mg by mouth every morning.  05/01/15   [provider]  Melatonin 10 MG TABS Take 10 mg by mouth at bedtime.    [provider]  metFORMIN (GLUCOPHAGE) 500 MG tablet Take 500-1,000 mg by mouth See admin instructions. Take 1000 mg by mouth in the morning and 500 mg at night 02/28/18   [provider]  naproxen sodium (ALEVE) 220 MG tablet Take 220-440 mg by mouth daily as needed (pain).     [provider]  Omega-3 Fatty Acids (FISH OIL) 1000 MG CAPS Take 1,000 mg by mouth daily.    [provider]  sildenafil (VIAGRA) 100 MG tablet Take 100 mg by mouth at bedtime as needed for erectile dysfunction.     [provider]    Physical Exam: Vitals:   02/12/20 1800 02/12/20 1845 02/12/20 1900 02/12/20 1910  BP: 128/72 117/67 140/65   Pulse: 74 79 85 78  Resp: 16  18   Temp:      SpO2: 92% 90% 94% 94%  Weight:      Height:        Constitutional: NAD, calm, comfortable Vitals:   02/12/20 1800 02/12/20 1845 02/12/20 1900 02/12/20 1910  BP: 128/72 117/67 140/65   Pulse: 74 79 85 78  Resp: 16  18   Temp:      SpO2: 92% 90% 94% 94%  Weight:      Height:       Eyes: PERRL, lids and conjunctivae normal ENMT: Mucous membranes are moist. Posterior pharynx clear of any exudate or lesions.Normal dentition.  Neck: normal, supple, no masses, no thyromegaly Respiratory: Shallow fast breathing, clear to auscultation bilaterally, no wheezing, no crackles.  Increasing respiratory effort. No accessory muscle use.  Cardiovascular: Regular rate and rhythm, no murmurs / rubs / gallops. No extremity edema. 2+ pedal pulses. No carotid bruits.  Abdomen: no tenderness, no masses palpated. No hepatosplenomegaly. Bowel sounds positive.  Musculoskeletal:  no clubbing / cyanosis. No joint deformity upper and lower extremities. Good ROM, no contractures. Normal muscle tone.  Skin: no rashes, lesions, ulcers. No induration Neurologic: CN 2-12 grossly intact. Sensation intact, DTR normal. Strength 5/5 in all 4.  Psychiatric: Normal judgment and insight. Alert and oriented x 3. Normal mood.     Labs on Admission: I have personally reviewed following labs and imaging studies  CBC: Recent Labs  Lab 02/12/20 1507  WBC 21.0*  HGB 14.5  HCT 43.0  MCV 88.7  PLT 0000000*   Basic Metabolic Panel: Recent Labs  Lab 02/12/20 1507  NA 133*  K 5.1  CL 99  CO2 22  GLUCOSE 442*  BUN 44*  CREATININE 1.38*  CALCIUM 9.7   GFR: Estimated Creatinine Clearance: 57.5 mL/min (A) (by C-G formula based on SCr of 1.38 mg/dL (H)). Liver Function Tests: No results for input(s): AST, ALT, ALKPHOS, BILITOT, PROT, ALBUMIN in the  last 168 hours. No results for input(s): LIPASE, AMYLASE in the last 168 hours. No results for input(s): AMMONIA in the last 168 hours. Coagulation Profile: No results for input(s): INR, PROTIME in the last 168 hours. Cardiac Enzymes: No results for input(s): CKTOTAL, CKMB, CKMBINDEX, TROPONINI in the last 168 hours. BNP (last 3 results) No results for input(s): PROBNP in the last 8760 hours. HbA1C: No results for input(s): HGBA1C in the last 72 hours. CBG: No results for input(s): GLUCAP in the last 168 hours. Lipid Profile: No results for input(s): CHOL, HDL, LDLCALC, TRIG, CHOLHDL, LDLDIRECT in the last 72 hours. Thyroid Function Tests: No results for input(s): TSH, T4TOTAL, FREET4, T3FREE, THYROIDAB in the last 72 hours. Anemia Panel: No results for input(s): VITAMINB12, FOLATE, FERRITIN, TIBC, IRON, RETICCTPCT in the last 72 hours. Urine analysis:    Component Value Date/Time   COLORURINE YELLOW (A) 05/15/2018 0819   APPEARANCEUR HAZY (A) 05/15/2018 0819   LABSPEC >1.046 (H) 05/15/2018 0819   PHURINE 5.0 05/15/2018  0819   GLUCOSEU >=500 (A) 05/15/2018 0819   HGBUR NEGATIVE 05/15/2018 0819   BILIRUBINUR NEGATIVE 05/15/2018 0819   KETONESUR NEGATIVE 05/15/2018 0819   PROTEINUR 30 (A) 05/15/2018 0819   NITRITE NEGATIVE 05/15/2018 0819   LEUKOCYTESUR NEGATIVE 05/15/2018 0819    Radiological Exams on Admission: DG Chest Portable 1 View  Result Date: 02/12/2020 CLINICAL DATA:  Shortness of breath. EXAM: PORTABLE CHEST 1 VIEW COMPARISON:  05/16/2018 FINDINGS: There are diffuse bilateral hazy airspace opacities. No pneumothorax or large pleural effusion. The heart size is stable. Aortic calcifications are noted. There is no acute osseous abnormality. IMPRESSION: Diffuse bilateral hazy airspace opacities consistent with the patient's history of viral pneumonia. Electronically Signed   By: Constance Holster M.D.   On: 02/12/2020 18:27    EKG: Ordered  Assessment/Plan Active Problems:   COVID-19 virus infection   COVID-19  (please populate well all problems here in Problem List. (For example, if patient is on BP meds at home and you resume or decide to hold them, it is a problem that needs to be her. Same for CAD, COPD, HLD and so on)  Acute hypoxic respite failure -Secondary to COVID-19 pneumonia, breakthrough case -Start remdesivir and steroid in the ER -Breathing treatment -Wean down oxygen -F/U COVID labs. EKG sent.  IIDM with hyperglycemia -Add Lantus 10 units at bedtime -NovoLog 5 units 3 times daily AC -Sliding scale -Continue Metformin  HTN -Fairly controlled, continue ACEI  Thrombocytosis -Chronic,with active COVID infection, in theory will be at increased risk of thrombo-embolic risk. Will trend D-Dimer and other markers to decide to rule out clotting issue.  Explained to the patient about potential risk, patient expressed understanding.  Leukocytosis -Baseline level, borderline elevation to procalcitonin level -Infiltrates pattern COVID> CAP, will hold off additional  ABX.  HLD -Statin  Morbid obesity -outpt bariatric evaluation   DVT prophylaxis: Lovenox Code Status: Full Code Family Communication: None at bedside Disposition Plan: Expect more than 2 midnight hospital stay for COVID treatment and weaning down O2. Consults called: None Admission status: MedSurg admit   Lequita Halt MD [facility] Pager [phone]  02/12/2020, 8:03 PM

## 2020-02-12 NOTE — Progress Notes (Signed)
PHARMACIST - PHYSICIAN COMMUNICATION  CONCERNING:  Enoxaparin (Lovenox) for DVT Prophylaxis    RECOMMENDATION: Patient was prescribed enoxaprin 40mg  q24 hours for VTE prophylaxis.   Filed Weights   02/12/20 1502  Weight: 107.5 kg (236 lb 15.9 oz)    Body mass index is 36.03 kg/m.  Estimated Creatinine Clearance: 57.5 mL/min (A) (by C-G formula based on SCr of 1.38 mg/dL (H)).   Based on [place] patient is candidate for enoxaparin 0.5mg /kg TBW SQ every 24 hours based on BMI being >30.   DESCRIPTION: Pharmacy has adjusted enoxaparin dose per [facility] policy.  Patient is now receiving enoxaparin 55 mg every 24 hours    Berta Minor, PharmD Clinical Pharmacist  02/12/2020 8:12 PM

## 2020-02-13 DIAGNOSIS — U071 COVID-19: Secondary | ICD-10-CM | POA: Diagnosis not present

## 2020-02-13 LAB — COMPREHENSIVE METABOLIC PANEL
ALT: 25 U/L (ref 0–44)
AST: 22 U/L (ref 15–41)
Albumin: 2.9 g/dL — ABNORMAL LOW (ref 3.5–5.0)
Alkaline Phosphatase: 54 U/L (ref 38–126)
Anion gap: 11 (ref 5–15)
BUN: 52 mg/dL — ABNORMAL HIGH (ref 8–23)
CO2: 23 mmol/L (ref 22–32)
Calcium: 9.5 mg/dL (ref 8.9–10.3)
Chloride: 100 mmol/L (ref 98–111)
Creatinine, Ser: 1.23 mg/dL (ref 0.61–1.24)
GFR, Estimated: >60 mL/min (ref >60)
Glucose, Bld: 434 mg/dL — ABNORMAL HIGH (ref 70–99)
Potassium: 5 mmol/L (ref 3.5–5.1)
Sodium: 134 mmol/L — ABNORMAL LOW (ref 135–145)
Total Bilirubin: 0.9 mg/dL (ref 0.3–1.2)
Total Protein: 8.2 g/dL — ABNORMAL HIGH (ref 6.5–8.1)

## 2020-02-13 LAB — CBG MONITORING, ED
Glucose-Capillary: 448 mg/dL — ABNORMAL HIGH (ref 70–99)
Glucose-Capillary: 414 mg/dL — ABNORMAL HIGH (ref 70–99)
Glucose-Capillary: 388 mg/dL — ABNORMAL HIGH (ref 70–99)
Glucose-Capillary: 436 mg/dL — ABNORMAL HIGH (ref 70–99)
Glucose-Capillary: 376 mg/dL — ABNORMAL HIGH (ref 70–99)

## 2020-02-13 LAB — CBC WITH DIFFERENTIAL/PLATELET
Abs Immature Granulocytes: 0.31 10*3/uL — ABNORMAL HIGH (ref 0.00–0.07)
Basophils Absolute: 0 10*3/uL (ref 0.0–0.1)
Basophils Relative: 0 %
Eosinophils Absolute: 0 10*3/uL (ref 0.0–0.5)
Eosinophils Relative: 0 %
HCT: 40.8 % (ref 39.0–52.0)
Hemoglobin: 13.9 g/dL (ref 13.0–17.0)
Immature Granulocytes: 2 %
Lymphocytes Relative: 7 %
Lymphs Abs: 1.4 10*3/uL (ref 0.7–4.0)
MCH: 30.2 pg (ref 26.0–34.0)
MCHC: 34.1 g/dL (ref 30.0–36.0)
MCV: 88.7 fL (ref 80.0–100.0)
Monocytes Absolute: 0.7 10*3/uL (ref 0.1–1.0)
Monocytes Relative: 4 %
Neutro Abs: 18.1 10*3/uL — ABNORMAL HIGH (ref 1.7–7.7)
Neutrophils Relative %: 87 %
Platelets: 935 10*3/uL (ref 150–400)
RBC: 4.6 MIL/uL (ref 4.22–5.81)
RDW: 12.1 % (ref 11.5–15.5)
Smear Review: INCREASED
WBC: 20.6 10*3/uL — ABNORMAL HIGH (ref 4.0–10.5)
nRBC: 0.2 % (ref 0.0–0.2)

## 2020-02-13 LAB — C-REACTIVE PROTEIN
CRP: 18.9 mg/dL — ABNORMAL HIGH (ref <1.0)
CRP: 19.3 mg/dL — ABNORMAL HIGH (ref <1.0)

## 2020-02-13 LAB — MAGNESIUM: Magnesium: 2.5 mg/dL — ABNORMAL HIGH (ref 1.7–2.4)

## 2020-02-13 LAB — FERRITIN: Ferritin: 919 ng/mL — ABNORMAL HIGH (ref 24–336)

## 2020-02-13 LAB — D-DIMER, QUANTITATIVE: D-Dimer, Quant: 1.31 ug/mL-FEU — ABNORMAL HIGH (ref 0.00–0.50)

## 2020-02-13 LAB — HEPATITIS B SURFACE ANTIGEN: Hepatitis B Surface Ag: NONREACTIVE

## 2020-02-13 LAB — PHOSPHORUS: Phosphorus: 4.5 mg/dL (ref 2.5–4.6)

## 2020-02-13 MED ORDER — SODIUM CHLORIDE 0.9 % IV SOLN: Freq: Once | INTRAVENOUS | Status: AC

## 2020-02-13 MED ORDER — METHYLPREDNISOLONE SODIUM SUCC 40 MG IJ SOLR
40.0000 mg | Freq: Two times a day (BID) | INTRAMUSCULAR | Status: AC
  Administered 2020-02-13 – 2020-02-15 (×4): 40 mg via INTRAVENOUS
  Filled 2020-02-13 (×4): qty 1

## 2020-02-13 MED ORDER — INSULIN GLARGINE 100 UNIT/ML ~~LOC~~ SOLN
10.0000 [IU] | Freq: Once | SUBCUTANEOUS | Status: AC
  Administered 2020-02-13: 10 [IU] via SUBCUTANEOUS
  Filled 2020-02-13: qty 0.1

## 2020-02-13 MED ORDER — PREDNISONE 50 MG PO TABS
50.0000 mg | ORAL_TABLET | Freq: Every day | ORAL | Status: DC
  Administered 2020-02-15 – 2020-02-18 (×4): 50 mg via ORAL
  Filled 2020-02-13 (×4): qty 1

## 2020-02-13 MED ORDER — INSULIN ASPART 100 UNIT/ML ~~LOC~~ SOLN
0.0000 [IU] | SUBCUTANEOUS | Status: DC
  Administered 2020-02-13 (×2): 15 [IU] via SUBCUTANEOUS
  Administered 2020-02-14: 11 [IU] via SUBCUTANEOUS
  Filled 2020-02-13 (×3): qty 1

## 2020-02-13 MED ORDER — INSULIN ASPART 100 UNIT/ML ~~LOC~~ SOLN
7.0000 [IU] | Freq: Three times a day (TID) | SUBCUTANEOUS | Status: DC
  Administered 2020-02-13 (×2): 7 [IU] via SUBCUTANEOUS
  Filled 2020-02-13 (×2): qty 1

## 2020-02-13 MED ORDER — INSULIN GLARGINE 100 UNIT/ML ~~LOC~~ SOLN
15.0000 [IU] | Freq: Every day | SUBCUTANEOUS | Status: DC
  Administered 2020-02-13: 15 [IU] via SUBCUTANEOUS
  Filled 2020-02-13 (×2): qty 0.15

## 2020-02-13 MED ORDER — INSULIN GLARGINE 100 UNIT/ML ~~LOC~~ SOLN
15.0000 [IU] | Freq: Every day | SUBCUTANEOUS | Status: DC
  Filled 2020-02-13 (×2): qty 0.15

## 2020-02-13 NOTE — ED Notes (Signed)
Pt inc to humidified 6L via [place] as at 4L pt 89-92%.

## 2020-02-13 NOTE — ED Notes (Signed)
Pt given lunch tray.

## 2020-02-13 NOTE — ED Notes (Signed)
Pt up to bedside toilet.  

## 2020-02-13 NOTE — Progress Notes (Signed)
PROGRESS NOTE    Brent [name].  SAY:[id number] DOB: Jun 08, 1945 DOA: 02/12/2020 PCP: [name], MD   Chief Complaint  Patient presents with  . 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  History of Moderna COVID-vaccine x2 last year. In the ED saturating well on 3 L nasal cannula chest x-ray with diffuse bilateral multifocal pneumonia lab work with hyperglycemia and leukocytosis Patient was started on remdesivir and steroid supplemental oxygen and admitted for further management  Subjective: Patient reports he feels better.  No new complaints. He is on 4 L nasal cannula, normally not on home oxygen.  Assessment & Plan:  Acute hypoxic respiratory failure due to COVID-19 pneumonia/COVID-19 infection with multifocal pneumonia.  Tested positive on 12/24.  Patient is high risk given given his morbid obesity, immunocompromised with history of splenectomy. Continue remdesivir, systemic steroids breathing treatment supplemental oxygen.  Monitor COVID-19 labs as below , CRP pending.  Monitor closely for respiratory decompensation. COVID-19 Labs Recent Labs    02/12/20 2001 02/12/20 2013 02/13/20 0619  DDIMER 1.31*  --   --   FERRITIN  --  863* 919*  LDH  --  210*  --   CRP  --  18.9*  --    Diabetes mellitus with uncontrolled hyperglycemia, check hemoglobin A1c.  Give lantus 10 ux1 and increase lantus to 15 units qhs tonight  Increase  to 7 units  Tid premeal novolog and cont sliding scale at q4hr and monitor cbg. Cont home metformin and adjust insulin regimen.  Recent Labs  Lab 02/12/20 2127 02/13/20 1003 02/13/20 1321   GLUCAP 390* 448* 414*   Hypertension/HLD: Blood pressure is well controlled.  Continue HCTZ ACE inhibitor.  Continue home statin.  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  Nutrition: Diet Order            Diet heart healthy/carb modified Room service appropriate? Yes; Fluid consistency: Thin  Diet effective now                 Body mass index is 36.03 kg/m.  DVT prophylaxis: Lovenox Code Status:   Code Status: Full Code  Family Communication: plan of care discussed with patient at bedside.  Status is: Inpatient  Remains inpatient appropriate because:IV treatments appropriate due to intensity of illness or inability to take PO and Inpatient level of care appropriate due to severity of illness   Dispo: The patient is from: Home              Anticipated d/c is to: Home              Anticipated d/c date is: 3 days              Patient currently is not medically stable to d/c.   Consultants:see note  Procedures:see note  Culture/Microbiology    Component Value Date/Time   SDES BLOOD RIGHT ANTECUBITAL 02/12/2020 1942   SDES BLOOD LEFT ANTECUBITAL 02/12/2020 1942   SPECREQUEST  02/12/2020 1942    BOTTLES DRAWN AEROBIC AND ANAEROBIC Blood Culture adequate volume   SPECREQUEST  02/12/2020 1942    BOTTLES DRAWN  AEROBIC AND ANAEROBIC Blood Culture adequate volume   CULT  02/12/2020 1942    NO GROWTH < 12 HOURS Performed at [facility], [address] [place], [place] [address]    CULT  02/12/2020 1942    NO GROWTH < 12 HOURS Performed at [facility], [address] [place], [place] [address]    REPTSTATUS PENDING 02/12/2020 1942   REPTSTATUS PENDING 02/12/2020 1942    Other culture-see note  Medications: Scheduled Meds: . aspirin EC  81 mg Oral Daily  . atorvastatin  40 mg Oral QHS  . enoxaparin (LOVENOX) injection  0.5  mg/kg Subcutaneous Q24H  . hydrochlorothiazide  25 mg Oral Daily  . insulin aspart  0-15 Units Subcutaneous Q4H  . insulin aspart  7 Units Subcutaneous TID WC  . insulin glargine  15 Units Subcutaneous QHS  . Ipratropium-Albuterol  1 puff Inhalation Q6H  . lisinopril  40 mg Oral BH-q7a  . melatonin  10 mg Oral QHS  . methylPREDNISolone (SOLU-MEDROL) injection  1 mg/kg Intravenous Q12H   Followed by  . [START ON 02/15/2020] predniSONE  50 mg Oral Daily  . omega-3 acid ethyl esters  1 g Oral Daily   Continuous Infusions: . remdesivir 100 mg in NS 100 mL Stopped (02/13/20 1232)    Antimicrobials: Anti-infectives (From admission, onward)   Start     Dose/Rate Route Frequency Ordered Stop   02/13/20 1000  remdesivir 100 mg in sodium chloride 0.9 % 100 mL IVPB       "Followed by" Linked Group Details   100 mg 200 mL/hr over 30 Minutes Intravenous Daily 02/12/20 1817 02/17/20 0959   02/13/20 1000  remdesivir 100 mg in sodium chloride 0.9 % 100 mL IVPB  Status:  Discontinued       "Followed by" Linked Group Details   100 mg 200 mL/hr over 30 Minutes Intravenous Daily 02/12/20 2002 02/12/20 2005   02/12/20 2015  remdesivir 200 mg in sodium chloride 0.9% 250 mL IVPB  Status:  Discontinued       "Followed by" Linked Group Details   200 mg 580 mL/hr over 30 Minutes Intravenous Once 02/12/20 2002 02/12/20 2005   02/12/20 1930  remdesivir 200 mg in sodium chloride 0.9% 250 mL IVPB       "Followed by" Linked Group Details   200 mg 580 mL/hr over 30 Minutes Intravenous Once 02/12/20 1817 02/12/20 1954   02/12/20 1930  azithromycin (ZITHROMAX) 500 mg in sodium chloride 0.9 % 250 mL IVPB        500 mg 250 mL/hr over 60 Minutes Intravenous  Once 02/12/20 1919 02/12/20 2133     Objective: Vitals: Today's Vitals   02/13/20 1230 02/13/20 1245 02/13/20 1300 02/13/20 1303  BP: 129/65  129/65   Pulse: 65  66   Resp:  20    Temp:      SpO2:    94%  Weight:      Height:      PainSc:        No intake or output data in the 24 hours ending 02/13/20 1409 Filed Weights   02/12/20 1502  Weight: 107.5 kg   Weight change:   Intake/Output from previous day: No intake/output data recorded. Intake/Output this shift: No intake/output data recorded.  Examination: General exam: AAOx3,NAD, weak appearing. HEENT:Oral mucosa moist, Ear/Nose WNL grossly,dentition normal. Respiratory system: bilaterally diminished breath sound,no use of accessory muscle, non tender. Cardiovascular system: S1 & S2 +, regular, No JVD.  Gastrointestinal system: Abdomen soft, NT,ND, BS+. Nervous System:Alert, awake, moving extremities and grossly nonfocal Extremities: No edema, distal peripheral pulses palpable.  Skin: No rashes,no icterus. MSK: Normal muscle bulk,tone, power  Data Reviewed: I have personally reviewed following labs and imaging studies CBC: Recent Labs  Lab 02/12/20 1507 02/13/20 0619  WBC 21.0* 20.6*  NEUTROABS  --  18.1*  HGB 14.5 13.9  HCT 43.0 40.8  MCV 88.7 88.7  PLT 907* 211*   Basic Metabolic Panel: Recent Labs  Lab 02/12/20 1507 02/13/20 0619  NA 133* 134*  K 5.1 5.0  CL 99 100  CO2 22 23  GLUCOSE 442* 434*  BUN 44* 52*  CREATININE 1.38* 1.23  CALCIUM 9.7 9.5  MG  --  2.5*  PHOS  --  4.5   GFR: Estimated Creatinine Clearance: 64.5 mL/min (by C-G formula based on SCr of 1.23 mg/dL). Liver Function Tests: Recent Labs  Lab 02/13/20 0619  AST 22  ALT 25  ALKPHOS 54  BILITOT 0.9  PROT 8.2*  ALBUMIN 2.9*   No results for input(s): LIPASE, AMYLASE in the last 168 hours. No results for input(s): AMMONIA in the last 168 hours. Coagulation Profile: No results for input(s): INR, PROTIME in the last 168 hours. Cardiac Enzymes: No results for input(s): CKTOTAL, CKMB, CKMBINDEX, TROPONINI in the last 168 hours. BNP (last 3 results) No results for input(s): PROBNP in the last 8760 hours. HbA1C: No results for input(s): HGBA1C in the last 72  hours. CBG: Recent Labs  Lab 02/12/20 2127 02/13/20 1003 02/13/20 1321  GLUCAP 390* 448* 414*   Lipid Profile: No results for input(s): CHOL, HDL, LDLCALC, TRIG, CHOLHDL, LDLDIRECT in the last 72 hours. Thyroid Function Tests: No results for input(s): TSH, T4TOTAL, FREET4, T3FREE, THYROIDAB in the last 72 hours. Anemia Panel: Recent Labs    02/12/20 2013 02/13/20 0619  FERRITIN 863* 919*   Sepsis Labs: Recent Labs  Lab 02/12/20 1507  PROCALCITON 0.14    Recent Results (from the past 240 hour(s))  Blood culture (routine x 2)     Status: None (Preliminary result)   Collection Time: 02/12/20  7:42 PM   Specimen: BLOOD  Result Value Ref Range Status   Specimen Description BLOOD RIGHT ANTECUBITAL  Final   Special Requests   Final    BOTTLES DRAWN AEROBIC AND ANAEROBIC Blood Culture adequate volume   Culture   Final    NO GROWTH < 12 HOURS Performed at [facility], [address], [place], [place] [address]    Report Status PENDING  Incomplete  Blood culture (routine x 2)     Status: None (Preliminary result)   Collection Time: 02/12/20  7:42 PM   Specimen: BLOOD  Result Value Ref Range Status   Specimen Description BLOOD LEFT ANTECUBITAL  Final   Special Requests   Final    BOTTLES DRAWN AEROBIC AND ANAEROBIC Blood Culture adequate volume   Culture   Final    NO GROWTH < 12 HOURS Performed at [facility], [address], [place],  [address]    Report Status PENDING  Incomplete     Radiology Studies: DG Chest Portable 1 View  Result Date: 02/12/2020 CLINICAL DATA:  Shortness of breath. EXAM: PORTABLE CHEST 1 VIEW COMPARISON:  05/16/2018 FINDINGS: There are diffuse bilateral hazy airspace opacities. No pneumothorax or large pleural effusion. The heart size is stable. Aortic calcifications are noted. There is no acute osseous abnormality. IMPRESSION: Diffuse bilateral hazy airspace opacities consistent with  the patient's history of viral  pneumonia. Electronically Signed   By: Constance Holster M.D.   On: 02/12/2020 18:27     LOS: 1 day   Antonieta Pert, MD [facility]  02/13/2020, 2:09 PM

## 2020-02-13 NOTE — ED Notes (Signed)
MD Maren Beach notified of patients blood sugar result 448

## 2020-02-13 NOTE — ED Notes (Signed)
Pt have dinner tray. Given water as requested.

## 2020-02-13 NOTE — ED Notes (Signed)
Patient eating breakfast tray at this time. 

## 2020-02-14 DIAGNOSIS — U071 COVID-19: Secondary | ICD-10-CM | POA: Diagnosis not present

## 2020-02-14 LAB — CBC WITH DIFFERENTIAL/PLATELET
Abs Immature Granulocytes: 0.92 10*3/uL — ABNORMAL HIGH (ref 0.00–0.07)
Basophils Absolute: 0.1 10*3/uL (ref 0.0–0.1)
Basophils Relative: 0 %
Eosinophils Absolute: 0 10*3/uL (ref 0.0–0.5)
Eosinophils Relative: 0 %
HCT: 41.2 % (ref 39.0–52.0)
Hemoglobin: 14 g/dL (ref 13.0–17.0)
Immature Granulocytes: 3 %
Lymphocytes Relative: 5 %
Lymphs Abs: 1.7 10*3/uL (ref 0.7–4.0)
MCH: 30.1 pg (ref 26.0–34.0)
MCHC: 34 g/dL (ref 30.0–36.0)
MCV: 88.6 fL (ref 80.0–100.0)
Monocytes Absolute: 2.1 10*3/uL — ABNORMAL HIGH (ref 0.1–1.0)
Monocytes Relative: 7 %
Neutro Abs: 27.2 10*3/uL — ABNORMAL HIGH (ref 1.7–7.7)
Neutrophils Relative %: 85 %
Platelets: 1070 10*3/uL (ref 150–400)
RBC: 4.65 MIL/uL (ref 4.22–5.81)
RDW: 12.3 % (ref 11.5–15.5)
Smear Review: INCREASED
WBC: 32 10*3/uL — ABNORMAL HIGH (ref 4.0–10.5)
nRBC: 0.1 % (ref 0.0–0.2)

## 2020-02-14 LAB — CBG MONITORING, ED
Glucose-Capillary: 173 mg/dL — ABNORMAL HIGH (ref 70–99)
Glucose-Capillary: 182 mg/dL — ABNORMAL HIGH (ref 70–99)
Glucose-Capillary: 262 mg/dL — ABNORMAL HIGH (ref 70–99)
Glucose-Capillary: 292 mg/dL — ABNORMAL HIGH (ref 70–99)
Glucose-Capillary: 306 mg/dL — ABNORMAL HIGH (ref 70–99)
Glucose-Capillary: 313 mg/dL — ABNORMAL HIGH (ref 70–99)
Glucose-Capillary: 332 mg/dL — ABNORMAL HIGH (ref 70–99)

## 2020-02-14 LAB — COMPREHENSIVE METABOLIC PANEL
ALT: 32 U/L (ref 0–44)
AST: 26 U/L (ref 15–41)
Albumin: 2.8 g/dL — ABNORMAL LOW (ref 3.5–5.0)
Alkaline Phosphatase: 53 U/L (ref 38–126)
Anion gap: 11 (ref 5–15)
BUN: 60 mg/dL — ABNORMAL HIGH (ref 8–23)
CO2: 22 mmol/L (ref 22–32)
Calcium: 9.8 mg/dL (ref 8.9–10.3)
Chloride: 101 mmol/L (ref 98–111)
Creatinine, Ser: 1.4 mg/dL — ABNORMAL HIGH (ref 0.61–1.24)
GFR, Estimated: 53 mL/min — ABNORMAL LOW (ref >60)
Glucose, Bld: 271 mg/dL — ABNORMAL HIGH (ref 70–99)
Potassium: 4.7 mmol/L (ref 3.5–5.1)
Sodium: 134 mmol/L — ABNORMAL LOW (ref 135–145)
Total Bilirubin: 0.8 mg/dL (ref 0.3–1.2)
Total Protein: 7.9 g/dL (ref 6.5–8.1)

## 2020-02-14 LAB — MAGNESIUM: Magnesium: 2.5 mg/dL — ABNORMAL HIGH (ref 1.7–2.4)

## 2020-02-14 LAB — PHOSPHORUS: Phosphorus: 4.1 mg/dL (ref 2.5–4.6)

## 2020-02-14 LAB — HEMOGLOBIN A1C
Hgb A1c MFr Bld: 8.1 % — ABNORMAL HIGH (ref 4.8–5.6)
Mean Plasma Glucose: 185.77 mg/dL

## 2020-02-14 LAB — C-REACTIVE PROTEIN: CRP: 11.1 mg/dL — ABNORMAL HIGH (ref <1.0)

## 2020-02-14 LAB — FERRITIN: Ferritin: 1112 ng/mL — ABNORMAL HIGH (ref 24–336)

## 2020-02-14 LAB — PROCALCITONIN: Procalcitonin: <0.1 ng/mL

## 2020-02-14 LAB — D-DIMER, QUANTITATIVE: D-Dimer, Quant: 0.83 ug/mL-FEU — ABNORMAL HIGH (ref 0.00–0.50)

## 2020-02-14 MED ORDER — INSULIN GLARGINE 100 UNIT/ML ~~LOC~~ SOLN
12.0000 [IU] | Freq: Two times a day (BID) | SUBCUTANEOUS | Status: DC
  Administered 2020-02-14 – 2020-02-18 (×8): 12 [IU] via SUBCUTANEOUS
  Filled 2020-02-14 (×14): qty 0.12

## 2020-02-14 MED ORDER — INSULIN ASPART 100 UNIT/ML ~~LOC~~ SOLN
10.0000 [IU] | Freq: Three times a day (TID) | SUBCUTANEOUS | Status: DC
  Administered 2020-02-14 – 2020-02-18 (×11): 10 [IU] via SUBCUTANEOUS
  Filled 2020-02-14 (×11): qty 1

## 2020-02-14 MED ORDER — INSULIN ASPART 100 UNIT/ML ~~LOC~~ SOLN
0.0000 [IU] | SUBCUTANEOUS | Status: DC
  Administered 2020-02-14: 4 [IU] via SUBCUTANEOUS
  Administered 2020-02-14 (×2): 15 [IU] via SUBCUTANEOUS
  Administered 2020-02-14 (×2): 11 [IU] via SUBCUTANEOUS
  Administered 2020-02-15 (×3): 7 [IU] via SUBCUTANEOUS
  Administered 2020-02-15 (×2): 11 [IU] via SUBCUTANEOUS
  Administered 2020-02-16: 15 [IU] via SUBCUTANEOUS
  Administered 2020-02-16: 7 [IU] via SUBCUTANEOUS
  Administered 2020-02-16 (×2): 15 [IU] via SUBCUTANEOUS
  Administered 2020-02-16 – 2020-02-17 (×2): 7 [IU] via SUBCUTANEOUS
  Administered 2020-02-17: 20 [IU] via SUBCUTANEOUS
  Administered 2020-02-18: 01:00:00 3 [IU] via SUBCUTANEOUS
  Administered 2020-02-18: 7 [IU] via SUBCUTANEOUS
  Filled 2020-02-14 (×19): qty 1

## 2020-02-14 NOTE — ED Notes (Signed)
Patient resting comfortably at this time.Will continue to monitor.

## 2020-02-14 NOTE — ED Notes (Signed)
Pt up to restroom to void.  

## 2020-02-14 NOTE — ED Notes (Signed)
Secured flutter valve and IS for pt. Will wait until pt awake to instruct on use.

## 2020-02-14 NOTE — Progress Notes (Signed)
Notified by RN that patient requires IV insulin per current order for BG> 300. On review, appears BG steadily improving over the past 24 hours 448>414>388>436>376>332>313. He is also on steroids.   Hyperglycemic state likely medication induced high dose steroid. No s/s of DKA or HHS Hx: DM2 on Metformin at home Recent HgbA1c pending  -Change SSI to Novolog resistant Q4hrs -Increase Novolog to 10 units TID with meals -Continue Lantus 15 units at bedside -Monitor electrolytes -Consult Diabetes co-ordinator    Rufina Falco, BSN, MSN, DNP, CCRN,FNP-C  [facility] Nurse Practitioner  [facility]

## 2020-02-14 NOTE — ED Notes (Signed)
Patient eating supper tray at this time. 

## 2020-02-14 NOTE — ED Notes (Signed)
Lab here for bloodwork

## 2020-02-14 NOTE — ED Notes (Signed)
Report to amber, rn.  

## 2020-02-14 NOTE — Progress Notes (Signed)
PROGRESS NOTE    Brent [name].  OZH:[id number] DOB: 1945-02-21 DOA: 02/12/2020 PCP: [name], MD   Chief Complaint  Patient presents with  . 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  History of Moderna COVID-vaccine x2 last year, and has not had booster yet. In the ED saturating well on 3 L nasal cannula chest x-ray with diffuse bilateral multifocal pneumonia lab work with hyperglycemia and leukocytosis Patient was started on remdesivir and steroid supplemental oxygen and admitted for further management  Subjective: He feels some better compared to prior. Eating better. On 6l [place], ate well this am. "little bit chest pain once in a while" Blood sugar slowly improving  No tachypnea heart rate in 50s.  Assessment & Plan:  Acute hypoxic respiratory failure due to COVID-19 pneumonia/COVID-19 infection with multifocal pneumonia.Tested positive on 12/24.  Patient is high risk given his morbid obesity, diabetes mellitus, immunocompromise due to spleenectomy. Continue iv remdesivir, IV Solu-Medrol with close monitoring of his blood glucose as they have been poorly controlled.  Continue supplemental oxygen.Monitor inflammatory markers as below, noted high CRP, procal reassuring. Monitor closely for respiratory decompensation- if worsens he is okay with actemra or baricitinib if no contraindication. COVID-19 Labs Recent Labs    02/12/20 2001 02/12/20 2013 02/13/20 0619 02/14/20 0704  DDIMER 1.31*  --   --   --   FERRITIN  --  863* 919* 1,112*  LDH  --  210*  --   --   CRP  --  18.9* 19.3*  --    Diabetes mellitus with  uncontrolled hyperglycemia, in the setting of stress/steroid , cont every 4 hours resistant sliding scale, 10 units Premeal insulin, and change lantus from 15 u bedtime to 12 units BID,hba1c pending.Cont home metformin and adjust insulin regimen.  Recent Labs  Lab 02/13/20 1934 02/13/20 2241 02/14/20 0008 02/14/20 0325 02/14/20 0901  GLUCAP 436* 376* 332* 313* 262*  .last Hypertension/HLD:BP appears stable.continue HCTZ ACE inhibitor.  Continue home statin.  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  Morbid obesity with BMI 36: Will benefit with weight loss.  Nutrition: Diet Order            Diet heart healthy/carb modified Room service appropriate? Yes; Fluid consistency: Thin  Diet effective now                 Body mass index is 36.03 kg/m.  DVT prophylaxis: Lovenox Code Status:   Code Status: Full Code  Family Communication: plan of care discussed with patient at bedside. I called and updated his wife [name].  Status is: Inpatient  Remains inpatient appropriate because:IV treatments appropriate due to intensity of illness or inability to take PO and Inpatient level of care appropriate due to severity of illness  Dispo: The patient is from: Home              Anticipated d/c is to: Home              Anticipated d/c date is: 3 days              Patient currently is not medically stable  to d/c. Consultants:see note  Procedures:see note  Culture/Microbiology    Component Value Date/Time   SDES BLOOD RIGHT ANTECUBITAL 02/12/2020 1942   SDES BLOOD LEFT ANTECUBITAL 02/12/2020 1942   SPECREQUEST  02/12/2020 1942    BOTTLES DRAWN AEROBIC AND ANAEROBIC Blood Culture adequate volume   SPECREQUEST  02/12/2020 1942    BOTTLES DRAWN AEROBIC AND ANAEROBIC Blood Culture adequate volume   CULT  02/12/2020 1942    NO GROWTH 2 DAYS Performed at [facility], [address] [place], [place] [address]    CULT  02/12/2020 1942    NO GROWTH 2 DAYS Performed at [facility], [address] [place], [place] [address]    REPTSTATUS PENDING 02/12/2020 1942   REPTSTATUS PENDING 02/12/2020 1942    Other culture-see note  Medications: Scheduled Meds: . aspirin EC  81 mg Oral Daily  . atorvastatin  40 mg Oral QHS  . enoxaparin (LOVENOX) injection  0.5 mg/kg Subcutaneous Q24H  . hydrochlorothiazide  25 mg Oral Daily  . insulin aspart  0-20 Units Subcutaneous Q4H  . insulin aspart  10 Units Subcutaneous TID WC  . insulin glargine  12 Units Subcutaneous BID  . Ipratropium-Albuterol  1 puff Inhalation Q6H  . lisinopril  40 mg Oral BH-q7a  . melatonin  10 mg Oral QHS  . methylPREDNISolone (SOLU-MEDROL) injection  40 mg Intravenous Q12H   Followed by  . [START ON 02/15/2020] predniSONE  50 mg Oral Daily  . omega-3 acid ethyl esters  1 g Oral Daily   Continuous Infusions: . remdesivir 100 mg in NS 100 mL 100 mg (02/14/20 0948)    Antimicrobials: Anti-infectives (From admission, onward)   Start     Dose/Rate Route Frequency Ordered Stop   02/13/20 1000  remdesivir 100 mg in sodium chloride 0.9 % 100 mL IVPB       "Followed by" Linked Group Details   100 mg 200 mL/hr over 30 Minutes Intravenous Daily 02/12/20 1817 02/17/20 0959   02/13/20 1000  remdesivir 100 mg in sodium chloride 0.9 % 100 mL IVPB  Status:  Discontinued       "Followed by" Linked Group Details   100 mg 200 mL/hr over 30 Minutes Intravenous Daily 02/12/20 2002 02/12/20 2005   02/12/20 2015  remdesivir 200 mg in sodium chloride 0.9% 250 mL IVPB  Status:  Discontinued       "Followed by" Linked Group Details   200 mg 580 mL/hr over 30 Minutes Intravenous Once 02/12/20 2002 02/12/20 2005   02/12/20 1930  remdesivir 200 mg in sodium chloride 0.9% 250 mL IVPB       "Followed by" Linked Group Details   200 mg 580 mL/hr over 30 Minutes Intravenous Once 02/12/20 1817 02/12/20 1954    02/12/20 1930  azithromycin (ZITHROMAX) 500 mg in sodium chloride 0.9 % 250 mL IVPB        500 mg 250 mL/hr over 60 Minutes Intravenous  Once 02/12/20 1919 02/12/20 2133     Objective: Vitals: Today's Vitals   02/14/20 0903 02/14/20 1020 02/14/20 1021 02/14/20 1022  BP: (!) 116/52  118/86   Pulse: 70 (!) 57    Resp: (!) 21 17 20 19   Temp: 97.6 F (36.4 C)     TempSrc: Oral     SpO2: 92% 94%  96%  Weight:      Height:      PainSc:       No intake or output data in the  24 hours ending 02/14/20 1050 Filed Weights   02/12/20 1502  Weight: 107.5 kg   Weight change:   Intake/Output from previous day: No intake/output data recorded. Intake/Output this shift: No intake/output data recorded.  Examination: General exam: AAOx3, obese,NAD, weak appearing. HEENT:Oral mucosa moist, Ear/Nose WNL grossly, dentition normal. Respiratory system: bilaterally diminished, wheezing+,no use of accessory muscle Cardiovascular system: S1 & S2 +, No JVD,. Gastrointestinal system: Abdomen soft, NT,ND, BS+ Nervous System:Alert, awake, moving extremities and grossly nonfocal Extremities: No edema, distal peripheral pulses palpable.  Skin: No rashes,no icterus. MSK: Normal muscle bulk,tone, power  Data Reviewed: I have personally reviewed following labs and imaging studies CBC: Recent Labs  Lab 02/12/20 1507 02/13/20 0619 02/14/20 0704  WBC 21.0* 20.6* 32.0*  NEUTROABS  --  18.1* 27.2*  HGB 14.5 13.9 14.0  HCT 43.0 40.8 41.2  MCV 88.7 88.7 88.6  PLT 907* 935* 1,610*   Basic Metabolic Panel: Recent Labs  Lab 02/12/20 1507 02/13/20 0619 02/14/20 0704  NA 133* 134* 134*  K 5.1 5.0 4.7  CL 99 100 101  CO2 22 23 22   GLUCOSE 442* 434* 271*  BUN 44* 52* 60*  CREATININE 1.38* 1.23 1.40*  CALCIUM 9.7 9.5 9.8  MG  --  2.5* 2.5*  PHOS  --  4.5 4.1   GFR: Estimated Creatinine Clearance: 56.7 mL/min (A) (by C-G formula based on SCr of 1.4 mg/dL (H)). Liver Function Tests: Recent Labs   Lab 02/13/20 0619 02/14/20 0704  AST 22 26  ALT 25 32  ALKPHOS 54 53  BILITOT 0.9 0.8  PROT 8.2* 7.9  ALBUMIN 2.9* 2.8*   No results for input(s): LIPASE, AMYLASE in the last 168 hours. No results for input(s): AMMONIA in the last 168 hours. Coagulation Profile: No results for input(s): INR, PROTIME in the last 168 hours. Cardiac Enzymes: No results for input(s): CKTOTAL, CKMB, CKMBINDEX, TROPONINI in the last 168 hours. BNP (last 3 results) No results for input(s): PROBNP in the last 8760 hours. HbA1C: No results for input(s): HGBA1C in the last 72 hours. CBG: Recent Labs  Lab 02/13/20 1934 02/13/20 2241 02/14/20 0008 02/14/20 0325 02/14/20 0901  GLUCAP 436* 376* 332* 313* 262*   Lipid Profile: No results for input(s): CHOL, HDL, LDLCALC, TRIG, CHOLHDL, LDLDIRECT in the last 72 hours. Thyroid Function Tests: No results for input(s): TSH, T4TOTAL, FREET4, T3FREE, THYROIDAB in the last 72 hours. Anemia Panel: Recent Labs    02/13/20 0619 02/14/20 0704  FERRITIN 919* 1,112*   Sepsis Labs: Recent Labs  Lab 02/12/20 1507 02/14/20 0704  PROCALCITON 0.14 <0.10    Recent Results (from the past 240 hour(s))  Blood culture (routine x 2)     Status: None (Preliminary result)   Collection Time: 02/12/20  7:42 PM   Specimen: BLOOD  Result Value Ref Range Status   Specimen Description BLOOD RIGHT ANTECUBITAL  Final   Special Requests   Final    BOTTLES DRAWN AEROBIC AND ANAEROBIC Blood Culture adequate volume   Culture   Final    NO GROWTH 2 DAYS Performed at [facility], [address], [place], [place] [address]    Report Status PENDING  Incomplete  Blood culture (routine x 2)     Status: None (Preliminary result)   Collection Time: 02/12/20  7:42 PM   Specimen: BLOOD  Result Value Ref Range Status   Specimen Description BLOOD LEFT ANTECUBITAL  Final   Special Requests   Final    BOTTLES DRAWN  AEROBIC AND ANAEROBIC Blood Culture adequate volume    Culture   Final    NO GROWTH 2 DAYS Performed at [facility], [address], [place], [place] [address]    Report Status PENDING  Incomplete     Radiology Studies: DG Chest Portable 1 View  Result Date: 02/12/2020 CLINICAL DATA:  Shortness of breath. EXAM: PORTABLE CHEST 1 VIEW COMPARISON:  05/16/2018 FINDINGS: There are diffuse bilateral hazy airspace opacities. No pneumothorax or large pleural effusion. The heart size is stable. Aortic calcifications are noted. There is no acute osseous abnormality. IMPRESSION: Diffuse bilateral hazy airspace opacities consistent with the patient's history of viral pneumonia. Electronically Signed   By: Constance Holster M.D.   On: 02/12/2020 18:27     LOS: 2 days   Antonieta Pert, MD [facility]  02/14/2020, 10:50 AM

## 2020-02-14 NOTE — ED Notes (Signed)
Georgina Snell in lab notified of need for venipuncture assist.

## 2020-02-14 NOTE — ED Notes (Signed)
Pt eating breakfast tray 

## 2020-02-14 NOTE — ED Notes (Signed)
Oxygen increased by 1lpm for pox of 88% on current flow.

## 2020-02-14 NOTE — ED Notes (Signed)
Patient sleeping at this time. No signs of distress. Respirations even and unlabored. Will continue to monitor. 

## 2020-02-15 DIAGNOSIS — U071 COVID-19: Secondary | ICD-10-CM | POA: Diagnosis not present

## 2020-02-15 LAB — CBC WITH DIFFERENTIAL/PLATELET
Abs Immature Granulocytes: 0.7 10*3/uL — ABNORMAL HIGH (ref 0.00–0.07)
Basophils Absolute: 0.1 10*3/uL (ref 0.0–0.1)
Basophils Relative: 0 %
Eosinophils Absolute: 0 10*3/uL (ref 0.0–0.5)
Eosinophils Relative: 0 %
HCT: 40 % (ref 39.0–52.0)
Hemoglobin: 13.8 g/dL (ref 13.0–17.0)
Immature Granulocytes: 2 %
Lymphocytes Relative: 4 %
Lymphs Abs: 1.1 10*3/uL (ref 0.7–4.0)
MCH: 30.1 pg (ref 26.0–34.0)
MCHC: 34.5 g/dL (ref 30.0–36.0)
MCV: 87.3 fL (ref 80.0–100.0)
Monocytes Absolute: 1.3 10*3/uL — ABNORMAL HIGH (ref 0.1–1.0)
Monocytes Relative: 4 %
Neutro Abs: 27.7 10*3/uL — ABNORMAL HIGH (ref 1.7–7.7)
Neutrophils Relative %: 90 %
Platelets: 1036 10*3/uL (ref 150–400)
RBC: 4.58 MIL/uL (ref 4.22–5.81)
RDW: 12.3 % (ref 11.5–15.5)
Smear Review: INCREASED
WBC: 30.9 10*3/uL — ABNORMAL HIGH (ref 4.0–10.5)
nRBC: 0.2 % (ref 0.0–0.2)

## 2020-02-15 LAB — COMPREHENSIVE METABOLIC PANEL
ALT: 34 U/L (ref 0–44)
AST: 27 U/L (ref 15–41)
Albumin: 2.7 g/dL — ABNORMAL LOW (ref 3.5–5.0)
Alkaline Phosphatase: 49 U/L (ref 38–126)
Anion gap: 11 (ref 5–15)
BUN: 56 mg/dL — ABNORMAL HIGH (ref 8–23)
CO2: 19 mmol/L — ABNORMAL LOW (ref 22–32)
Calcium: 9.4 mg/dL (ref 8.9–10.3)
Chloride: 103 mmol/L (ref 98–111)
Creatinine, Ser: 1.12 mg/dL (ref 0.61–1.24)
GFR, Estimated: >60 mL/min (ref >60)
Glucose, Bld: 285 mg/dL — ABNORMAL HIGH (ref 70–99)
Potassium: 5.5 mmol/L — ABNORMAL HIGH (ref 3.5–5.1)
Sodium: 133 mmol/L — ABNORMAL LOW (ref 135–145)
Total Bilirubin: 1.1 mg/dL (ref 0.3–1.2)
Total Protein: 7.4 g/dL (ref 6.5–8.1)

## 2020-02-15 LAB — RESP PANEL BY RT-PCR (FLU A&B, COVID) ARPGX2
Influenza A by PCR: NEGATIVE
Influenza B by PCR: NEGATIVE
SARS Coronavirus 2 by RT PCR: POSITIVE — AB

## 2020-02-15 LAB — C-REACTIVE PROTEIN: CRP: 5.4 mg/dL — ABNORMAL HIGH (ref <1.0)

## 2020-02-15 LAB — BASIC METABOLIC PANEL
Anion gap: 12 (ref 5–15)
BUN: 53 mg/dL — ABNORMAL HIGH (ref 8–23)
CO2: 22 mmol/L (ref 22–32)
Calcium: 9.4 mg/dL (ref 8.9–10.3)
Chloride: 100 mmol/L (ref 98–111)
Creatinine, Ser: 1.25 mg/dL — ABNORMAL HIGH (ref 0.61–1.24)
GFR, Estimated: >60 mL/min (ref >60)
Glucose, Bld: 291 mg/dL — ABNORMAL HIGH (ref 70–99)
Potassium: 4.9 mmol/L (ref 3.5–5.1)
Sodium: 134 mmol/L — ABNORMAL LOW (ref 135–145)

## 2020-02-15 LAB — FERRITIN: Ferritin: 758 ng/mL — ABNORMAL HIGH (ref 24–336)

## 2020-02-15 LAB — CBG MONITORING, ED
Glucose-Capillary: 281 mg/dL — ABNORMAL HIGH (ref 70–99)
Glucose-Capillary: 242 mg/dL — ABNORMAL HIGH (ref 70–99)
Glucose-Capillary: 271 mg/dL — ABNORMAL HIGH (ref 70–99)
Glucose-Capillary: 328 mg/dL — ABNORMAL HIGH (ref 70–99)

## 2020-02-15 LAB — GLUCOSE, CAPILLARY: Glucose-Capillary: 266 mg/dL — ABNORMAL HIGH (ref 70–99)

## 2020-02-15 LAB — PHOSPHORUS: Phosphorus: 3.8 mg/dL (ref 2.5–4.6)

## 2020-02-15 LAB — PATHOLOGIST SMEAR REVIEW

## 2020-02-15 LAB — PROCALCITONIN: Procalcitonin: <0.1 ng/mL

## 2020-02-15 LAB — D-DIMER, QUANTITATIVE: D-Dimer, Quant: 0.96 ug/mL-FEU — ABNORMAL HIGH (ref 0.00–0.50)

## 2020-02-15 LAB — MAGNESIUM: Magnesium: 2.2 mg/dL (ref 1.7–2.4)

## 2020-02-15 MED ORDER — SODIUM ZIRCONIUM CYCLOSILICATE 5 G PO PACK
5.0000 g | PACK | Freq: Once | ORAL | Status: AC
  Administered 2020-02-15: 5 g via ORAL
  Filled 2020-02-15: qty 1

## 2020-02-15 NOTE — Plan of Care (Signed)
  Problem: Education: Goal: Knowledge of risk factors and measures for prevention of condition will improve Outcome: Progressing   Problem: Coping: Goal: Psychosocial and spiritual needs will be supported Outcome: Progressing   Problem: Respiratory: Goal: Will maintain a patent airway Outcome: Progressing Goal: Complications related to the disease process, condition or treatment will be avoided or minimized Outcome: Progressing   

## 2020-02-15 NOTE — Progress Notes (Signed)
Received pt from ER, transported via wheelchair. Pt is aox4, MAE, and is DOE. Placed on 5LHF and oxygen saturation increased to 95%. Fluids offered. Call bell placed within reach. Orientation given. Will monitor.

## 2020-02-15 NOTE — ED Notes (Signed)
Patient standing at side of bed to use urinal. O2 saturations decreased to 83% upon standing with 6L O2 via [place] still in place, patient is [facility] with standing and heart rate increases to 110s.  Patient's sats increased to 92% after 5 minutes of focusing on breathing, HR decreased to 66. Patient voided 393mL

## 2020-02-15 NOTE — ED Notes (Signed)
Pt given dinner tray. Pt denies any further needs at this time. Pt is A&OX4. NAD noted. Call bell in reach.

## 2020-02-15 NOTE — Progress Notes (Signed)
PROGRESS NOTE    Brent [name].  NGE:[id number] DOB: 1945-12-31 DOA: 02/12/2020 PCP: [name], MD   Chief Complaint  Patient presents with  . 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  History of Moderna COVID-vaccine x2 last year, and has not had booster yet. In the ED saturating well on 3 L nasal cannula chest x-ray with diffuse bilateral multifocal pneumonia lab work with hyperglycemia and leukocytosis Patient was started on remdesivir and steroid supplemental oxygen and admitted for further management Patient had uncontrolled hyperglycemia-insulin regimen adjusted.  Subjective: Seen this morning.  He was trying to have his breakfast.  He reports he feels overall better and seems to be improving.  He is on 6 L nasal cannula about the same like yesterday Reports he has good appetite and eating better.  Assessment & Plan:  Acute hypoxic respiratory failure due to COVID-19 pneumonia/COVID-19 infection with multifocal pneumonia.Tested positive on 12/24.Patient is considered high risk due to his age, diabetes, obesity, immunocompromise due to spleenectomy.  On 6 L nasal cannula is stable, will continue the same continue with IV remdesivir, oral prednisone (completed iv solumedrol) . Cont to monitor blood glucose, inflammatory markers as below with CRP nicely improving. COVID-19 Labs Recent Labs    02/12/20 2001 02/12/20 2013 02/12/20 2013 02/13/20 4010 02/14/20 0704 02/15/20 0409 02/15/20 0413  DDIMER 1.31*  --   --   --  0.83*  --   --   FERRITIN  --  863*   < > 919* 1,112* 758*  --   LDH  --  210*  --   --   --    --   --   CRP  --  18.9*   < > 19.3* 11.1*  --  5.4*   < > = values in this interval not displayed.   Diabetes mellitus with uncontrolled hyperglycemia, in the setting of stress/steroid: Hemoglobin A1c 8.1.  Blood sugar improving, continue q4h SSI, Novolog 10 units TID AC, and lantus 12 units BID.Cont home metformin and adjust insulin regimen.  Recent Labs  Lab 02/14/20 2145 02/14/20 2305 02/15/20 0503 02/15/20 0824 02/15/20 1154  GLUCAP 173* 182* 281* 242* 271*   Hypertension/HLD:BP well controlled on home HCTZ, hold ACE inhibitor today due to hyperkalemia.    Hyperkalemia we will try Lokelma low-dose x1.  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  WBC uptrending 30 K likely in the setting of steroid as procalcitonin is reassuring <0.1.  Morbid obesity with BMI 36: Will benefit with weight loss.  Nutrition: Diet Order            Diet heart healthy/carb modified Room service appropriate? Yes; Fluid consistency: Thin  Diet effective now                 Body mass index is 36.03 kg/m.  DVT prophylaxis: Lovenox Code Status:   Code Status: Full Code  Family Communication: plan of care discussed with patient at bedside. I had called and updated patient's wife.  He is self updating.  Status is: Inpatient Remains inpatient appropriate because:IV treatments appropriate due to intensity of illness or inability to  take PO and Inpatient level of care appropriate due to severity of illness  Dispo: The patient is from: Home              Anticipated d/c is to: Home              Anticipated d/c date is:2-3 days              Patient currently is not medically stable to d/c. Consultants:see note  Procedures:see note  Culture/Microbiology    Component Value Date/Time   SDES BLOOD RIGHT ANTECUBITAL 02/12/2020 1942   SDES BLOOD LEFT ANTECUBITAL 02/12/2020 1942   SPECREQUEST  02/12/2020  1942    BOTTLES DRAWN AEROBIC AND ANAEROBIC Blood Culture adequate volume   SPECREQUEST  02/12/2020 1942    BOTTLES DRAWN AEROBIC AND ANAEROBIC Blood Culture adequate volume   CULT  02/12/2020 1942    NO GROWTH 3 DAYS Performed at [facility], [address] [place], [place] [address]    CULT  02/12/2020 1942    NO GROWTH 3 DAYS Performed at [facility], [address] [place], [place] [address]    REPTSTATUS PENDING 02/12/2020 1942   REPTSTATUS PENDING 02/12/2020 1942    Other culture-see note  Medications: Scheduled Meds: . aspirin EC  81 mg Oral Daily  . atorvastatin  40 mg Oral QHS  . enoxaparin (LOVENOX) injection  0.5 mg/kg Subcutaneous Q24H  . hydrochlorothiazide  25 mg Oral Daily  . insulin aspart  0-20 Units Subcutaneous Q4H  . insulin aspart  10 Units Subcutaneous TID WC  . insulin glargine  12 Units Subcutaneous BID  . Ipratropium-Albuterol  1 puff Inhalation Q6H  . lisinopril  40 mg Oral BH-q7a  . melatonin  10 mg Oral QHS  . omega-3 acid ethyl esters  1 g Oral Daily  . predniSONE  50 mg Oral Daily  . sodium zirconium cyclosilicate  5 g Oral Once   Continuous Infusions: . remdesivir 100 mg in NS 100 mL 100 mg (02/15/20 1101)    Antimicrobials: Anti-infectives (From admission, onward)   Start     Dose/Rate Route Frequency Ordered Stop   02/13/20 1000  remdesivir 100 mg in sodium chloride 0.9 % 100 mL IVPB       "Followed by" Linked Group Details   100 mg 200 mL/hr over 30 Minutes Intravenous Daily 02/12/20 1817 02/17/20 0959   02/13/20 1000  remdesivir 100 mg in sodium chloride 0.9 % 100 mL IVPB  Status:  Discontinued       "Followed by" Linked Group Details   100 mg 200 mL/hr over 30 Minutes Intravenous Daily 02/12/20 2002 02/12/20 2005   02/12/20 2015  remdesivir 200 mg in sodium chloride 0.9% 250 mL IVPB  Status:  Discontinued       "Followed by" Linked Group Details   200 mg 580 mL/hr over 30 Minutes Intravenous Once 02/12/20  2002 02/12/20 2005   02/12/20 1930  remdesivir 200 mg in sodium chloride 0.9% 250 mL IVPB       "Followed by" Linked Group Details   200 mg 580 mL/hr over 30 Minutes Intravenous Once 02/12/20 1817 02/12/20 1954   02/12/20 1930  azithromycin (ZITHROMAX) 500 mg in sodium chloride 0.9 % 250 mL IVPB        500 mg 250 mL/hr over 60 Minutes Intravenous  Once 02/12/20 1919 02/12/20 2133     Objective: Vitals: Today's Vitals   02/15/20 0700 02/15/20 0800 02/15/20 1000 02/15/20 1030  BP: (!) 109/47 110/62 (!) 113/59 (!) 113/58  Pulse: 65 (!) 59 (!) 59 63  Resp: 18 15 18 17   Temp:      TempSrc:      SpO2: 93% 93% 94% 93%  Weight:      Height:      PainSc:        Intake/Output Summary (Last 24 hours) at 02/15/2020 1207 Last data filed at 02/15/2020 [id number] Gross per 24 hour  Intake 210 ml  Output 650 ml  Net -440 ml   Filed Weights   02/12/20 1502  Weight: 107.5 kg   Weight change:   Intake/Output from previous day: 01/09 0701 - 01/10 0700 In: 407.1 [P.O.:210; IV Piggyback:197.1] Out: 350 [Urine:350] Intake/Output this shift: Total I/O In: -  Out: 300 [Urine:300]  Examination: General exam: AAOx3, obese , NAD, weak appearing. HEENT:Oral mucosa moist, Ear/Nose WNL grossly, dentition normal. Respiratory system: bilaterally diminished,no use of accessory muscle Cardiovascular system: S1 & S2 +, No JVD,. Gastrointestinal system: Abdomen soft, NT,ND, BS+ Nervous System:Alert, awake, moving extremities and grossly nonfocal Extremities: No edema, distal peripheral pulses palpable.  Skin: No rashes,no icterus. MSK: Normal muscle bulk,tone, power  Data Reviewed: I have personally reviewed following labs and imaging studies CBC: Recent Labs  Lab 02/12/20 1507 02/13/20 0619 02/14/20 0704 02/15/20 0409  WBC 21.0* 20.6* 32.0* 30.9*  NEUTROABS  --  18.1* 27.2* 27.7*  HGB 14.5 13.9 14.0 13.8  HCT 43.0 40.8 41.2 40.0  MCV 88.7 88.7 88.6 87.3  PLT 907* 935* 1,070* 1,036*    Basic Metabolic Panel: Recent Labs  Lab 02/12/20 1507 02/13/20 0619 02/14/20 0704 02/15/20 0409  NA 133* 134* 134* 133*  K 5.1 5.0 4.7 5.5*  CL 99 100 101 103  CO2 22 23 22  19*  GLUCOSE 442* 434* 271* 285*  BUN 44* 52* 60* 56*  CREATININE 1.38* 1.23 1.40* 1.12  CALCIUM 9.7 9.5 9.8 9.4  MG  --  2.5* 2.5* 2.2  PHOS  --  4.5 4.1 3.8   GFR: Estimated Creatinine Clearance: 70.8 mL/min (by C-G formula based on SCr of 1.12 mg/dL). Liver Function Tests: Recent Labs  Lab 02/13/20 0619 02/14/20 0704 02/15/20 0409  AST 22 26 27   ALT 25 32 34  ALKPHOS 54 53 49  BILITOT 0.9 0.8 1.1  PROT 8.2* 7.9 7.4  ALBUMIN 2.9* 2.8* 2.7*   No results for input(s): LIPASE, AMYLASE in the last 168 hours. No results for input(s): AMMONIA in the last 168 hours. Coagulation Profile: No results for input(s): INR, PROTIME in the last 168 hours. Cardiac Enzymes: No results for input(s): CKTOTAL, CKMB, CKMBINDEX, TROPONINI in the last 168 hours. BNP (last 3 results) No results for input(s): PROBNP in the last 8760 hours. HbA1C: Recent Labs    02/13/20 0704  HGBA1C 8.1*   CBG: Recent Labs  Lab 02/14/20 2145 02/14/20 2305 02/15/20 0503 02/15/20 0824 02/15/20 1154  GLUCAP 173* 182* 281* 242* 271*   Lipid Profile: No results for input(s): CHOL, HDL, LDLCALC, TRIG, CHOLHDL, LDLDIRECT in the last 72 hours. Thyroid Function Tests: No results for input(s): TSH, T4TOTAL, FREET4, T3FREE, THYROIDAB in the last 72 hours. Anemia Panel: Recent Labs    02/14/20 0704 02/15/20 0409  FERRITIN 1,112* 758*   Sepsis Labs: Recent Labs  Lab 02/12/20 1507 02/14/20 0704 02/15/20 0409  PROCALCITON 0.14 <0.10 <0.10    Recent Results (from the past 240 hour(s))  Blood culture (routine x 2)     Status: None (Preliminary  result)   Collection Time: 02/12/20  7:42 PM   Specimen: BLOOD  Result Value Ref Range Status   Specimen Description BLOOD RIGHT ANTECUBITAL  Final   Special Requests   Final     BOTTLES DRAWN AEROBIC AND ANAEROBIC Blood Culture adequate volume   Culture   Final    NO GROWTH 3 DAYS Performed at [facility], [address], [place], [place] [address]    Report Status PENDING  Incomplete  Blood culture (routine x 2)     Status: None (Preliminary result)   Collection Time: 02/12/20  7:42 PM   Specimen: BLOOD  Result Value Ref Range Status   Specimen Description BLOOD LEFT ANTECUBITAL  Final   Special Requests   Final    BOTTLES DRAWN AEROBIC AND ANAEROBIC Blood Culture adequate volume   Culture   Final    NO GROWTH 3 DAYS Performed at [facility], [address] [place], [place] [address]    Report Status PENDING  Incomplete     Radiology Studies: No results found.   LOS: 3 days   Antonieta Pert, MD [facility]  02/15/2020, 12:07 PM

## 2020-02-16 DIAGNOSIS — N179 Acute kidney failure, unspecified: Secondary | ICD-10-CM

## 2020-02-16 DIAGNOSIS — J9601 Acute respiratory failure with hypoxia: Secondary | ICD-10-CM | POA: Diagnosis not present

## 2020-02-16 DIAGNOSIS — U071 COVID-19: Secondary | ICD-10-CM | POA: Diagnosis not present

## 2020-02-16 LAB — COMPREHENSIVE METABOLIC PANEL
ALT: 30 U/L (ref 0–44)
AST: 21 U/L (ref 15–41)
Albumin: 2.8 g/dL — ABNORMAL LOW (ref 3.5–5.0)
Alkaline Phosphatase: 52 U/L (ref 38–126)
Anion gap: 11 (ref 5–15)
BUN: 51 mg/dL — ABNORMAL HIGH (ref 8–23)
CO2: 23 mmol/L (ref 22–32)
Calcium: 9.6 mg/dL (ref 8.9–10.3)
Chloride: 101 mmol/L (ref 98–111)
Creatinine, Ser: 1.26 mg/dL — ABNORMAL HIGH (ref 0.61–1.24)
GFR, Estimated: >60 mL/min (ref >60)
Glucose, Bld: 226 mg/dL — ABNORMAL HIGH (ref 70–99)
Potassium: 5.2 mmol/L — ABNORMAL HIGH (ref 3.5–5.1)
Sodium: 135 mmol/L (ref 135–145)
Total Bilirubin: 1.2 mg/dL (ref 0.3–1.2)
Total Protein: 7.6 g/dL (ref 6.5–8.1)

## 2020-02-16 LAB — CBC WITH DIFFERENTIAL/PLATELET
Abs Immature Granulocytes: 0.41 10*3/uL — ABNORMAL HIGH (ref 0.00–0.07)
Basophils Absolute: 0.1 10*3/uL (ref 0.0–0.1)
Basophils Relative: 0 %
Eosinophils Absolute: 0 10*3/uL (ref 0.0–0.5)
Eosinophils Relative: 0 %
HCT: 43.8 % (ref 39.0–52.0)
Hemoglobin: 14.5 g/dL (ref 13.0–17.0)
Immature Granulocytes: 2 %
Lymphocytes Relative: 5 %
Lymphs Abs: 1.3 10*3/uL (ref 0.7–4.0)
MCH: 29.5 pg (ref 26.0–34.0)
MCHC: 33.1 g/dL (ref 30.0–36.0)
MCV: 89 fL (ref 80.0–100.0)
Monocytes Absolute: 1.3 10*3/uL — ABNORMAL HIGH (ref 0.1–1.0)
Monocytes Relative: 5 %
Neutro Abs: 23.1 10*3/uL — ABNORMAL HIGH (ref 1.7–7.7)
Neutrophils Relative %: 88 %
Platelets: 1104 10*3/uL (ref 150–400)
RBC: 4.92 MIL/uL (ref 4.22–5.81)
RDW: 12.3 % (ref 11.5–15.5)
Smear Review: NORMAL
WBC: 26.1 10*3/uL — ABNORMAL HIGH (ref 4.0–10.5)
nRBC: 0.1 % (ref 0.0–0.2)

## 2020-02-16 LAB — GLUCOSE, CAPILLARY
Glucose-Capillary: 126 mg/dL — ABNORMAL HIGH (ref 70–99)
Glucose-Capillary: 131 mg/dL — ABNORMAL HIGH (ref 70–99)
Glucose-Capillary: 226 mg/dL — ABNORMAL HIGH (ref 70–99)
Glucose-Capillary: 228 mg/dL — ABNORMAL HIGH (ref 70–99)
Glucose-Capillary: 312 mg/dL — ABNORMAL HIGH (ref 70–99)
Glucose-Capillary: 316 mg/dL — ABNORMAL HIGH (ref 70–99)
Glucose-Capillary: 350 mg/dL — ABNORMAL HIGH (ref 70–99)

## 2020-02-16 LAB — D-DIMER, QUANTITATIVE: D-Dimer, Quant: 1.39 ug/mL-FEU — ABNORMAL HIGH (ref 0.00–0.50)

## 2020-02-16 LAB — C-REACTIVE PROTEIN: CRP: 2.3 mg/dL — ABNORMAL HIGH (ref <1.0)

## 2020-02-16 LAB — FERRITIN: Ferritin: 520 ng/mL — ABNORMAL HIGH (ref 24–336)

## 2020-02-16 LAB — PHOSPHORUS: Phosphorus: 4.4 mg/dL (ref 2.5–4.6)

## 2020-02-16 LAB — MAGNESIUM: Magnesium: 2.3 mg/dL (ref 1.7–2.4)

## 2020-02-16 MED ORDER — SODIUM CHLORIDE 0.9 % IV SOLN
INTRAVENOUS | Status: DC | PRN
  Administered 2020-02-16: 250 mL via INTRAVENOUS

## 2020-02-16 MED ORDER — MELATONIN 3 MG PO TABS
9.0000 mg | ORAL_TABLET | Freq: Every day | ORAL | Status: DC
Start: 2020-02-16 — End: 2020-02-18
  Administered 2020-02-16 – 2020-02-17 (×2): 9 mg via ORAL
  Filled 2020-02-16 (×4): qty 3

## 2020-02-16 MED ORDER — PATIROMER SORBITEX CALCIUM 8.4 G PO PACK
8.4000 g | PACK | Freq: Every day | ORAL | Status: DC
  Administered 2020-02-16: 11:00:00 8.4 g via ORAL
  Filled 2020-02-16 (×2): qty 1

## 2020-02-16 NOTE — Evaluation (Signed)
Occupational Therapy Evaluation Patient Details Name: Brent [name]. MRN: [id number] DOB: 1945/03/04 Today's Date: 02/16/2020    History of Present Illness 75yo male pt with PMHx of HTN, DM, HLD, hx splenectomy for spherocytosis with chronic leukocytosis, chronic thrombocytosis, presented with cough and SOB. + Covid-19 on 01/29/20. Admitted for acute hypoxic respiratory failure due to Covid-19 pneumonia infection with multifocal pneumonia.   Clinical Impression   Pt was seen for OT evaluation this date. Prior to hospital admission, pt was independent, driving, and no falls. Pt lives with his spouse in a [facility] with 3 STE and no rails (reports scheduled to get bilat rails put in soon). Currently pt on 5L O2 at start of session and denied SOB. Pt indep with bed mobility and SBA for ADL transfers, with pt reaching out for UE support on bed rail when transitioning from bed to recliner but no LOB noted. MD in room for brief assessment, provided verbal order to begin weaning O2. Pt placed on 4L and then 3L O2 and pt remained with SpO2 at 92-94%. Pt left on 3L and RN made aware. Do not anticipate additional need for skilled OT services at this time once pt returns to baseline room air. Pt in agreement. Will sign off.     Follow Up Recommendations  No OT follow up    Equipment Recommendations  None recommended by OT    Recommendations for Other Services       Precautions / Restrictions Precautions Precautions: Fall Restrictions Weight Bearing Restrictions: No      Mobility Bed Mobility Overal bed mobility: Modified Independent                  Transfers Overall transfer level: Needs assistance Equipment used: None Transfers: Sit to/from Stand Sit to Stand: Min guard         General transfer comment: noted to reach out for bed rail afterwards, mildly unsteady    Balance Overall balance assessment: Mild deficits observed, not formally tested                                          ADL either performed or assessed with clinical judgement   ADL Overall ADL's : Modified independent                                       General ADL Comments: increased time and brief rest breaks for breathing, but otherwise sup-CGA for ADL transfers; close to baseline     Vision Baseline Vision/History: No visual deficits Patient Visual Report: No change from baseline       Perception     Praxis      Pertinent Vitals/Pain Pain Assessment: No/denies pain     Hand Dominance Right   Extremity/Trunk Assessment Upper Extremity Assessment Upper Extremity Assessment: Overall WFL for tasks assessed   Lower Extremity Assessment Lower Extremity Assessment: Defer to PT evaluation;Generalized weakness   Cervical / Trunk Assessment Cervical / Trunk Assessment: Normal   Communication Communication Communication: No difficulties   Cognition Arousal/Alertness: Awake/alert Behavior During Therapy: WFL for tasks assessed/performed Overall Cognitive Status: Within Functional Limits for tasks assessed  General Comments  5L O2 at start, SpO2 91-92%, w/ MD approval (verbal in person), weaned to 3L O2, SpO2 remains 91-93%, left on 3L. RN aware    Exercises Other Exercises Other Exercises: Pt instructed in ECS, PLB, falls   Shoulder Instructions      Home Living Family/patient expects to be discharged to:: Private residence Living Arrangements: Spouse/significant other Available Help at Discharge: Family;Available 24 hours/day Type of Home: House Home Access: Stairs to enter [employer] of Steps: 3, no rails but going to be putting up bilat rails soon   Home Layout: One level     Bathroom Shower/Tub: Teacher, early years/pre: Standard     Home Equipment: None          Prior Functioning/Environment Level of Independence: Independent        Comments:  indep, drives, no falls        OT Problem List: Decreased strength;Decreased activity tolerance      OT Treatment/Interventions:      OT Goals(Current goals can be found in the care plan section) Acute Rehab OT Goals Patient Stated Goal: get better adn go home OT Goal Formulation: All assessment and education complete, DC therapy  OT Frequency:     Barriers to D/C:            Co-evaluation              AM-PAC OT "6 Clicks" Daily Activity     Outcome Measure Help from another person eating meals?: None Help from another person taking care of personal grooming?: None Help from another person toileting, which includes using toliet, bedpan, or urinal?: None Help from another person bathing (including washing, rinsing, drying)?: None Help from another person to put on and taking off regular upper body clothing?: None Help from another person to put on and taking off regular lower body clothing?: None 6 Click Score: 24   End of Session Equipment Utilized During Treatment: Oxygen Nurse Communication: Mobility status;Other (comment) (O2)  Activity Tolerance: Patient tolerated treatment well Patient left: in chair;with call bell/phone within reach  OT Visit Diagnosis: Other abnormalities of gait and mobility (R26.89);Muscle weakness (generalized) (M62.81)                Time: 7989-2119 OT Time Calculation (min): 44 min Charges:  OT General Charges $OT Visit: 1 Visit OT Evaluation $OT Eval Moderate Complexity: 1 Mod OT Treatments $Self Care/Home Management : 8-22 mins  Jeni Salles, MPH, MS, OTR/L ascom [phone] 02/16/20, 12:53 PM

## 2020-02-16 NOTE — Progress Notes (Signed)
PROGRESS NOTE    Brent [name].  ZJQ:[id number] DOB: Sep 28, 1945 DOA: 02/12/2020 PCP: [name], MD  Assessment & Plan:   Active Problems:   COVID-19 virus infection   COVID-19  Acute hypoxic respiratory failure: secondary to COVID-19 pneumonia. Continue on IV steroids and bronchodilators. Completed remdesivir course. Encourage incentive spirometry Continue on supplemental oxygen and wean as tolerated  DM2: uncontrolled w/ HbA1c 8.1. Continue on lantus, novolog w/ meals & SSI w/ accuchecks.   HTN: continue on home dose of lisinopril, HCTZ  Hyperkalemia: started on veltassa   AKI: Cr is trending up slightly from day prior. Baseline Cr/GFR is unknown. 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   Morbid obesity: BMI 36.0. Complicates overall care and prognosis    DVT prophylaxis: lovenox  Code Status: full Family Communication:  Disposition Plan: depends on PT/OT recs  Status is: Inpatient  Remains inpatient appropriate because:Unsafe d/c plan, IV treatments appropriate due to intensity of illness or inability to take PO and Inpatient level of care appropriate due to severity of illness   Dispo: The patient is from: Home              Anticipated d/c is to: SNF              Anticipated d/c date is: > 3 days              Patient currently is not medically stable to d/c.         Consultants:      Procedures:    Antimicrobials:    Subjective: Pt c/o shortness of breath   Objective: Vitals:   02/15/20 1944 02/16/20 0009 02/16/20 0454 02/16/20 0747  BP: 140/70 125/78 111/72 128/69  Pulse: 62 64 62 61  Resp: 18 20 18 16   Temp: 98.1 F (36.7 C) 98.1 F (36.7 C) 98 F (36.7 C) 97.9 F (36.6 C)  TempSrc:      SpO2: 94% 93% 91% 95%  Weight:      Height:        Intake/Output Summary (Last 24 hours) at 02/16/2020 0753 Last data filed at 02/16/2020 0350 Gross per 24 hour   Intake 225.88 ml  Output 1100 ml  Net -874.12 ml   Filed Weights   02/12/20 1502  Weight: 107.5 kg    Examination:  General exam: Appears calm and comfortable  Respiratory system: decreased breath sounds b/l  Cardiovascular system: S1 & S2 +. No rubs, gallops or clicks.  Gastrointestinal system: Abdomen is nondistended, soft and nontender. Normal bowel sounds heard. Central nervous system: Alert and oriented. Moves all 4 extremities  Psychiatry: Judgement and insight appear normal.Flat mood and affect    Data Reviewed: I have personally reviewed following labs and imaging studies  CBC: Recent Labs  Lab 02/12/20 1507 02/13/20 0619 02/14/20 0704 02/15/20 0409 02/16/20 0507  WBC 21.0* 20.6* 32.0* 30.9* 26.1*  NEUTROABS  --  18.1* 27.2* 27.7* PENDING  HGB 14.5 13.9 14.0 13.8 14.5  HCT 43.0 40.8 41.2 40.0 43.8  MCV 88.7 88.7 88.6 87.3 89.0  PLT 907* 935* 1,070* 1,036* 2,409*   Basic Metabolic Panel: Recent Labs  Lab 02/13/20 0619 02/14/20 0704 02/15/20 0409 02/15/20 2002 02/16/20 0507  NA 134* 134* 133* 134* 135  K 5.0 4.7 5.5* 4.9 5.2*  CL 100 101 103 100 101  CO2 23 22 19* 22 23  GLUCOSE 434* 271* 285* 291* 226*  BUN 52*  60* 56* 53* 51*  CREATININE 1.23 1.40* 1.12 1.25* 1.26*  CALCIUM 9.5 9.8 9.4 9.4 9.6  MG 2.5* 2.5* 2.2  --  2.3  PHOS 4.5 4.1 3.8  --  4.4   GFR: Estimated Creatinine Clearance: 63 mL/min (A) (by C-G formula based on SCr of 1.26 mg/dL (H)). Liver Function Tests: Recent Labs  Lab 02/13/20 0619 02/14/20 0704 02/15/20 0409 02/16/20 0507  AST 22 26 27 21   ALT 25 32 34 30  ALKPHOS 54 53 49 52  BILITOT 0.9 0.8 1.1 1.2  PROT 8.2* 7.9 7.4 7.6  ALBUMIN 2.9* 2.8* 2.7* 2.8*   No results for input(s): LIPASE, AMYLASE in the last 168 hours. No results for input(s): AMMONIA in the last 168 hours. Coagulation Profile: No results for input(s): INR, PROTIME in the last 168 hours. Cardiac Enzymes: No results for input(s): CKTOTAL, CKMB,  CKMBINDEX, TROPONINI in the last 168 hours. BNP (last 3 results) No results for input(s): PROBNP in the last 8760 hours. HbA1C: No results for input(s): HGBA1C in the last 72 hours. CBG: Recent Labs  Lab 02/15/20 1154 02/15/20 1614 02/15/20 2044 02/16/20 0008 02/16/20 0452  GLUCAP 271* 328* 266* 131* 228*   Lipid Profile: No results for input(s): CHOL, HDL, LDLCALC, TRIG, CHOLHDL, LDLDIRECT in the last 72 hours. Thyroid Function Tests: No results for input(s): TSH, T4TOTAL, FREET4, T3FREE, THYROIDAB in the last 72 hours. Anemia Panel: Recent Labs    02/15/20 0409 02/16/20 0507  FERRITIN 758* 520*   Sepsis Labs: Recent Labs  Lab 02/12/20 1507 02/14/20 0704 02/15/20 0409  PROCALCITON 0.14 <0.10 <0.10    Recent Results (from the past 240 hour(s))  Blood culture (routine x 2)     Status: None (Preliminary result)   Collection Time: 02/12/20  7:42 PM   Specimen: BLOOD  Result Value Ref Range Status   Specimen Description BLOOD RIGHT ANTECUBITAL  Final   Special Requests   Final    BOTTLES DRAWN AEROBIC AND ANAEROBIC Blood Culture adequate volume   Culture   Final    NO GROWTH 3 DAYS Performed at [facility], [address] [place], [place] [address]    Report Status PENDING  Incomplete  Blood culture (routine x 2)     Status: None (Preliminary result)   Collection Time: 02/12/20  7:42 PM   Specimen: BLOOD  Result Value Ref Range Status   Specimen Description BLOOD LEFT ANTECUBITAL  Final   Special Requests   Final    BOTTLES DRAWN AEROBIC AND ANAEROBIC Blood Culture adequate volume   Culture   Final    NO GROWTH 3 DAYS Performed at [facility], [address], [place], [place] [address]    Report Status PENDING  Incomplete  Resp Panel by RT-PCR (Flu A&B, Covid) Nasopharyngeal Swab     Status: Abnormal   Collection Time: 02/15/20  5:52 PM   Specimen: Nasopharyngeal Swab; Nasopharyngeal(NP) swabs in vial transport medium  Result Value Ref  Range Status   SARS Coronavirus 2 by RT PCR POSITIVE (A) NEGATIVE Final    Comment: RESULT CALLED TO, READ BACK BY AND VERIFIED WITH: JACKIE RN 02/15/20 AT 1901 BY ACR (NOTE) SARS-CoV-2 target nucleic acids are DETECTED.  The SARS-CoV-2 RNA is generally detectable in upper respiratory specimens during the acute phase of infection. Positive results are indicative of the presence of the identified virus, but do not rule out bacterial infection or co-infection with other pathogens not detected by the test. Clinical correlation with  patient history and other diagnostic information is necessary to determine patient infection status. The expected result is Negative.  Fact Sheet for Patients: EntrepreneurPulse.com.au  Fact Sheet for Healthcare Providers: IncredibleEmployment.be  This test is not yet approved or cleared by the [place] FDA and  has been authorized for detection and/or diagnosis of SARS-CoV-2 by FDA under an Emergency Use Authorization (EUA).  This EUA will remain in effect (meaning this test can be  used) for the duration of  the COVID-19 declaration under Section 564(b)(1) of the Act, 21 U.S.C. section 360bbb-3(b)(1), unless the authorization is terminated or revoked sooner.     Influenza A by PCR NEGATIVE NEGATIVE Final   Influenza B by PCR NEGATIVE NEGATIVE Final    Comment: (NOTE) The Xpert Xpress SARS-CoV-2/FLU/RSV plus assay is intended as an aid in the diagnosis of influenza from Nasopharyngeal swab specimens and should not be used as a sole basis for treatment. Nasal washings and aspirates are unacceptable for Xpert Xpress SARS-CoV-2/FLU/RSV testing.  Fact Sheet for Patients: EntrepreneurPulse.com.au  Fact Sheet for Healthcare Providers: IncredibleEmployment.be  This test is not yet approved or cleared by the [place] FDA and has been authorized for detection and/or diagnosis  of SARS-CoV-2 by FDA under an Emergency Use Authorization (EUA). This EUA will remain in effect (meaning this test can be used) for the duration of the COVID-19 declaration under Section 564(b)(1) of the Act, 21 U.S.C. section 360bbb-3(b)(1), unless the authorization is terminated or revoked.  Performed at [facility], [address] [place], [place] [address]          Radiology Studies: No results found.      Scheduled Meds: . aspirin EC  81 mg Oral Daily  . atorvastatin  40 mg Oral QHS  . enoxaparin (LOVENOX) injection  0.5 mg/kg Subcutaneous Q24H  . hydrochlorothiazide  25 mg Oral Daily  . insulin aspart  0-20 Units Subcutaneous Q4H  . insulin aspart  10 Units Subcutaneous TID WC  . insulin glargine  12 Units Subcutaneous BID  . Ipratropium-Albuterol  1 puff Inhalation Q6H  . lisinopril  40 mg Oral BH-q7a  . melatonin  10 mg Oral QHS  . omega-3 acid ethyl esters  1 g Oral Daily  . predniSONE  50 mg Oral Daily   Continuous Infusions: . remdesivir 100 mg in NS 100 mL Stopped (02/15/20 1213)     LOS: 4 days    Time spent: 32 mins     Wyvonnia Dusky, MD [facility] Pager [phone]  If 7PM-7AM, please contact night-coverage 02/16/2020, 7:53 AM

## 2020-02-16 NOTE — Evaluation (Signed)
Physical Therapy Evaluation Patient Details Name: Brent [name]. MRN: [id number] DOB: [birth year] Today's Date: 02/16/2020   History of Present Illness  Pt is a 75 y.o. male presenting to hospital 1/7 with SOB and hypoxemia; recent COVID diagnosis 01/29/20.  Pt admitted with acute hypoxic respiratory failure secondary COVID-19 PNA, IDDM with hyperglycemia, thrombocytosis, and leukocytosis.  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  Clinical Impression  Prior to hospital admission, pt was independent; lives with his wife in 1 level home with 3 STE (no railing).  Per discussion with pt's nurse, titrated pt from 3 L to 2 L HFNC.  Currently pt is SBA with transfers and CGA ambulating 40 feet and then 120 feet (no AD) on 2 L HFNC.  O2 sats 92% or greater during sessions activities.  Pt left on 2 L HFNC end of session with O2 sats 93% at rest (nurse notified).  Pt would benefit from skilled PT to address noted impairments and functional limitations (see below for any additional details).  Upon hospital discharge, anticipate no further PT needs.    Follow Up Recommendations No PT follow up    Equipment Recommendations  None recommended by PT    Recommendations for Other Services       Precautions / Restrictions Precautions Precautions: Fall Restrictions Weight Bearing Restrictions: No      Mobility  Bed Mobility Overal bed mobility: Modified Independent             General bed mobility comments: Deferred (pt up in recliner beginning/end of session)    Transfers Overall transfer level: Needs assistance Equipment used: None Transfers: Sit to/from Stand Sit to Stand: Supervision         General transfer comment: fairly strong stand from recliner x2 trials  Ambulation/Gait Ambulation/Gait assistance: Min guard Gait Distance (Feet):  (40 feet; 120 feet in room) Assistive device: None (pt occasionally touching furniture in room (pt reports h/o doing this))   Gait  velocity: mildly decreased   General Gait Details: L knee occasionally "giving" but NOT buckling (pt reports h/o this but never buckles)  Stairs            Wheelchair Mobility    Modified Rankin (Stroke Patients Only)       Balance Overall balance assessment: Needs assistance Sitting-balance support: No upper extremity supported;Feet supported Sitting balance-Leahy Scale: Normal Sitting balance - Comments: steady sitting reaching outside BOS   Standing balance support: No upper extremity supported;During functional activity Standing balance-Leahy Scale: Good Standing balance comment: no loss of balance with ambulation                             Pertinent Vitals/Pain Pain Assessment: No/denies pain  HR 80 bpm at rest; HR briefly increased to 120 bpm post ambulation (recovered to 80 bpm within 30 seconds)    Home Living Family/patient expects to be discharged to:: Private residence Living Arrangements: Spouse/significant other Available Help at Discharge: Family;Available 24 hours/day Type of Home: House Home Access: Stairs to enter   [employer] of Steps: 3 STE (no rails but planning to put up B rails) Home Layout: One level Home Equipment: None      Prior Function Level of Independence: Independent         Comments: (+) driving; no falls in past 6 months     Hand Dominance   Dominant Hand: Right    Extremity/Trunk Assessment   Upper  Extremity Assessment Upper Extremity Assessment: Generalized weakness    Lower Extremity Assessment Lower Extremity Assessment: Generalized weakness    Cervical / Trunk Assessment Cervical / Trunk Assessment: Normal  Communication   Communication: No difficulties  Cognition Arousal/Alertness: Awake/alert Behavior During Therapy: WFL for tasks assessed/performed Overall Cognitive Status: Within Functional Limits for tasks assessed                                         General Comments Nursing cleared pt for participation in physical therapy and pt's nurse reports pt does not need a chair alarm.  Pt agreeable to PT session.    Exercises    Assessment/Plan    PT Assessment Patient needs continued PT services  PT Problem List Decreased strength;Decreased activity tolerance;Decreased balance;Decreased mobility;Decreased knowledge of precautions       PT Treatment Interventions DME instruction;Gait training;Stair training;Functional mobility training;Therapeutic activities;Therapeutic exercise;Balance training;Patient/family education    PT Goals (Current goals can be found in the Care Plan section)  Acute Rehab PT Goals Patient Stated Goal: go home PT Goal Formulation: With patient Time For Goal Achievement: 03/01/20 Potential to Achieve Goals: Good    Frequency Min 2X/week   Barriers to discharge        Co-evaluation               AM-PAC PT "6 Clicks" Mobility  Outcome Measure Help needed turning from your back to your side while in a flat bed without using bedrails?: None Help needed moving from lying on your back to sitting on the side of a flat bed without using bedrails?: None Help needed moving to and from a bed to a chair (including a wheelchair)?: A Little Help needed standing up from a chair using your arms (e.g., wheelchair or bedside chair)?: A Little Help needed to walk in hospital room?: A Little Help needed climbing 3-5 steps with a railing? : A Little 6 Click Score: 20    End of Session Equipment Utilized During Treatment: Gait belt;Oxygen (2 L O2 via HFNC) Activity Tolerance: Patient tolerated treatment well Patient left: in chair;with call bell/phone within reach Nurse Communication: Mobility status;Precautions;Other (comment) (pt's O2 status and pt left on 2 L O2 HFNC) PT Visit Diagnosis: Other abnormalities of gait and mobility (R26.89);Muscle weakness (generalized) (M62.81)    Time: 1410-1435 PT Time  Calculation (min) (ACUTE ONLY): 25 min   Charges:   PT Evaluation $PT Eval Low Complexity: 1 Low PT Treatments $Therapeutic Exercise: 8-22 mins       Leitha Bleak, PT 02/16/20, 2:58 PM

## 2020-02-17 DIAGNOSIS — D75839 Thrombocytosis, unspecified: Secondary | ICD-10-CM | POA: Diagnosis not present

## 2020-02-17 DIAGNOSIS — U071 COVID-19: Secondary | ICD-10-CM | POA: Diagnosis not present

## 2020-02-17 DIAGNOSIS — N179 Acute kidney failure, unspecified: Secondary | ICD-10-CM | POA: Diagnosis not present

## 2020-02-17 LAB — FERRITIN: Ferritin: 640 ng/mL — ABNORMAL HIGH (ref 24–336)

## 2020-02-17 LAB — CBC WITH DIFFERENTIAL/PLATELET
Abs Immature Granulocytes: 0.85 10*3/uL — ABNORMAL HIGH (ref 0.00–0.07)
Basophils Absolute: 0.1 10*3/uL (ref 0.0–0.1)
Basophils Relative: 0 %
Eosinophils Absolute: 0.2 10*3/uL (ref 0.0–0.5)
Eosinophils Relative: 1 %
HCT: 41.8 % (ref 39.0–52.0)
Hemoglobin: 14 g/dL (ref 13.0–17.0)
Immature Granulocytes: 3 %
Lymphocytes Relative: 9 %
Lymphs Abs: 2.8 10*3/uL (ref 0.7–4.0)
MCH: 29.9 pg (ref 26.0–34.0)
MCHC: 33.5 g/dL (ref 30.0–36.0)
MCV: 89.3 fL (ref 80.0–100.0)
Monocytes Absolute: 3.3 10*3/uL — ABNORMAL HIGH (ref 0.1–1.0)
Monocytes Relative: 11 %
Neutro Abs: 23.3 10*3/uL — ABNORMAL HIGH (ref 1.7–7.7)
Neutrophils Relative %: 76 %
Platelets: 1045 10*3/uL (ref 150–400)
RBC: 4.68 MIL/uL (ref 4.22–5.81)
RDW: 12.5 % (ref 11.5–15.5)
Smear Review: NORMAL
WBC: 30.5 10*3/uL — ABNORMAL HIGH (ref 4.0–10.5)
nRBC: 0 % (ref 0.0–0.2)

## 2020-02-17 LAB — COMPREHENSIVE METABOLIC PANEL
ALT: 24 U/L (ref 0–44)
AST: 17 U/L (ref 15–41)
Albumin: 2.7 g/dL — ABNORMAL LOW (ref 3.5–5.0)
Alkaline Phosphatase: 51 U/L (ref 38–126)
Anion gap: 11 (ref 5–15)
BUN: 72 mg/dL — ABNORMAL HIGH (ref 8–23)
CO2: 21 mmol/L — ABNORMAL LOW (ref 22–32)
Calcium: 9.1 mg/dL (ref 8.9–10.3)
Chloride: 105 mmol/L (ref 98–111)
Creatinine, Ser: 1.64 mg/dL — ABNORMAL HIGH (ref 0.61–1.24)
GFR, Estimated: 44 mL/min — ABNORMAL LOW (ref >60)
Glucose, Bld: 105 mg/dL — ABNORMAL HIGH (ref 70–99)
Potassium: 4.1 mmol/L (ref 3.5–5.1)
Sodium: 137 mmol/L (ref 135–145)
Total Bilirubin: 0.8 mg/dL (ref 0.3–1.2)
Total Protein: 6.8 g/dL (ref 6.5–8.1)

## 2020-02-17 LAB — CULTURE, BLOOD (ROUTINE X 2)
Culture: NO GROWTH
Culture: NO GROWTH
Special Requests: ADEQUATE
Special Requests: ADEQUATE

## 2020-02-17 LAB — C-REACTIVE PROTEIN: CRP: 1.1 mg/dL — ABNORMAL HIGH (ref <1.0)

## 2020-02-17 LAB — PHOSPHORUS: Phosphorus: 4.5 mg/dL (ref 2.5–4.6)

## 2020-02-17 LAB — GLUCOSE, CAPILLARY
Glucose-Capillary: 92 mg/dL (ref 70–99)
Glucose-Capillary: 123 mg/dL — ABNORMAL HIGH (ref 70–99)
Glucose-Capillary: 230 mg/dL — ABNORMAL HIGH (ref 70–99)
Glucose-Capillary: 406 mg/dL — ABNORMAL HIGH (ref 70–99)
Glucose-Capillary: 346 mg/dL — ABNORMAL HIGH (ref 70–99)

## 2020-02-17 LAB — FIBRIN DERIVATIVES D-DIMER (ARMC ONLY): Fibrin derivatives D-dimer (ARMC): 1034.21 ng/mL (FEU) — ABNORMAL HIGH (ref 0.00–499.00)

## 2020-02-17 LAB — MAGNESIUM: Magnesium: 2.5 mg/dL — ABNORMAL HIGH (ref 1.7–2.4)

## 2020-02-17 NOTE — Progress Notes (Signed)
PROGRESS NOTE    Brent [name].  EPP:[id number] DOB: Feb 10, 1945 DOA: 02/12/2020 PCP: [name], MD  Assessment & Plan:   Active Problems:   COVID-19 virus infection   COVID-19  Acute hypoxic respiratory failure: improving slowly.  Secondary to COVID19 pneumonia. Continue on IV steroids & bronchodilators. Completed remdesivir course. Encourage incentive spirometry. Continue on supplemental oxygen and wean as tolerated   DM2: uncontrolled w/ HbA1c of 8.1. Continue on lantus, novolog w/ meals & SSI w/ accuchecks.   HTN: continue on home dose of lisinopril, HCTZ  Hyperkalemia: resolved   AKI: Cr is trending up again today. Unknown baseline Cr/GFR. 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 If platelet level continues to rise, will consult heme  Morbid obesity: BMI 36.0. Complicates overall care and prognosis    DVT prophylaxis: lovenox  Code Status: full Family Communication:  Disposition Plan: d/c back home  Status is: Inpatient  Remains inpatient appropriate because:Unsafe d/c plan, IV treatments appropriate due to intensity of illness or inability to take PO and Inpatient level of care appropriate due to severity of illness   Dispo: The patient is from: Home              Anticipated d/c is to: home              Anticipated d/c date is: 1 or 2 days              Patient currently is not medically stable to d/c.         Consultants:      Procedures:    Antimicrobials:    Subjective: Pt c/o fatigue.   Objective: Vitals:   02/16/20 1737 02/16/20 1939 02/16/20 2344 02/17/20 0432  BP:  111/81 113/73 109/65  Pulse:  75 68 62  Resp:  17 20 14   Temp:  97.8 F (36.6 C) 98.2 F (36.8 C) 97.9 F (36.6 C)  TempSrc:   Oral   SpO2: 96% 94% 95% 91%  Weight:      Height:        Intake/Output Summary (Last 24 hours) at 02/17/2020 0728 Last data filed at 02/16/2020 1600 Gross per  24 hour  Intake 9.27 ml  Output 250 ml  Net -240.73 ml   Filed Weights   02/12/20 1502  Weight: 107.5 kg    Examination:  General exam: Appears calm & comfortable   Respiratory system: decreased breath sounds b/l.  Cardiovascular system: S1 & S2+. No rubs or gallops  Gastrointestinal system:  Abd is soft, NT, ND & hypoactive bowel sounds  Central nervous system: Alert and oriented. Moves all 4 extremities  Psychiatry: Judgement and insight appear normal. Flat mood and affect    Data Reviewed: I have personally reviewed following labs and imaging studies  CBC: Recent Labs  Lab 02/13/20 0619 02/14/20 0704 02/15/20 0409 02/16/20 0507 02/17/20 0502  WBC 20.6* 32.0* 30.9* 26.1* 30.5*  NEUTROABS 18.1* 27.2* 27.7* 23.1* 23.3*  HGB 13.9 14.0 13.8 14.5 14.0  HCT 40.8 41.2 40.0 43.8 41.8  MCV 88.7 88.6 87.3 89.0 89.3  PLT 935* 1,070* 1,036* 1,104* 6,063*   Basic Metabolic Panel: Recent Labs  Lab 02/13/20 0619 02/14/20 0704 02/15/20 0409 02/15/20 2002 02/16/20 0507 02/17/20 0502  NA 134* 134* 133* 134* 135 137  K 5.0 4.7 5.5* 4.9 5.2* 4.1  CL 100 101 103 100 101 105  CO2 23 22 19* 22 23 21*  GLUCOSE 434* 271* 285* 291* 226* 105*  BUN 52* 60* 56* 53* 51* 72*  CREATININE 1.23 1.40* 1.12 1.25* 1.26* 1.64*  CALCIUM 9.5 9.8 9.4 9.4 9.6 9.1  MG 2.5* 2.5* 2.2  --  2.3 2.5*  PHOS 4.5 4.1 3.8  --  4.4 4.5   GFR: Estimated Creatinine Clearance: 48.4 mL/min (A) (by C-G formula based on SCr of 1.64 mg/dL (H)). Liver Function Tests: Recent Labs  Lab 02/13/20 0619 02/14/20 0704 02/15/20 0409 02/16/20 0507 02/17/20 0502  AST 22 26 27 21 17   ALT 25 32 34 30 24  ALKPHOS 54 53 49 52 51  BILITOT 0.9 0.8 1.1 1.2 0.8  PROT 8.2* 7.9 7.4 7.6 6.8  ALBUMIN 2.9* 2.8* 2.7* 2.8* 2.7*   No results for input(s): LIPASE, AMYLASE in the last 168 hours. No results for input(s): AMMONIA in the last 168 hours. Coagulation Profile: No results for input(s): INR, PROTIME in the last 168  hours. Cardiac Enzymes: No results for input(s): CKTOTAL, CKMB, CKMBINDEX, TROPONINI in the last 168 hours. BNP (last 3 results) No results for input(s): PROBNP in the last 8760 hours. HbA1C: No results for input(s): HGBA1C in the last 72 hours. CBG: Recent Labs  Lab 02/16/20 1224 02/16/20 1613 02/16/20 1953 02/16/20 2342 02/17/20 0429  GLUCAP 316* 312* 350* 126* 92   Lipid Profile: No results for input(s): CHOL, HDL, LDLCALC, TRIG, CHOLHDL, LDLDIRECT in the last 72 hours. Thyroid Function Tests: No results for input(s): TSH, T4TOTAL, FREET4, T3FREE, THYROIDAB in the last 72 hours. Anemia Panel: Recent Labs    02/16/20 0507 02/17/20 0502  FERRITIN 520* 640*   Sepsis Labs: Recent Labs  Lab 02/12/20 1507 02/14/20 0704 02/15/20 0409  PROCALCITON 0.14 <0.10 <0.10    Recent Results (from the past 240 hour(s))  Blood culture (routine x 2)     Status: None   Collection Time: 02/12/20  7:42 PM   Specimen: BLOOD  Result Value Ref Range Status   Specimen Description BLOOD RIGHT ANTECUBITAL  Final   Special Requests   Final    BOTTLES DRAWN AEROBIC AND ANAEROBIC Blood Culture adequate volume   Culture   Final    NO GROWTH 5 DAYS Performed at [facility], [address], [place], [place] [address]    Report Status 02/17/2020 FINAL  Final  Blood culture (routine x 2)     Status: None   Collection Time: 02/12/20  7:42 PM   Specimen: BLOOD  Result Value Ref Range Status   Specimen Description BLOOD LEFT ANTECUBITAL  Final   Special Requests   Final    BOTTLES DRAWN AEROBIC AND ANAEROBIC Blood Culture adequate volume   Culture   Final    NO GROWTH 5 DAYS Performed at [facility], [address] [place], [place] [address]    Report Status 02/17/2020 FINAL  Final  Resp Panel by RT-PCR (Flu A&B, Covid) Nasopharyngeal Swab     Status: Abnormal   Collection Time: 02/15/20  5:52 PM   Specimen: Nasopharyngeal Swab; Nasopharyngeal(NP) swabs in vial  transport medium  Result Value Ref Range Status   SARS Coronavirus 2 by RT PCR POSITIVE (A) NEGATIVE Final    Comment: RESULT CALLED TO, READ BACK BY AND VERIFIED WITH: JACKIE RN 02/15/20 AT 1901 BY ACR (NOTE) SARS-CoV-2 target nucleic acids are DETECTED.  The SARS-CoV-2 RNA is generally detectable in upper respiratory specimens during the acute phase of infection. Positive results are indicative of the presence of the identified  virus, but do not rule out bacterial infection or co-infection with other pathogens not detected by the test. Clinical correlation with patient history and other diagnostic information is necessary to determine patient infection status. The expected result is Negative.  Fact Sheet for Patients: EntrepreneurPulse.com.au  Fact Sheet for Healthcare Providers: IncredibleEmployment.be  This test is not yet approved or cleared by the [place] FDA and  has been authorized for detection and/or diagnosis of SARS-CoV-2 by FDA under an Emergency Use Authorization (EUA).  This EUA will remain in effect (meaning this test can be  used) for the duration of  the COVID-19 declaration under Section 564(b)(1) of the Act, 21 U.S.C. section 360bbb-3(b)(1), unless the authorization is terminated or revoked sooner.     Influenza A by PCR NEGATIVE NEGATIVE Final   Influenza B by PCR NEGATIVE NEGATIVE Final    Comment: (NOTE) The Xpert Xpress SARS-CoV-2/FLU/RSV plus assay is intended as an aid in the diagnosis of influenza from Nasopharyngeal swab specimens and should not be used as a sole basis for treatment. Nasal washings and aspirates are unacceptable for Xpert Xpress SARS-CoV-2/FLU/RSV testing.  Fact Sheet for Patients: EntrepreneurPulse.com.au  Fact Sheet for Healthcare Providers: IncredibleEmployment.be  This test is not yet approved or cleared by the [place] FDA and has been  authorized for detection and/or diagnosis of SARS-CoV-2 by FDA under an Emergency Use Authorization (EUA). This EUA will remain in effect (meaning this test can be used) for the duration of the COVID-19 declaration under Section 564(b)(1) of the Act, 21 U.S.C. section 360bbb-3(b)(1), unless the authorization is terminated or revoked.  Performed at [facility], [address], [place], [place] [address]          Radiology Studies: No results found.      Scheduled Meds: . aspirin EC  81 mg Oral Daily  . atorvastatin  40 mg Oral QHS  . enoxaparin (LOVENOX) injection  0.5 mg/kg Subcutaneous Q24H  . hydrochlorothiazide  25 mg Oral Daily  . insulin aspart  0-20 Units Subcutaneous Q4H  . insulin aspart  10 Units Subcutaneous TID WC  . insulin glargine  12 Units Subcutaneous BID  . Ipratropium-Albuterol  1 puff Inhalation Q6H  . lisinopril  40 mg Oral BH-q7a  . melatonin  9 mg Oral QHS  . omega-3 acid ethyl esters  1 g Oral Daily  . patiromer  8.4 g Oral Daily  . predniSONE  50 mg Oral Daily   Continuous Infusions: . sodium chloride 250 mL (02/16/20 0817)     LOS: 5 days    Time spent: 31 mins     Wyvonnia Dusky, MD [facility] Pager [phone]  If 7PM-7AM, please contact night-coverage 02/17/2020, 7:28 AM

## 2020-02-18 DIAGNOSIS — D75839 Thrombocytosis, unspecified: Secondary | ICD-10-CM | POA: Diagnosis not present

## 2020-02-18 DIAGNOSIS — E1169 Type 2 diabetes mellitus with other specified complication: Secondary | ICD-10-CM

## 2020-02-18 DIAGNOSIS — U071 COVID-19: Secondary | ICD-10-CM | POA: Diagnosis not present

## 2020-02-18 DIAGNOSIS — E669 Obesity, unspecified: Secondary | ICD-10-CM

## 2020-02-18 DIAGNOSIS — E1159 Type 2 diabetes mellitus with other circulatory complications: Secondary | ICD-10-CM

## 2020-02-18 DIAGNOSIS — I152 Hypertension secondary to endocrine disorders: Secondary | ICD-10-CM

## 2020-02-18 LAB — GLUCOSE, CAPILLARY
Glucose-Capillary: 231 mg/dL — ABNORMAL HIGH (ref 70–99)
Glucose-Capillary: 242 mg/dL — ABNORMAL HIGH (ref 70–99)
Glucose-Capillary: 85 mg/dL (ref 70–99)
Glucose-Capillary: 97 mg/dL (ref 70–99)

## 2020-02-18 LAB — CBC
HCT: 42.2 % (ref 39.0–52.0)
Hemoglobin: 14.7 g/dL (ref 13.0–17.0)
MCH: 30.2 pg (ref 26.0–34.0)
MCHC: 34.8 g/dL (ref 30.0–36.0)
MCV: 86.7 fL (ref 80.0–100.0)
Platelets: 1001 10*3/uL (ref 150–400)
RBC: 4.87 MIL/uL (ref 4.22–5.81)
RDW: 12.5 % (ref 11.5–15.5)
WBC: 27.9 10*3/uL — ABNORMAL HIGH (ref 4.0–10.5)
nRBC: 0 % (ref 0.0–0.2)

## 2020-02-18 LAB — BASIC METABOLIC PANEL
Anion gap: 8 (ref 5–15)
BUN: 71 mg/dL — ABNORMAL HIGH (ref 8–23)
CO2: 24 mmol/L (ref 22–32)
Calcium: 9.5 mg/dL (ref 8.9–10.3)
Chloride: 104 mmol/L (ref 98–111)
Creatinine, Ser: 1.61 mg/dL — ABNORMAL HIGH (ref 0.61–1.24)
GFR, Estimated: 45 mL/min — ABNORMAL LOW (ref >60)
Glucose, Bld: 64 mg/dL — ABNORMAL LOW (ref 70–99)
Potassium: 4.9 mmol/L (ref 3.5–5.1)
Sodium: 136 mmol/L (ref 135–145)

## 2020-02-18 LAB — FERRITIN: Ferritin: 865 ng/mL — ABNORMAL HIGH (ref 24–336)

## 2020-02-18 LAB — C-REACTIVE PROTEIN: CRP: 0.8 mg/dL (ref <1.0)

## 2020-02-18 NOTE — Progress Notes (Signed)
Received Md order to discharge patient to home, reviewed homes meds, discharge instructions, and follow up appointments with patient and wife and they both verbalized understanding.

## 2020-02-18 NOTE — Progress Notes (Signed)
Inpatient Diabetes Program Recommendations  AACE/ADA: New Consensus Statement on Inpatient Glycemic Contro Target Ranges:  Prepandial:   less than 140 mg/dL      Peak postprandial:   less than 180 mg/dL (1-2 hours)      Critically ill patients:  140 - 180 mg/dL   Results for JOH, RAO (MRN [id number]) as of 02/18/2020 10:15  Ref. Range 02/17/2020 08:04 02/17/2020 11:51 02/17/2020 20:02 02/17/2020 22:17 02/18/2020 00:40 02/18/2020 04:55 02/18/2020 08:12  Glucose-Capillary Latest Ref Range: 70 - 99 mg/dL 123 (H) 230 (H) 406 (H) 346 (H) 242 (H) 85 97   Review of Glycemic Control  Current orders for Inpatient glycemic control: Lantus 12 units BID, Novolog 10 units TID with meals, Novolog 0-20 units Q4H; Prednisone 50 mg daily  Inpatient Diabetes Program Recommendations:    Insulin: If steroids are continued, please consider increasing meal coverage to Novolog 15 units TID with meals if patient eats at least 50% of meals.  Thanks, Barnie Alderman, RN, MSN, CDE Diabetes Coordinator Inpatient Diabetes Program [phone] (Team Pager from 8am to 5pm)

## 2020-02-18 NOTE — Progress Notes (Signed)
SATURATION QUALIFICATIONS: (This note is used to comply with regulatory documentation for home oxygen)  Patient Saturations on Room Air at Rest = 93%  Patient Saturations on Room Air while Ambulating = 90%  Patient Saturations on 2Liters of oxygen while Ambulating = 93%  Please briefly explain why patient needs home oxygen:

## 2020-02-18 NOTE — Care Management Important Message (Signed)
Important Message  Patient Details  Name: Brent [name]. MRN: [id number] Date of Birth: 1945-02-17   Medicare Important Message Given:  Yes     Shelbie Hutching, RN 02/18/2020, 12:55 PM

## 2020-02-18 NOTE — TOC Transition Note (Signed)
Transition of Care [facility]) - CM/SW Discharge Note   Patient Details  Name: Brent [name]. MRN: [id number] Date of Birth: 02/02/1946  Transition of Care [facility]) CM/SW Contact:  Shelbie Hutching, RN Phone Number: 02/18/2020, 1:00 PM   Clinical Narrative:    Patient Admitted to the hospital with COVID requiring acute oxygen.  Patient has been medically cleared for discharge and has been weaned down to room air.  Patient is from home with his wife.  Wife will be picking the patient up this afternoon.  No TOC needs identified.    Final next level of care: Home/Self Care Barriers to Discharge: No Barriers Identified   Patient Goals and CMS Choice Patient states their goals for this hospitalization and ongoing recovery are:: Ready to go home      Discharge Placement                       Discharge Plan and Services                DME Arranged: N/A DME Agency: NA       HH Arranged: NA          Social Determinants of Health (SDOH) Interventions     Readmission Risk Interventions No flowsheet data found.

## 2020-02-18 NOTE — Discharge Summary (Addendum)
Physician Discharge Summary  Brent [name]. [id number] DOB: July 08, 1945 DOA: 02/12/2020  PCP: [name], MD  Admit date: 02/12/2020 Discharge date: 02/18/2020  Admitted From: home Disposition: home  Recommendations for Outpatient Follow-up:  1. Follow up with PCP in 2 weeks  Home Health: no Equipment/Devices:  Discharge Condition: stable  CODE STATUS: full  Diet recommendation: Heart Healthy / Carb Modified    Brief/Interim Summary: HPI was taken from Dr. Roosevelt Locks: Brent [name]. is a 75 y.o. 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  Patient started to have dry cough about 2 weeks ago, denied any loss of taste, no muscle aching no diarrhea.  He went to tested positive forOVID-19 on 01/29/2020.  He did not receive the monoclonal antibody infusion or p.o. treatment for the Covid infection.  His symptoms became worse 3 days ago, since then he developed increasing shortness of breath with minimal activity, no fever chills, no chest pain or palpitation, cough remained to be dry.  He went to see PCP today and was found pulse ox level was low and sent to the ED.  He received Moderna vaccine x2 last year.  ED Course: Patient was found to have hypoxia, stabilized with 3 L and 91%.  Chest x-ray showed diffuse bilateral multifocal pneumonia.  Blood work glucose 440, WBC 21.  Hospital Course from Dr. Jimmye Norman 1/11-1/13/22: Pt was found to have COVID19 pneumonia and was treated w/ remdesivir, steroids, bronchodilators & incentive spirometry. Pt initially required supplemental oxygen but was able to be weaned off prior to d/c. PT/OT evaluated the pt but the pt did not need any further PT/OT as pt was ambulating and transferring independently. For more information, please see previous progress notes.   Discharge  Diagnoses:  Active Problems:   COVID-19 virus infection   COVID-19  Acute hypoxic respiratory failure:   secondary to COVID19 pneumonia. Continue on  steroids & bronchodilators. Completed remdesivir course. Encourage incentive spirometry. Weaned off of supplemental oxygen   DM2: uncontrolled w/ HbA1c of 8.1. Continue on lantus, novolog w/ meals & SSI w/ accuchecks.   HTN: continue on home dose of lisinopril, HCTZ  Hyperkalemia: resolved   AKI: Cr is trending down today slightly. Unknown baseline Cr/GFR. 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 If platelet level continues to rise, will consult heme  Morbid obesity: BMI 36.0. Complicates overall care and prognosis     Discharge Instructions  Discharge Instructions    Diet - low sodium heart healthy   Complete by: As directed    Diet Carb Modified   Complete by: As directed    Discharge instructions   Complete by: As directed    F/u w/ PCP in 2 weeks.   Increase activity slowly   Complete by: As directed      Allergies as of 02/18/2020   No Known Allergies     Medication List    TAKE these medications   acetaminophen 325 MG tablet Commonly known as: TYLENOL Take 325 mg by mouth every 6 (six) hours as needed for mild pain.   aspirin EC 81 MG tablet Take 81 mg by mouth daily.   atorvastatin 40 MG tablet Commonly known as: LIPITOR Take 40 mg by mouth at bedtime.   azithromycin 250 MG tablet Commonly known as: ZITHROMAX Take by mouth.  Fish Oil 1000 MG Caps Take 1,000 mg by mouth in the morning and at bedtime.   hydrochlorothiazide 25 MG tablet Commonly known as: HYDRODIURIL Take 25 mg by mouth daily.   lisinopril 40 MG tablet Commonly known as: ZESTRIL Take 40 mg by mouth every morning.   Melatonin 10 MG Tabs Take 10 mg by mouth at bedtime.   metFORMIN 500 MG tablet Commonly known as: GLUCOPHAGE Take 500-1,000 mg by mouth See admin  instructions. Take 1000 mg by mouth in the morning and 500 mg at night   naproxen sodium 220 MG tablet Commonly known as: ALEVE Take 220-440 mg by mouth daily as needed (pain).   predniSONE 20 MG tablet Commonly known as: DELTASONE Take 40 mg by mouth daily.   sildenafil 100 MG tablet Commonly known as: VIAGRA Take 100 mg by mouth at bedtime as needed for erectile dysfunction.       No Known Allergies  Consultations:     Procedures/Studies: DG Chest Portable 1 View  Result Date: 02/12/2020 CLINICAL DATA:  Shortness of breath. EXAM: PORTABLE CHEST 1 VIEW COMPARISON:  05/16/2018 FINDINGS: There are diffuse bilateral hazy airspace opacities. No pneumothorax or large pleural effusion. The heart size is stable. Aortic calcifications are noted. There is no acute osseous abnormality. IMPRESSION: Diffuse bilateral hazy airspace opacities consistent with the patient's history of viral pneumonia. Electronically Signed   By: Constance Holster M.D.   On: 02/12/2020 18:27     Subjective: Pt c/o fatigue    Discharge Exam: Vitals:   02/18/20 0744 02/18/20 0955  BP: 108/65 (!) 111/51  Pulse: (!) 56 68  Resp: 16   Temp: 97.8 F (36.6 C)   SpO2: 94%    Vitals:   02/18/20 0043 02/18/20 0458 02/18/20 0744 02/18/20 0955  BP: 118/71 110/69 108/65 (!) 111/51  Pulse: 64 (!) 59 (!) 56 68  Resp: 18 20 16    Temp: (!) 97.3 F (36.3 C) 97.9 F (36.6 C) 97.8 F (36.6 C)   TempSrc:  Oral    SpO2: 93% 95% 94%   Weight:      Height:        General: Pt is alert, awake, not in acute distress Cardiovascular: S1/S2 +, no rubs, no gallops Respiratory: decreased breath sounds b/l otherwise clear Abdominal: Soft, NT, ND, bowel sounds + Extremities: no edema, no cyanosis    The results of significant diagnostics from this hospitalization (including imaging, microbiology, ancillary and laboratory) are listed below for reference.     Microbiology: Recent Results (from the past 240  hour(s))  Blood culture (routine x 2)     Status: None   Collection Time: 02/12/20  7:42 PM   Specimen: BLOOD  Result Value Ref Range Status   Specimen Description BLOOD RIGHT ANTECUBITAL  Final   Special Requests   Final    BOTTLES DRAWN AEROBIC AND ANAEROBIC Blood Culture adequate volume   Culture   Final    NO GROWTH 5 DAYS Performed at [facility], [address] [place], [place] [address]    Report Status 02/17/2020 FINAL  Final  Blood culture (routine x 2)     Status: None   Collection Time: 02/12/20  7:42 PM   Specimen: BLOOD  Result Value Ref Range Status   Specimen Description BLOOD LEFT ANTECUBITAL  Final   Special Requests   Final    BOTTLES DRAWN AEROBIC AND ANAEROBIC Blood Culture adequate volume   Culture   Final    NO  GROWTH 5 DAYS Performed at [facility], [address] [place], [place] [address]    Report Status 02/17/2020 FINAL  Final  Resp Panel by RT-PCR (Flu A&B, Covid) Nasopharyngeal Swab     Status: Abnormal   Collection Time: 02/15/20  5:52 PM   Specimen: Nasopharyngeal Swab; Nasopharyngeal(NP) swabs in vial transport medium  Result Value Ref Range Status   SARS Coronavirus 2 by RT PCR POSITIVE (A) NEGATIVE Final    Comment: RESULT CALLED TO, READ BACK BY AND VERIFIED WITH: JACKIE RN 02/15/20 AT 1901 BY ACR (NOTE) SARS-CoV-2 target nucleic acids are DETECTED.  The SARS-CoV-2 RNA is generally detectable in upper respiratory specimens during the acute phase of infection. Positive results are indicative of the presence of the identified virus, but do not rule out bacterial infection or co-infection with other pathogens not detected by the test. Clinical correlation with patient history and other diagnostic information is necessary to determine patient infection status. The expected result is Negative.  Fact Sheet for Patients: EntrepreneurPulse.com.au  Fact Sheet for Healthcare  Providers: IncredibleEmployment.be  This test is not yet approved or cleared by the [place] FDA and  has been authorized for detection and/or diagnosis of SARS-CoV-2 by FDA under an Emergency Use Authorization (EUA).  This EUA will remain in effect (meaning this test can be  used) for the duration of  the COVID-19 declaration under Section 564(b)(1) of the Act, 21 U.S.C. section 360bbb-3(b)(1), unless the authorization is terminated or revoked sooner.     Influenza A by PCR NEGATIVE NEGATIVE Final   Influenza B by PCR NEGATIVE NEGATIVE Final    Comment: (NOTE) The Xpert Xpress SARS-CoV-2/FLU/RSV plus assay is intended as an aid in the diagnosis of influenza from Nasopharyngeal swab specimens and should not be used as a sole basis for treatment. Nasal washings and aspirates are unacceptable for Xpert Xpress SARS-CoV-2/FLU/RSV testing.  Fact Sheet for Patients: EntrepreneurPulse.com.au  Fact Sheet for Healthcare Providers: IncredibleEmployment.be  This test is not yet approved or cleared by the [place] FDA and has been authorized for detection and/or diagnosis of SARS-CoV-2 by FDA under an Emergency Use Authorization (EUA). This EUA will remain in effect (meaning this test can be used) for the duration of the COVID-19 declaration under Section 564(b)(1) of the Act, 21 U.S.C. section 360bbb-3(b)(1), unless the authorization is terminated or revoked.  Performed at [facility], [address] [place], [place] [address]      Labs: BNP (last 3 results) Recent Labs    02/12/20 2013  BNP 60.7   Basic Metabolic Panel: Recent Labs  Lab 02/13/20 0619 02/14/20 0704 02/15/20 0409 02/15/20 2002 02/16/20 0507 02/17/20 0502 02/18/20 0512  NA 134* 134* 133* 134* 135 137 136  K 5.0 4.7 5.5* 4.9 5.2* 4.1 4.9  CL 100 101 103 100 101 105 104  CO2 23 22 19* 22 23 21* 24  GLUCOSE 434* 271* 285* 291* 226*  105* 64*  BUN 52* 60* 56* 53* 51* 72* 71*  CREATININE 1.23 1.40* 1.12 1.25* 1.26* 1.64* 1.61*  CALCIUM 9.5 9.8 9.4 9.4 9.6 9.1 9.5  MG 2.5* 2.5* 2.2  --  2.3 2.5*  --   PHOS 4.5 4.1 3.8  --  4.4 4.5  --    Liver Function Tests: Recent Labs  Lab 02/13/20 0619 02/14/20 0704 02/15/20 0409 02/16/20 0507 02/17/20 0502  AST 22 26 27 21 17   ALT 25 32 34 30 24  ALKPHOS 54 53 49 52 51  BILITOT 0.9 0.8 1.1 1.2 0.8  PROT 8.2* 7.9 7.4 7.6 6.8  ALBUMIN 2.9* 2.8* 2.7* 2.8* 2.7*   No results for input(s): LIPASE, AMYLASE in the last 168 hours. No results for input(s): AMMONIA in the last 168 hours. CBC: Recent Labs  Lab 02/13/20 0619 02/14/20 0704 02/15/20 0409 02/16/20 0507 02/17/20 0502 02/18/20 0512  WBC 20.6* 32.0* 30.9* 26.1* 30.5* 27.9*  NEUTROABS 18.1* 27.2* 27.7* 23.1* 23.3*  --   HGB 13.9 14.0 13.8 14.5 14.0 14.7  HCT 40.8 41.2 40.0 43.8 41.8 42.2  MCV 88.7 88.6 87.3 89.0 89.3 86.7  PLT 935* 1,070* 1,036* 1,104* 1,045* 1,001*   Cardiac Enzymes: No results for input(s): CKTOTAL, CKMB, CKMBINDEX, TROPONINI in the last 168 hours. BNP: Invalid input(s): POCBNP CBG: Recent Labs  Lab 02/17/20 2217 02/18/20 0040 02/18/20 0455 02/18/20 0812 02/18/20 1140  GLUCAP 346* 242* 85 97 231*   D-Dimer Recent Labs    02/16/20 0507  DDIMER 1.39*   Hgb A1c No results for input(s): HGBA1C in the last 72 hours. Lipid Profile No results for input(s): CHOL, HDL, LDLCALC, TRIG, CHOLHDL, LDLDIRECT in the last 72 hours. Thyroid function studies No results for input(s): TSH, T4TOTAL, T3FREE, THYROIDAB in the last 72 hours.  Invalid input(s): FREET3 Anemia work up Recent Labs    02/17/20 0502 02/18/20 0512  FERRITIN 640* 865*   Urinalysis    Component Value Date/Time   COLORURINE YELLOW (A) 05/15/2018 0819   APPEARANCEUR HAZY (A) 05/15/2018 0819   LABSPEC >1.046 (H) 05/15/2018 0819   PHURINE 5.0 05/15/2018 0819   GLUCOSEU >=500 (A) 05/15/2018 0819   HGBUR NEGATIVE  05/15/2018 [place] NEGATIVE 05/15/2018 [place] NEGATIVE 05/15/2018 0819   PROTEINUR 30 (A) 05/15/2018 0819   NITRITE NEGATIVE 05/15/2018 0819   LEUKOCYTESUR NEGATIVE 05/15/2018 0819   Sepsis Labs Invalid input(s): PROCALCITONIN,  WBC,  LACTICIDVEN Microbiology Recent Results (from the past 240 hour(s))  Blood culture (routine x 2)     Status: None   Collection Time: 02/12/20  7:42 PM   Specimen: BLOOD  Result Value Ref Range Status   Specimen Description BLOOD RIGHT ANTECUBITAL  Final   Special Requests   Final    BOTTLES DRAWN AEROBIC AND ANAEROBIC Blood Culture adequate volume   Culture   Final    NO GROWTH 5 DAYS Performed at [facility], [address], [place], [place] [address]    Report Status 02/17/2020 FINAL  Final  Blood culture (routine x 2)     Status: None   Collection Time: 02/12/20  7:42 PM   Specimen: BLOOD  Result Value Ref Range Status   Specimen Description BLOOD LEFT ANTECUBITAL  Final   Special Requests   Final    BOTTLES DRAWN AEROBIC AND ANAEROBIC Blood Culture adequate volume   Culture   Final    NO GROWTH 5 DAYS Performed at [facility], [address] [place],  [address]    Report Status 02/17/2020 FINAL  Final  Resp Panel by RT-PCR (Flu A&B, Covid) Nasopharyngeal Swab     Status: Abnormal   Collection Time: 02/15/20  5:52 PM   Specimen: Nasopharyngeal Swab; Nasopharyngeal(NP) swabs in vial transport medium  Result Value Ref Range Status   SARS Coronavirus 2 by RT PCR POSITIVE (A) NEGATIVE Final    Comment: RESULT CALLED TO, READ BACK BY AND VERIFIED WITH: JACKIE RN 02/15/20 AT 1901 BY ACR (NOTE) SARS-CoV-2 target nucleic acids are DETECTED.  The SARS-CoV-2  RNA is generally detectable in upper respiratory specimens during the acute phase of infection. Positive results are indicative of the presence of the identified virus, but do not rule out bacterial infection or co-infection with other  pathogens not detected by the test. Clinical correlation with patient history and other diagnostic information is necessary to determine patient infection status. The expected result is Negative.  Fact Sheet for Patients: EntrepreneurPulse.com.au  Fact Sheet for Healthcare Providers: IncredibleEmployment.be  This test is not yet approved or cleared by the [place] FDA and  has been authorized for detection and/or diagnosis of SARS-CoV-2 by FDA under an Emergency Use Authorization (EUA).  This EUA will remain in effect (meaning this test can be  used) for the duration of  the COVID-19 declaration under Section 564(b)(1) of the Act, 21 U.S.C. section 360bbb-3(b)(1), unless the authorization is terminated or revoked sooner.     Influenza A by PCR NEGATIVE NEGATIVE Final   Influenza B by PCR NEGATIVE NEGATIVE Final    Comment: (NOTE) The Xpert Xpress SARS-CoV-2/FLU/RSV plus assay is intended as an aid in the diagnosis of influenza from Nasopharyngeal swab specimens and should not be used as a sole basis for treatment. Nasal washings and aspirates are unacceptable for Xpert Xpress SARS-CoV-2/FLU/RSV testing.  Fact Sheet for Patients: EntrepreneurPulse.com.au  Fact Sheet for Healthcare Providers: IncredibleEmployment.be  This test is not yet approved or cleared by the [place] FDA and has been authorized for detection and/or diagnosis of SARS-CoV-2 by FDA under an Emergency Use Authorization (EUA). This EUA will remain in effect (meaning this test can be used) for the duration of the COVID-19 declaration under Section 564(b)(1) of the Act, 21 U.S.C. section 360bbb-3(b)(1), unless the authorization is terminated or revoked.  Performed at [facility], [address], [place], [place] [address]      Time coordinating discharge: Over 30 minutes  SIGNED:   Wyvonnia Dusky,  MD  [facility] 02/18/2020, 12:13 PM Pager   If 7PM-7AM, please contact night-coverage

## 2020-02-18 NOTE — Progress Notes (Signed)
Physical Therapy Treatment Patient Details Name: Brent [name]. MRN: [id number] DOB: [birth year] Today's Date: 02/18/2020    History of Present Illness Pt is a 75 y.o. male presenting to hospital 1/7 with SOB and hypoxemia; recent COVID diagnosis 01/29/20.  Pt admitted with acute hypoxic respiratory failure secondary COVID-19 PNA, IDDM with hyperglycemia, thrombocytosis, and leukocytosis.  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    PT Comments    Multiple laps in room pushing vitals monitor.  Is not interested in using RW here or at home.  No LOB or buckling noted.  Overall does well.  Pt on 2 lpm - pulse oxymeter not picking up reading but after short rest and holding monitor on for good contact, initially reads 88% and slowly increases.  Pt stated he feels well and hopes to discharge home today.   Follow Up Recommendations  No PT follow up     Equipment Recommendations  None recommended by PT    Recommendations for Other Services       Precautions / Restrictions Precautions Precautions: Fall Restrictions Weight Bearing Restrictions: No    Mobility  Bed Mobility Overal bed mobility: Modified Independent                Transfers Overall transfer level: Modified independent Equipment used: None                Ambulation/Gait Ambulation/Gait assistance: Supervision Gait Distance (Feet): 120 Feet Assistive device: None   Gait velocity: mildly decreased   General Gait Details: multiple laps in room with no difficulty, pushed vitals monitor but does not use for support.  is not interested in a Programmer, systems Rankin (Stroke Patients Only)       Balance Overall balance assessment: Modified Independent Sitting-balance support: No upper extremity supported;Feet supported Sitting balance-Leahy Scale: Normal     Standing balance support: Single extremity supported Standing balance-Leahy Scale:  Good Standing balance comment: no loss of balance with ambulation                            Cognition Arousal/Alertness: Awake/alert Behavior During Therapy: WFL for tasks assessed/performed Overall Cognitive Status: Within Functional Limits for tasks assessed                                        Exercises      General Comments        Pertinent Vitals/Pain Pain Assessment: No/denies pain    Home Living                      Prior Function            PT Goals (current goals can now be found in the care plan section) Progress towards PT goals: Progressing toward goals    Frequency    Min 2X/week      PT Plan      Co-evaluation              AM-PAC PT "6 Clicks" Mobility   Outcome Measure  Help needed turning from your back to your side while in a flat bed without using bedrails?: None Help needed moving from lying on your back to sitting on the  side of a flat bed without using bedrails?: None Help needed moving to and from a bed to a chair (including a wheelchair)?: None Help needed standing up from a chair using your arms (e.g., wheelchair or bedside chair)?: None Help needed to walk in hospital room?: A Little Help needed climbing 3-5 steps with a railing? : A Little 6 Click Score: 22    End of Session Equipment Utilized During Treatment: Gait belt;Oxygen (2 L O2 via HFNC) Activity Tolerance: Patient tolerated treatment well Patient left: in chair;with call bell/phone within reach Nurse Communication: Mobility status;Precautions;Other (comment) (pt's O2 status and pt left on 2 L O2 HFNC) PT Visit Diagnosis: Other abnormalities of gait and mobility (R26.89);Muscle weakness (generalized) (M62.81)     Time: 1120-1140 PT Time Calculation (min) (ACUTE ONLY): 20 min  Charges:  $Gait Training: 8-22 mins                    Chesley Noon, PTA 02/18/20, 11:51 AM

## 2020-02-25 DIAGNOSIS — Z8616 Personal history of COVID-19: Secondary | ICD-10-CM | POA: Insufficient documentation

## 2020-04-25 ENCOUNTER — Inpatient Hospital Stay: Payer: Medicare Other

## 2020-04-25 ENCOUNTER — Encounter: Payer: Self-pay | Admitting: Oncology

## 2020-04-25 ENCOUNTER — Inpatient Hospital Stay: Payer: Medicare Other | Attending: Oncology | Admitting: Oncology

## 2020-04-25 ENCOUNTER — Other Ambulatory Visit: Payer: Self-pay

## 2020-04-25 VITALS — BP 172/72 | HR 67 | Temp 97.6°F | Resp 20 | Wt 226.9 lb

## 2020-04-25 DIAGNOSIS — D72828 Other elevated white blood cell count: Secondary | ICD-10-CM | POA: Diagnosis present

## 2020-04-25 DIAGNOSIS — I10 Essential (primary) hypertension: Secondary | ICD-10-CM | POA: Insufficient documentation

## 2020-04-25 DIAGNOSIS — D580 Hereditary spherocytosis: Secondary | ICD-10-CM | POA: Insufficient documentation

## 2020-04-25 DIAGNOSIS — Z794 Long term (current) use of insulin: Secondary | ICD-10-CM

## 2020-04-25 DIAGNOSIS — Z9081 Acquired absence of spleen: Secondary | ICD-10-CM | POA: Insufficient documentation

## 2020-04-25 DIAGNOSIS — D729 Disorder of white blood cells, unspecified: Secondary | ICD-10-CM

## 2020-04-25 DIAGNOSIS — E119 Type 2 diabetes mellitus without complications: Secondary | ICD-10-CM | POA: Diagnosis not present

## 2020-04-25 DIAGNOSIS — D721 Eosinophilia, unspecified: Secondary | ICD-10-CM

## 2020-04-25 DIAGNOSIS — Z87891 Personal history of nicotine dependence: Secondary | ICD-10-CM | POA: Diagnosis not present

## 2020-04-25 DIAGNOSIS — Z8616 Personal history of COVID-19: Secondary | ICD-10-CM | POA: Insufficient documentation

## 2020-04-25 DIAGNOSIS — Z7984 Long term (current) use of oral hypoglycemic drugs: Secondary | ICD-10-CM | POA: Diagnosis not present

## 2020-04-25 DIAGNOSIS — D72824 Basophilia: Secondary | ICD-10-CM

## 2020-04-25 LAB — CBC WITH DIFFERENTIAL/PLATELET
Abs Immature Granulocytes: 0.08 10*3/uL — ABNORMAL HIGH (ref 0.00–0.07)
Basophils Absolute: 0.3 10*3/uL — ABNORMAL HIGH (ref 0.0–0.1)
Basophils Relative: 1 %
Eosinophils Absolute: 0.9 10*3/uL — ABNORMAL HIGH (ref 0.0–0.5)
Eosinophils Relative: 5 %
HCT: 46.8 % (ref 39.0–52.0)
Hemoglobin: 15.9 g/dL (ref 13.0–17.0)
Immature Granulocytes: 0 %
Lymphocytes Relative: 22 %
Lymphs Abs: 4.2 10*3/uL — ABNORMAL HIGH (ref 0.7–4.0)
MCH: 30.6 pg (ref 26.0–34.0)
MCHC: 34 g/dL (ref 30.0–36.0)
MCV: 90.2 fL (ref 80.0–100.0)
Monocytes Absolute: 1.7 10*3/uL — ABNORMAL HIGH (ref 0.1–1.0)
Monocytes Relative: 9 %
Neutro Abs: 11.7 10*3/uL — ABNORMAL HIGH (ref 1.7–7.7)
Neutrophils Relative %: 63 %
Platelets: 447 10*3/uL — ABNORMAL HIGH (ref 150–400)
RBC: 5.19 MIL/uL (ref 4.22–5.81)
RDW: 14 % (ref 11.5–15.5)
WBC: 18.9 10*3/uL — ABNORMAL HIGH (ref 4.0–10.5)
nRBC: 0 % (ref 0.0–0.2)

## 2020-04-25 LAB — LACTATE DEHYDROGENASE: LDH: 153 U/L (ref 98–192)

## 2020-04-25 LAB — PATHOLOGIST SMEAR REVIEW

## 2020-04-25 NOTE — Progress Notes (Signed)
Hematology/Oncology Consult note [facility] [phone] [phone]  Patient Care Team: [name], MD as PCP - General (Family Medicine)   Name of the patient: Brent [name]  [id number]  Aug 15, 1945    Reason for referral-leukocytosis   Referring physician-Dr. Netty Starring  Date of visit: 04/25/20   History of presenting illness-patient is a 75 year old male with past medical history significant for hereditary spherocytosis s/p splenectomy as a child, hypertension hyperlipidemia type 2 diabetes among other medical problems.  He recently had Covid infection requiring hospitalization in January 2022. He has been referred for leukocytosis looking back at his prior CBCs patient has had an elevated white cell count that fluctuates between 13-17.  Hemoglobin and platelets have been normal.  Differential is mainly showed eosinophilia basophilia as well as monocytosis and intermittent neutrophilia and lymphocytosis.  When patient was admitted for Covid infection his white cell count had gone up to 30 in January 2022.  Patient reports being at his baseline state of health.  He has baseline fatigue.  Appetite and weight have remained stable.  He did lose about 20 pounds when he had Covid.  ECOG PS- 1  Pain scale- 0   Review of systems- Review of Systems  Constitutional: Positive for malaise/fatigue. Negative for chills, fever and weight loss.  HENT: Negative for congestion, ear discharge and nosebleeds.   Eyes: Negative for blurred vision.  Respiratory: Negative for cough, hemoptysis, sputum production, shortness of breath and wheezing.   Cardiovascular: Negative for chest pain, palpitations, orthopnea and claudication.  Gastrointestinal: Negative for abdominal pain, blood in stool, constipation, diarrhea, heartburn, melena, nausea and vomiting.  Genitourinary: Negative for dysuria, flank pain, frequency, hematuria and urgency.  Musculoskeletal:  Negative for back pain, joint pain and myalgias.  Skin: Negative for rash.  Neurological: Negative for dizziness, tingling, focal weakness, seizures, weakness and headaches.  Endo/Heme/Allergies: Does not bruise/bleed easily.  Psychiatric/Behavioral: Negative for depression and suicidal ideas. The patient does not have insomnia.     No Known Allergies  Patient Active Problem List   Diagnosis Date Noted  . History of 2019 novel coronavirus disease (COVID-19) 02/25/2020  . COVID-19 virus infection 02/12/2020  . COVID-19 02/12/2020  . Chronic osteoarthritis 03/11/2019  . Dehydration 07/16/2018  . Elevated liver enzymes   . Abnormal CT of the abdomen   . Duodenal ulceration   . Choledocholithiasis 05/15/2018  . Psychophysiological insomnia 05/06/2018  . Other male erectile dysfunction 10/08/2017  . History of leukocytosis 03/05/2016  . Essential hypertension 10/15/2014  . Non morbid obesity due to excess calories 06/26/2013  . Pure hypercholesterolemia 06/26/2013  . Type 2 diabetes mellitus with diabetic neuropathy, without long-term current use of insulin ([place]) 06/26/2013  . Mixed hyperlipidemia 06/26/2013     Past Medical History:  Diagnosis Date  . Arthritis    FINGERS  . Diabetes mellitus without complication ([place])   . GERD (gastroesophageal reflux disease)    RARE  . Hyperlipidemia   . Hypertension   . Spherocytosis, hereditary ([place])    HAD SPLEEN REMOVED DUE TO THIS IN 7TH GRADE     Past Surgical History:  Procedure Laterality Date  . CARPAL TUNNEL RELEASE Left 10/2015  . CARPAL TUNNEL RELEASE Right 01/06/2020   Procedure: CARPAL TUNNEL RELEASE;  Surgeon: Dereck Leep, MD;  Location: [facility] ORS;  Service: Orthopedics;  Laterality: Right;  . CHOLECYSTECTOMY N/A 07/07/2018   Procedure: LAPAROSCOPIC CHOLECYSTECTOMY;  Surgeon: [name], MD;  Location: [facility] ORS;  Service: General;  Laterality: N/A;  .  COLONOSCOPY    . COLONOSCOPY WITH PROPOFOL N/A 08/05/2017    Procedure: COLONOSCOPY WITH PROPOFOL;  Surgeon: Manya Silvas, MD;  Location: [facility] ENDOSCOPY;  Service: Endoscopy;  Laterality: N/A;  . DEBRIDEMENT OF ABDOMINAL WALL ABSCESS N/A 07/24/2019   Procedure: DEBRIDEMENT OF ABDOMINAL WALL ABSCESS;  Surgeon: [name], MD;  Location: [facility] ORS;  Service: General;  Laterality: N/A;  . ENDOSCOPIC RETROGRADE CHOLANGIOPANCREATOGRAPHY (ERCP) WITH PROPOFOL N/A 05/16/2018   Procedure: ENDOSCOPIC RETROGRADE CHOLANGIOPANCREATOGRAPHY (ERCP) WITH PROPOFOL;  Surgeon: Lucilla Lame, MD;  Location: [facility] ENDOSCOPY;  Service: Endoscopy;  Laterality: N/A;  . SPLENECTOMY    . UMBILICAL HERNIA REPAIR N/A 07/24/2019   Procedure: HERNIA REPAIR UMBILICAL ADULT;  Surgeon: [name], MD;  Location: [facility] ORS;  Service: General;  Laterality: N/A;    Social History   Socioeconomic History  . Marital status: Married    Spouse name: Not on file  . Number of children: Not on file  . Years of education: Not on file  . Highest education level: Not on file  Occupational History  . Not on file  Tobacco Use  . Smoking status: Former Smoker    Packs/day: 1.00    Types: Cigarettes    Quit date: 07/01/1988    Years since quitting: 31.8  . Smokeless tobacco: Never Used  Vaping Use  . Vaping Use: Never used  Substance and Sexual Activity  . Alcohol use: Not Currently  . Drug use: Never  . Sexual activity: Not on file  Other Topics Concern  . Not on file  Social History Narrative  . Not on file   Social Determinants of Health   Financial Resource Strain: Not on file  Food Insecurity: Not on file  Transportation Needs: Not on file  Physical Activity: Not on file  Stress: Not on file  Social Connections: Not on file  Intimate Partner Violence: Not on file     History reviewed. No pertinent family history.   Current Outpatient Medications:  .  acetaminophen (TYLENOL) 325 MG tablet, Take 325 mg by mouth every 6 (six) hours as needed for mild  pain., Disp: , Rfl:  .  aspirin EC 81 MG tablet, Take 81 mg by mouth daily. , Disp: , Rfl:  .  atorvastatin (LIPITOR) 40 MG tablet, Take 40 mg by mouth at bedtime. , Disp: , Rfl:  .  lisinopril (PRINIVIL,ZESTRIL) 40 MG tablet, Take 40 mg by mouth every morning. , Disp: , Rfl:  .  Melatonin 10 MG TABS, Take 10 mg by mouth at bedtime., Disp: , Rfl:  .  metFORMIN (GLUCOPHAGE) 500 MG tablet, Take 500-1,000 mg by mouth See admin instructions. Take 1000 mg by mouth in the morning and 500 mg at night, Disp: , Rfl:  .  Multiple Vitamins-Minerals (MENS 50+ MULTI VITAMIN/MIN) TABS, Take by mouth., Disp: , Rfl:  .  naproxen sodium (ALEVE) 220 MG tablet, Take 220-440 mg by mouth daily as needed (pain). , Disp: , Rfl:  .  Omega-3 Fatty Acids (FISH OIL) 1000 MG CAPS, Take 1,000 mg by mouth in the morning and at bedtime., Disp: , Rfl:  .  sildenafil (VIAGRA) 100 MG tablet, Take 100 mg by mouth at bedtime as needed for erectile dysfunction. , Disp: , Rfl:    Physical exam:  Vitals:   04/25/20 1128  BP: (!) 172/72  Pulse: 67  Resp: 20  Temp: 97.6 F (36.4 C)  TempSrc: Tympanic  SpO2: 97%  Weight: 226 lb 14.4 oz (102.9 kg)  Physical Exam Constitutional:      General: He is not in acute distress. Cardiovascular:     Rate and Rhythm: Normal rate and regular rhythm.     Heart sounds: Normal heart sounds.  Pulmonary:     Effort: Pulmonary effort is normal.     Breath sounds: Normal breath sounds.  Abdominal:     General: Bowel sounds are normal.     Palpations: Abdomen is soft.  Lymphadenopathy:     Comments: No palpable cervical, supraclavicular, axillary or inguinal adenopathy   Skin:    General: Skin is warm and dry.  Neurological:     Mental Status: He is alert and oriented to person, place, and time.        CMP Latest Ref Rng & Units 02/18/2020  Glucose 70 - 99 mg/dL 64(L)  BUN 8 - 23 mg/dL 71(H)  Creatinine 0.61 - 1.24 mg/dL 1.61(H)  Sodium 135 - 145 mmol/L 136  Potassium 3.5 -  5.1 mmol/L 4.9  Chloride 98 - 111 mmol/L 104  CO2 22 - 32 mmol/L 24  Calcium 8.9 - 10.3 mg/dL 9.5  Total Protein 6.5 - 8.1 g/dL -  Total Bilirubin 0.3 - 1.2 mg/dL -  Alkaline Phos 38 - 126 U/L -  AST 15 - 41 U/L -  ALT 0 - 44 U/L -   CBC Latest Ref Rng & Units 04/25/2020  WBC 4.0 - 10.5 K/uL 18.9(H)  Hemoglobin 13.0 - 17.0 g/dL 15.9  Hematocrit 39.0 - 52.0 % 46.8  Platelets 150 - 400 K/uL 447(H)    Assessment and plan- Patient is a 75 y.o. male with history of splenectomy in the past and recent Covid infection referred for leukocytosis  Patient had recent Covid infection in January 2022 when his white cell count went up to 30.Although his white count is elevated presently at 18 this appears to be trending down as compared to his values in January 2022.  I suspect that he has baseline leukocytosis following splenectomy which exacerbated in the setting of Covid infection.  However I will check a CBC with differential, smear review, BCR ABL as well as flow cytometry testing.  I will see him back in about 2 weeks time for a video visit.  Bone marrow biopsy is not indicated at this time  Thank you for this kind referral and the opportunity to participate in the care of this patient   Visit Diagnosis 1. Neutrophilia   2. Eosinophilia, unspecified type   3. Basophilia     Dr. Randa Evens, MD, MPH [facility] at [facility] [phone] 04/25/2020 1:14 PM

## 2020-04-27 LAB — COMP PANEL: LEUKEMIA/LYMPHOMA

## 2020-04-28 LAB — BCR-ABL1 FISH
Cells Analyzed: 200
Cells Counted: 200

## 2020-05-10 ENCOUNTER — Encounter: Payer: Self-pay | Admitting: Oncology

## 2020-05-10 NOTE — Progress Notes (Signed)
Pt. To get results of labs drawn, no pain per pt. The rest of info. Provided by wife because pt. Said she knows the meds

## 2020-05-11 ENCOUNTER — Inpatient Hospital Stay: Payer: Medicare Other | Attending: Oncology | Admitting: Oncology

## 2020-05-11 ENCOUNTER — Other Ambulatory Visit: Payer: Self-pay

## 2020-05-11 DIAGNOSIS — D729 Disorder of white blood cells, unspecified: Secondary | ICD-10-CM

## 2020-05-18 NOTE — Progress Notes (Signed)
I connected with Brent [name] on 05/18/20 at  8:30 AM EDT by video enabled telemedicine visit and verified that I am speaking with the correct person using two identifiers.   I discussed the limitations, risks, security and privacy concerns of performing an evaluation and management service by telemedicine and the availability of in-person appointments. I also discussed with the patient that there may be a patient responsible charge related to this service. The patient expressed understanding and agreed to proceed.  Other persons participating in the visit and their role in the encounter:  none  Patient's location:  home Provider's location:  work  Risk analyst Complaint: Discuss results of blood work  History of present illness: patient is a 75 year old male with past medical history significant for hereditary spherocytosis s/p splenectomy as a child, hypertension hyperlipidemia type 2 diabetes among other medical problems.  He recently had Covid infection requiring hospitalization in January 2022. He has been referred for leukocytosis looking back at his prior CBCs patient has had an elevated white cell count that fluctuates between 13-17.  Hemoglobin and platelets have been normal.  Differential is mainly showed eosinophilia basophilia as well as monocytosis and intermittent neutrophilia and lymphocytosis.  When patient was admitted for Covid infection his white cell count had gone up to 30 in January 2022.  Patient reports being at his baseline state of health.  He has baseline fatigue.  Appetite and weight have remained stable.  He did lose about 20 pounds when he had Covid.  Results of blood work from 04/25/2020 were as follows: CBC showed white count of 18.9, H&H of 15.9/46.8 with an MCV of 90 and a platelet count of 447.  Flow cytometry did not show any evidence of immunophenotypic abnormality.  BCR ABL FISH was negative.  LDH normal at 153.  Interval history patient reports doing well overall and denies  any specific complaints at this time other than mild fatigue   Review of Systems  Constitutional: Positive for malaise/fatigue. Negative for chills, fever and weight loss.  HENT: Negative for congestion, ear discharge and nosebleeds.   Eyes: Negative for blurred vision.  Respiratory: Negative for cough, hemoptysis, sputum production, shortness of breath and wheezing.   Cardiovascular: Negative for chest pain, palpitations, orthopnea and claudication.  Gastrointestinal: Negative for abdominal pain, blood in stool, constipation, diarrhea, heartburn, melena, nausea and vomiting.  Genitourinary: Negative for dysuria, flank pain, frequency, hematuria and urgency.  Musculoskeletal: Negative for back pain, joint pain and myalgias.  Skin: Negative for rash.  Neurological: Negative for dizziness, tingling, focal weakness, seizures, weakness and headaches.  Endo/Heme/Allergies: Does not bruise/bleed easily.  Psychiatric/Behavioral: Negative for depression and suicidal ideas. The patient does not have insomnia.     No Known Allergies  Past Medical History:  Diagnosis Date  . Arthritis    FINGERS  . Diabetes mellitus without complication ([place])   . GERD (gastroesophageal reflux disease)    RARE  . Hyperlipidemia   . Hypertension   . Spherocytosis, hereditary ([place])    HAD SPLEEN REMOVED DUE TO THIS IN 7TH GRADE    Past Surgical History:  Procedure Laterality Date  . CARPAL TUNNEL RELEASE Left 10/2015  . CARPAL TUNNEL RELEASE Right 01/06/2020   Procedure: CARPAL TUNNEL RELEASE;  Surgeon: Dereck Leep, MD;  Location: [facility] ORS;  Service: Orthopedics;  Laterality: Right;  . CHOLECYSTECTOMY N/A 07/07/2018   Procedure: LAPAROSCOPIC CHOLECYSTECTOMY;  Surgeon: [name], MD;  Location: [facility] ORS;  Service: General;  Laterality: N/A;  . COLONOSCOPY    .  COLONOSCOPY WITH PROPOFOL N/A 08/05/2017   Procedure: COLONOSCOPY WITH PROPOFOL;  Surgeon: Manya Silvas, MD;  Location: [facility]  ENDOSCOPY;  Service: Endoscopy;  Laterality: N/A;  . DEBRIDEMENT OF ABDOMINAL WALL ABSCESS N/A 07/24/2019   Procedure: DEBRIDEMENT OF ABDOMINAL WALL ABSCESS;  Surgeon: [name], MD;  Location: [facility] ORS;  Service: General;  Laterality: N/A;  . ENDOSCOPIC RETROGRADE CHOLANGIOPANCREATOGRAPHY (ERCP) WITH PROPOFOL N/A 05/16/2018   Procedure: ENDOSCOPIC RETROGRADE CHOLANGIOPANCREATOGRAPHY (ERCP) WITH PROPOFOL;  Surgeon: Lucilla Lame, MD;  Location: [facility] ENDOSCOPY;  Service: Endoscopy;  Laterality: N/A;  . SPLENECTOMY    . UMBILICAL HERNIA REPAIR N/A 07/24/2019   Procedure: HERNIA REPAIR UMBILICAL ADULT;  Surgeon: [name], MD;  Location: [facility] ORS;  Service: General;  Laterality: N/A;    Social History   Socioeconomic History  . Marital status: Married    Spouse name: Not on file  . Number of children: Not on file  . Years of education: Not on file  . Highest education level: Not on file  Occupational History  . Not on file  Tobacco Use  . Smoking status: Former Smoker    Packs/day: 1.00    Types: Cigarettes    Quit date: 07/01/1988    Years since quitting: 31.9  . Smokeless tobacco: Never Used  Vaping Use  . Vaping Use: Never used  Substance and Sexual Activity  . Alcohol use: Not Currently  . Drug use: Never  . Sexual activity: Not on file  Other Topics Concern  . Not on file  Social History Narrative  . Not on file   Social Determinants of Health   Financial Resource Strain: Not on file  Food Insecurity: Not on file  Transportation Needs: Not on file  Physical Activity: Not on file  Stress: Not on file  Social Connections: Not on file  Intimate Partner Violence: Not on file    History reviewed. No pertinent family history.   Current Outpatient Medications:  .  acetaminophen (TYLENOL) 325 MG tablet, Take 325 mg by mouth every 6 (six) hours as needed for mild pain., Disp: , Rfl:  .  aspirin EC 81 MG tablet, Take 81 mg by mouth daily. , Disp: ,  Rfl:  .  atorvastatin (LIPITOR) 40 MG tablet, Take 40 mg by mouth at bedtime. , Disp: , Rfl:  .  lisinopril (PRINIVIL,ZESTRIL) 40 MG tablet, Take 40 mg by mouth every morning. , Disp: , Rfl:  .  Melatonin 10 MG TABS, Take 10 mg by mouth at bedtime., Disp: , Rfl:  .  metFORMIN (GLUCOPHAGE) 500 MG tablet, Take 500-1,000 mg by mouth See admin instructions. Take 1000 mg by mouth in the morning and 500 mg at night, Disp: , Rfl:  .  Multiple Vitamins-Minerals (MENS 50+ MULTI VITAMIN/MIN) TABS, Take 1 tablet by mouth daily., Disp: , Rfl:  .  naproxen sodium (ALEVE) 220 MG tablet, Take 220-440 mg by mouth daily as needed (pain). , Disp: , Rfl:  .  Omega-3 Fatty Acids (FISH OIL) 1000 MG CAPS, Take 1,000 mg by mouth in the morning and at bedtime., Disp: , Rfl:  .  sildenafil (VIAGRA) 100 MG tablet, Take 100 mg by mouth at bedtime as needed for erectile dysfunction. , Disp: , Rfl:   No results found.  No images are attached to the encounter.   CMP Latest Ref Rng & Units 02/18/2020  Glucose 70 - 99 mg/dL 64(L)  BUN 8 - 23 mg/dL 71(H)  Creatinine 0.61 - 1.24 mg/dL 1.61(H)  Sodium  135 - 145 mmol/L 136  Potassium 3.5 - 5.1 mmol/L 4.9  Chloride 98 - 111 mmol/L 104  CO2 22 - 32 mmol/L 24  Calcium 8.9 - 10.3 mg/dL 9.5  Total Protein 6.5 - 8.1 g/dL -  Total Bilirubin 0.3 - 1.2 mg/dL -  Alkaline Phos 38 - 126 U/L -  AST 15 - 41 U/L -  ALT 0 - 44 U/L -   CBC Latest Ref Rng & Units 04/25/2020  WBC 4.0 - 10.5 K/uL 18.9(H)  Hemoglobin 13.0 - 17.0 g/dL 15.9  Hematocrit 39.0 - 52.0 % 46.8  Platelets 150 - 400 K/uL 447(H)     Observation/objective: Appears in no acute distress over video visit today.  Breathing is nonlabored  Assessment and plan: Patient is a 75 year old male referred for leukocytosis likely reactive here to discuss results of blood work  Suspect patient's leukocytosis is reactive secondary to prior splenectomy which was exaggerated in the setting of recent Covid.   His white cell  count went up to 32 in January 2022 and is now down to 18.9.  Platelets were also elevated at 1 million and now down to 447.  Peripheral flow cytometry and BCR ABL FISH testing were also unremarkable.  This can be monitored conservatively by Dr. Netty Starring and patient does not require any follow-up with hematology at this time   Follow-up instructions: No follow-up needed  I discussed the assessment and treatment plan with the patient. The patient was provided an opportunity to ask questions and all were answered. The patient agreed with the plan and demonstrated an understanding of the instructions.   The patient was advised to call back or seek an in-person evaluation if the symptoms worsen or if the condition fails to improve as anticipated.   Visit Diagnosis: 1. Neutrophilia     Dr. Randa Evens, MD, MPH [facility] at [facility] Tel- [phone] 05/18/2020 12:00 PM

## 2020-09-05 ENCOUNTER — Other Ambulatory Visit: Payer: Self-pay | Admitting: Family Medicine

## 2020-09-05 ENCOUNTER — Other Ambulatory Visit (HOSPITAL_COMMUNITY): Payer: Self-pay | Admitting: Family Medicine

## 2020-09-05 DIAGNOSIS — M545 Low back pain, unspecified: Secondary | ICD-10-CM

## 2020-09-15 ENCOUNTER — Other Ambulatory Visit: Payer: Self-pay

## 2020-09-15 ENCOUNTER — Ambulatory Visit
Admission: RE | Admit: 2020-09-15 | Discharge: 2020-09-15 | Disposition: A | Discharge status: Home / Self Care | Payer: Medicare Other | Source: Ambulatory Visit | Attending: Family Medicine | Admitting: Family Medicine

## 2020-09-15 DIAGNOSIS — M545 Low back pain, unspecified: Secondary | ICD-10-CM | POA: Diagnosis present

## 2020-10-15 IMAGING — CT CT ABDOMEN AND PELVIS WITH CONTRAST
2 of 5 series · 16 of 46 positions shown, 18 images · IV contrast (APPLIED)
Comparison: 05/16/2018

CLINICAL DATA: Elevated WBC, antibiotics, cholecystectomy
07/07/2018

EXAM:
CT ABDOMEN AND PELVIS WITH CONTRAST
TECHNIQUE: Multidetector CT imaging of the abdomen and pelvis was performed
using the standard protocol following bolus administration of
intravenous contrast.
CONTRAST:  100mL OMNIPAQUE IOHEXOL 300 MG/ML SOLN, additional oral
enteric contrast

[Series 2: axial st · axial · 0.87mm/px · z∈[-734,-274]mm · 13 of 106 slices shown, 15 images]
[im 7/106  soft-tissue]
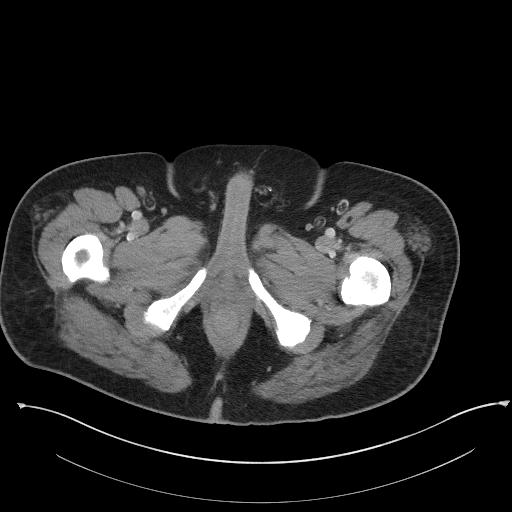
[im 7/106  bone]
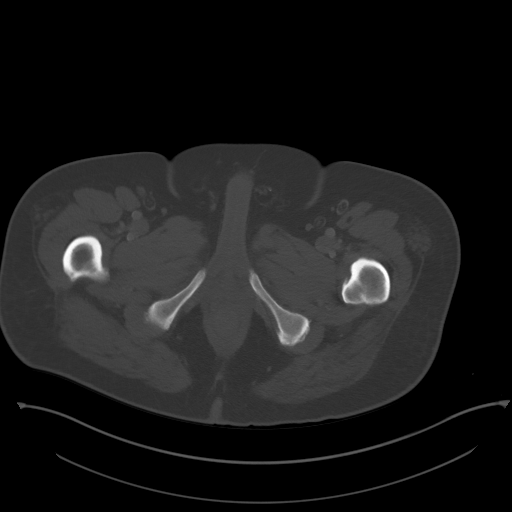
[im 14/106  soft-tissue]
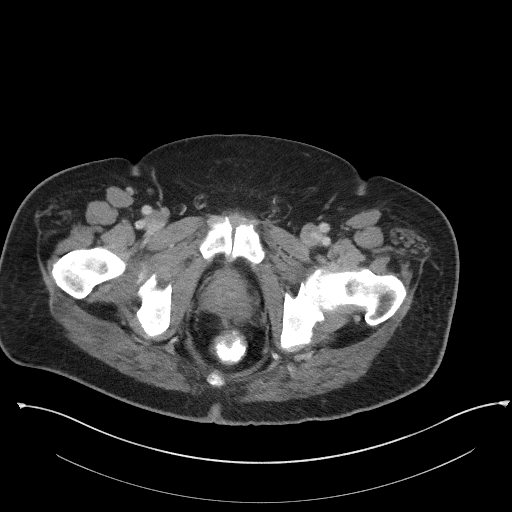
[im 20/106  soft-tissue]
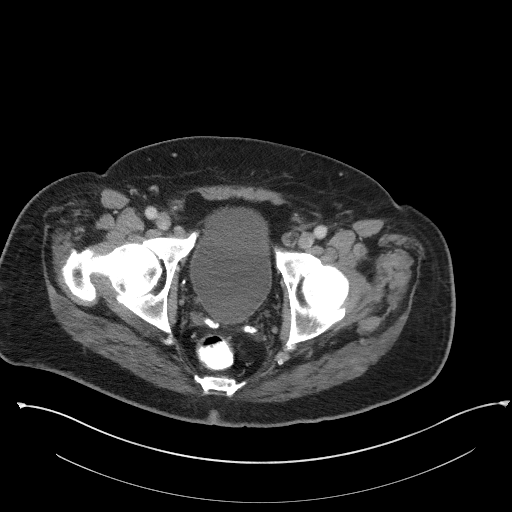
[im 33/106  soft-tissue]
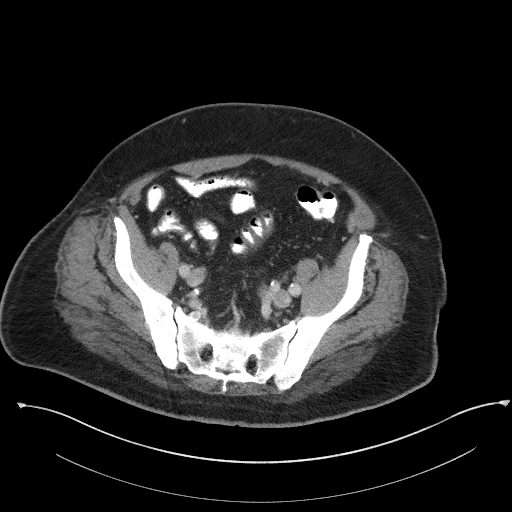
[im 40/106  soft-tissue]
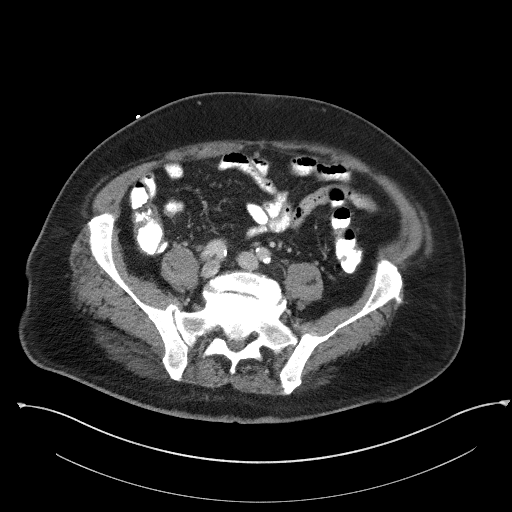
[im 46/106  soft-tissue]
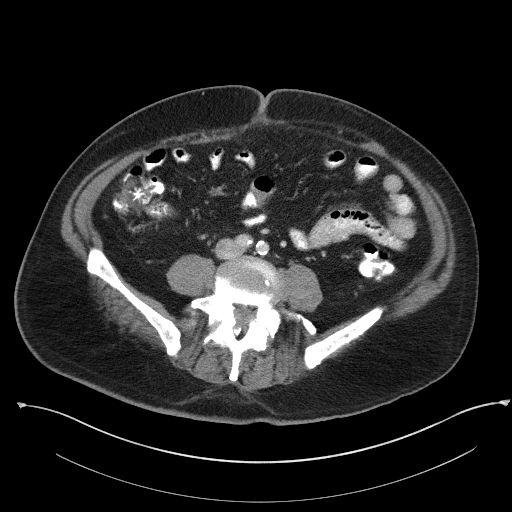
[im 53/106  soft-tissue]
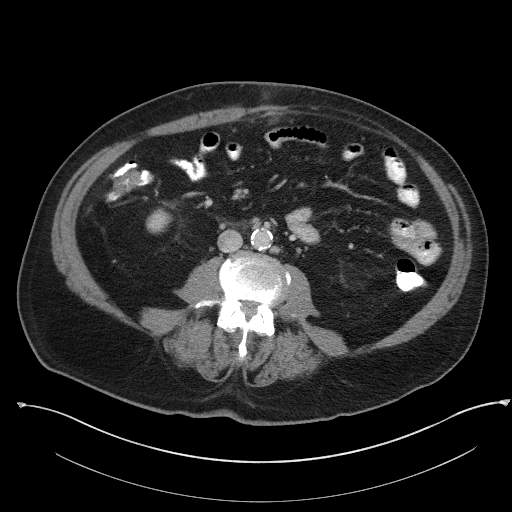
[im 60/106  soft-tissue]
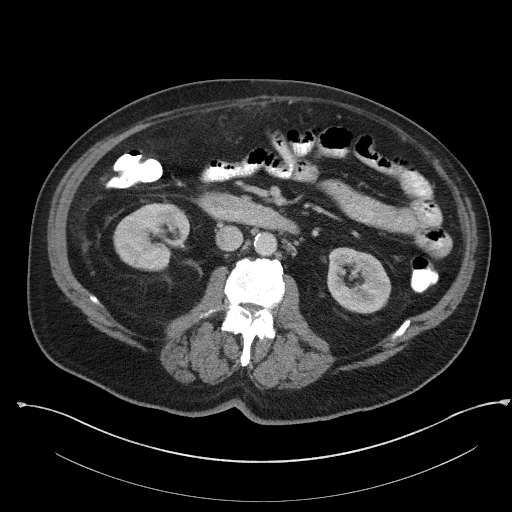
[im 66/106  soft-tissue]
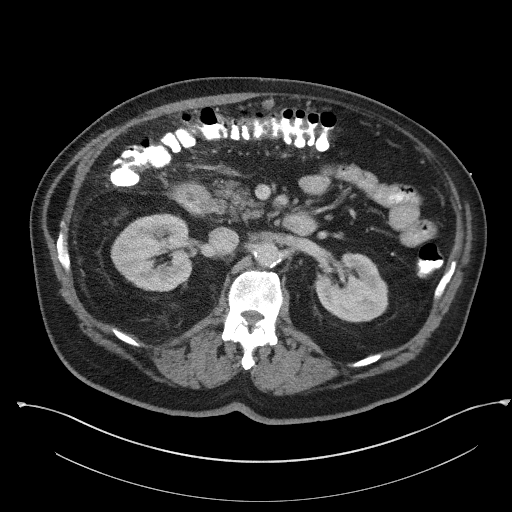
[im 66/106  bone]
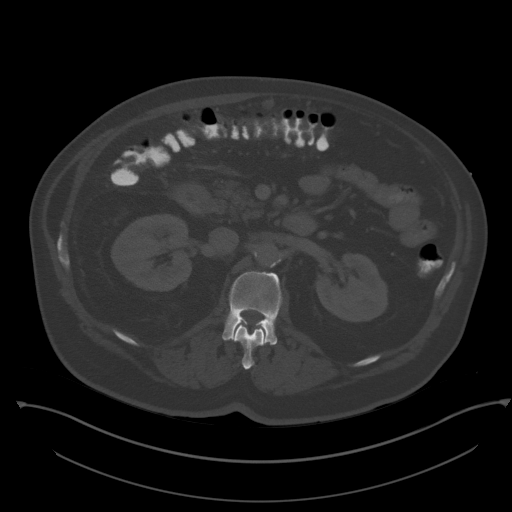
[im 73/106  soft-tissue]
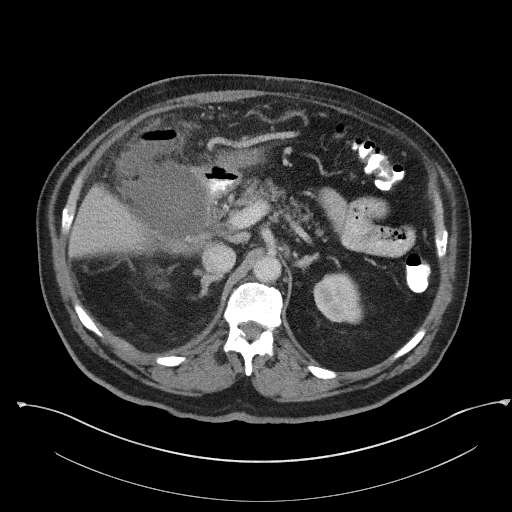
[im 86/106  soft-tissue]
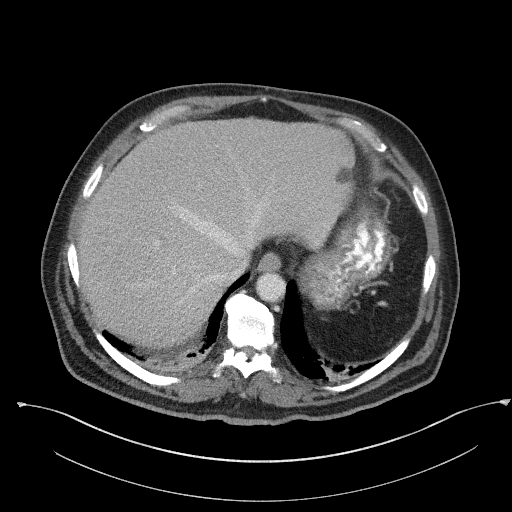
[im 92/106  soft-tissue]
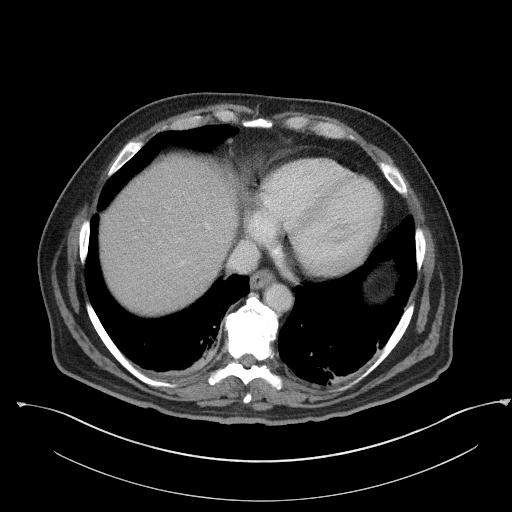
[im 99/106  soft-tissue]
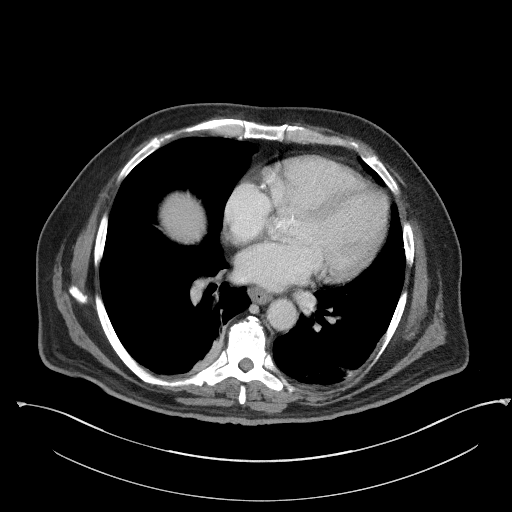

[Series 6: coronal st · coronal · 0.83mm/px · 3 of 105 slices shown]
[im 35/105  soft-tissue]
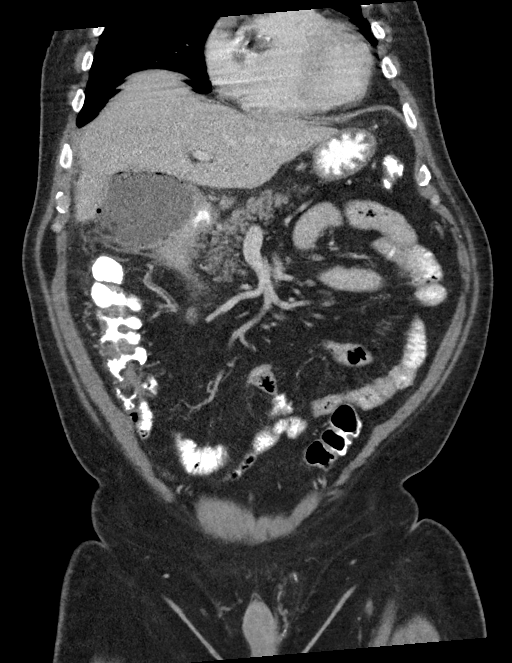
[im 47/105  soft-tissue]
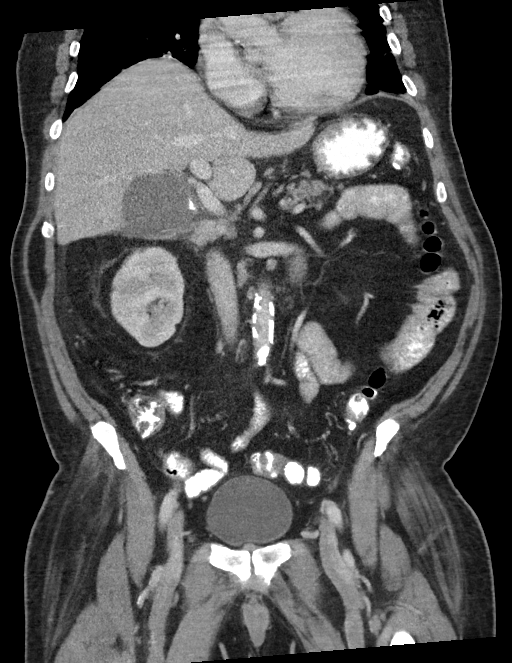
[im 58/105  soft-tissue]
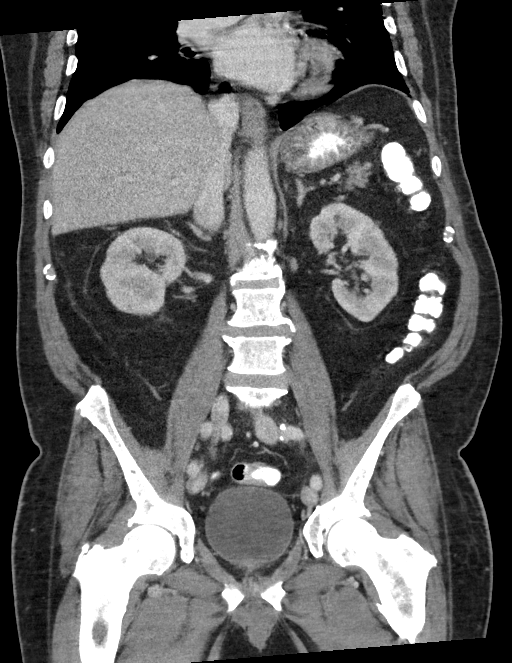

[16 of 46 positions shown; findings below may reference images not displayed]

FINDINGS: Lower chest: Bibasilar atelectasis or scarring. Coronary artery
calcifications.

Hepatobiliary: No solid liver abnormality is seen. There is a fairly
well-circumscribed air and fluid collection in the gallbladder fossa
measuring 10.6 x 7.0 cm (series 2, image 31).

Pancreas: Unremarkable. No pancreatic ductal dilatation or
surrounding inflammatory changes.

Spleen: Status post splenectomy with postoperative splenosis in the
left upper quadrant.

Adrenals/Urinary Tract: Adrenal glands are unremarkable. Kidneys are
normal, without renal calculi, solid lesion, or hydronephrosis.
Bladder is unremarkable.

Stomach/Bowel: Stomach is within normal limits. Appendix appears
normal. No evidence of bowel wall thickening, distention, or
inflammatory changes.

Vascular/Lymphatic: Aortic atherosclerosis. No enlarged abdominal or
pelvic lymph nodes.

Reproductive: No mass or other significant abnormality.

Other: No abdominal wall hernia or abnormality. No abdominopelvic
ascites.

Musculoskeletal: No acute or significant osseous findings.
IMPRESSION: 1. There is a fairly well-circumscribed air and fluid collection in
the gallbladder fossa measuring 10.6 x 7.0 cm (series 2, image 31),
concerning for abscess and/or biloma.

2.  Other chronic and incidental findings as detailed above.

## 2020-10-31 DIAGNOSIS — M545 Low back pain, unspecified: Secondary | ICD-10-CM | POA: Insufficient documentation

## 2021-04-18 ENCOUNTER — Encounter: Payer: Self-pay | Admitting: Internal Medicine

## 2021-04-19 ENCOUNTER — Ambulatory Visit
Admission: RE | Admit: 2021-04-19 | Discharge: 2021-04-19 | Disposition: A | Discharge status: Home / Self Care | Payer: Medicare Other | Attending: Internal Medicine | Admitting: Internal Medicine

## 2021-04-19 ENCOUNTER — Ambulatory Visit: Payer: Medicare Other | Admitting: Anesthesiology

## 2021-04-19 ENCOUNTER — Encounter
Admission: RE | Disposition: A | Discharge status: Home / Self Care | Payer: Self-pay | Source: Home / Self Care | Attending: Internal Medicine

## 2021-04-19 DIAGNOSIS — E785 Hyperlipidemia, unspecified: Secondary | ICD-10-CM | POA: Diagnosis not present

## 2021-04-19 DIAGNOSIS — D124 Benign neoplasm of descending colon: Secondary | ICD-10-CM | POA: Diagnosis not present

## 2021-04-19 DIAGNOSIS — D125 Benign neoplasm of sigmoid colon: Secondary | ICD-10-CM | POA: Insufficient documentation

## 2021-04-19 DIAGNOSIS — K5730 Diverticulosis of large intestine without perforation or abscess without bleeding: Secondary | ICD-10-CM | POA: Diagnosis not present

## 2021-04-19 DIAGNOSIS — E669 Obesity, unspecified: Secondary | ICD-10-CM | POA: Diagnosis not present

## 2021-04-19 DIAGNOSIS — M19049 Primary osteoarthritis, unspecified hand: Secondary | ICD-10-CM | POA: Insufficient documentation

## 2021-04-19 DIAGNOSIS — K219 Gastro-esophageal reflux disease without esophagitis: Secondary | ICD-10-CM | POA: Insufficient documentation

## 2021-04-19 DIAGNOSIS — I10 Essential (primary) hypertension: Secondary | ICD-10-CM | POA: Diagnosis not present

## 2021-04-19 DIAGNOSIS — Z9081 Acquired absence of spleen: Secondary | ICD-10-CM | POA: Diagnosis not present

## 2021-04-19 DIAGNOSIS — K640 First degree hemorrhoids: Secondary | ICD-10-CM | POA: Diagnosis not present

## 2021-04-19 DIAGNOSIS — Z87891 Personal history of nicotine dependence: Secondary | ICD-10-CM | POA: Insufficient documentation

## 2021-04-19 DIAGNOSIS — Z6836 Body mass index (BMI) 36.0-36.9, adult: Secondary | ICD-10-CM | POA: Diagnosis not present

## 2021-04-19 DIAGNOSIS — Z09 Encounter for follow-up examination after completed treatment for conditions other than malignant neoplasm: Secondary | ICD-10-CM | POA: Diagnosis present

## 2021-04-19 DIAGNOSIS — E119 Type 2 diabetes mellitus without complications: Secondary | ICD-10-CM | POA: Diagnosis not present

## 2021-04-19 DIAGNOSIS — Z8711 Personal history of peptic ulcer disease: Secondary | ICD-10-CM | POA: Insufficient documentation

## 2021-04-19 DIAGNOSIS — Z7984 Long term (current) use of oral hypoglycemic drugs: Secondary | ICD-10-CM | POA: Diagnosis not present

## 2021-04-19 HISTORY — DX: Obesity, unspecified: E66.9

## 2021-04-19 HISTORY — PX: COLONOSCOPY WITH PROPOFOL: SHX5780

## 2021-04-19 LAB — GLUCOSE, CAPILLARY: Glucose-Capillary: 194 mg/dL — ABNORMAL HIGH (ref 70–99)

## 2021-04-19 SURGERY — COLONOSCOPY WITH PROPOFOL
Anesthesia: General

## 2021-04-19 MED ORDER — PROPOFOL 10 MG/ML IV BOLUS
INTRAVENOUS | Status: DC | PRN
  Administered 2021-04-19: 10 mg via INTRAVENOUS
  Administered 2021-04-19: 60 mg via INTRAVENOUS

## 2021-04-19 MED ORDER — PROPOFOL 500 MG/50ML IV EMUL
INTRAVENOUS | Status: DC | PRN
  Administered 2021-04-19: 155 ug/kg/min via INTRAVENOUS

## 2021-04-19 MED ORDER — SODIUM CHLORIDE 0.9 % IV SOLN
INTRAVENOUS | Status: DC
  Administered 2021-04-19: 20 mL/h via INTRAVENOUS

## 2021-04-19 MED ORDER — LIDOCAINE HCL (CARDIAC) PF 100 MG/5ML IV SOSY
PREFILLED_SYRINGE | INTRAVENOUS | Status: DC | PRN
  Administered 2021-04-19: 100 mg via INTRAVENOUS

## 2021-04-19 NOTE — Anesthesia Postprocedure Evaluation (Signed)
Anesthesia Post Note ? ?Patient: Brent [name]. ? ?Procedure(s) Performed: COLONOSCOPY WITH PROPOFOL ? ?Patient location during evaluation: Endoscopy ?Anesthesia Type: General ?Level of consciousness: awake and alert ?Pain management: pain level controlled ?Vital Signs Assessment: post-procedure vital signs reviewed and stable ?Respiratory status: spontaneous breathing, nonlabored ventilation, respiratory function stable and patient connected to nasal cannula oxygen ?Cardiovascular status: blood pressure returned to baseline and stable ?Postop Assessment: no apparent nausea or vomiting ?Anesthetic complications: no ? ? ?No notable events documented. ? ? ?Last Vitals:  ?Vitals:  ? 04/19/21 1034 04/19/21 1044  ?BP: 123/78 (!) 144/86  ?Pulse: 78 73  ?Resp: 18 18  ?Temp: (!) 36.4 ?C (!) 36.4 ?C  ?SpO2: 96% 94%  ?  ?Last Pain:  ?Vitals:  ? 04/19/21 1044  ?TempSrc: Temporal  ?PainSc: 0-No pain  ? ? ?  ?  ?  ?  ?  ?  ? ?Arita Miss ? ? ? ? ?

## 2021-04-19 NOTE — Op Note (Signed)
[facility] ?Gastroenterology ?Patient Name: Brent [name] ?Procedure Date: 04/19/2021 9:58 AM ?MRN: [id number] ?Account #: [id number] ?Date of Birth: [birth year] ?Admit Type: Outpatient ?Age: 76 ?Room: [facility] ENDO ROOM 2 ?Gender: Male ?Note Status: Finalized ?Instrument Name: Colonoscope [id number] ?Procedure:             Colonoscopy ?Indications:           Surveillance: Personal history of adenomatous polyps  ?                       on last colonoscopy > 3 years ago ?Providers:             [name]. Alice Reichert MD, MD ?Referring MD:          [name] (Referring MD) ?Medicines:             Propofol per Anesthesia ?Complications:         No immediate complications. ?Procedure:             Pre-Anesthesia Assessment: ?                       - The risks and benefits of the procedure and the  ?                       sedation options and risks were discussed with the  ?                       patient. All questions were answered and informed  ?                       consent was obtained. ?                       - Patient identification and proposed procedure were  ?                       verified prior to the procedure by the nurse. The  ?                       procedure was verified in the procedure room. ?                       - ASA Grade Assessment: III - A patient with severe  ?                       systemic disease. ?                       - After reviewing the risks and benefits, the patient  ?                       was deemed in satisfactory condition to undergo the  ?                       procedure. ?                       After obtaining informed consent, the colonoscope was  ?                       passed  under direct vision. Throughout the procedure,  ?                       the patient's blood pressure, pulse, and oxygen  ?                       saturations were monitored continuously. The  ?                       Colonoscope was introduced through the anus and  ?                       advanced  to the the cecum, identified by appendiceal  ?                       orifice and ileocecal valve. The colonoscopy was  ?                       performed without difficulty. The patient tolerated  ?                       the procedure well. The quality of the bowel  ?                       preparation was adequate. The ileocecal valve,  ?                       appendiceal orifice, and rectum were photographed. ?Findings: ?     The perianal and digital rectal examinations were normal. Pertinent  ?     negatives include normal sphincter tone and no palpable rectal lesions. ?     Non-bleeding internal hemorrhoids were found during retroflexion. The  ?     hemorrhoids were Grade I (internal hemorrhoids that do not prolapse). ?     Many small-mouthed diverticula were found in the left colon. There was  ?     no evidence of diverticular bleeding. ?     Two sessile polyps were found in the sigmoid colon and descending colon.  ?     The polyps were 4 to 5 mm in size. These polyps were removed with a  ?     jumbo cold forceps. Resection and retrieval were complete. ?     The exam was otherwise without abnormality. ?Impression:            - Non-bleeding internal hemorrhoids. ?                       - Mild diverticulosis in the left colon. There was no  ?                       evidence of diverticular bleeding. ?                       - Two 4 to 5 mm polyps in the sigmoid colon and in the  ?                       descending colon, removed with a jumbo cold forceps.  ?  Resected and retrieved. ?                       - The examination was otherwise normal. ?Recommendation:        - Patient has a contact number available for  ?                       emergencies. The signs and symptoms of potential  ?                       delayed complications were discussed with the patient.  ?                       Return to normal activities tomorrow. Written  ?                       discharge instructions were provided to  the patient. ?                       - Resume previous diet. ?                       - Continue present medications. ?                       - Await pathology results. ?                       - If polyps are benign or adenomatous without  ?                       dysplasia, I will advise NO further colonoscopy due to  ?                       advanced age and/or severe comorbidity. ?                       - Return to GI office PRN. ?                       - The findings and recommendations were discussed with  ?                       the patient. ?Procedure Code(s):     --- Professional --- ?                       [phone], Colonoscopy, flexible; with biopsy, single or  ?                       multiple ?Diagnosis Code(s):     --- Professional --- ?                       K57.30, Diverticulosis of large intestine without  ?                       perforation or abscess without bleeding ?                       K63.5, Polyp of colon ?  K64.0, First degree hemorrhoids ?                       Z86.010, Personal history of colonic polyps ?CPT copyright 2019 American Medical Association. All rights reserved. ?The codes documented in this report are preliminary and upon coder review may  ?be revised to meet current compliance requirements. ?Efrain Sella MD, MD ?04/19/2021 10:25:50 AM ?This report has been signed electronically. ?Number of Addenda: 0 ?Note Initiated On: 04/19/2021 9:58 AM ?Scope Withdrawal Time: 0 hours 5 minutes 55 seconds  ?Total Procedure Duration: 0 hours 9 minutes 57 seconds  ?Estimated Blood Loss:  Estimated blood loss: none. ?     [facility] ?

## 2021-04-19 NOTE — H&P (Signed)
Outpatient short stay form Pre-procedure ?04/19/2021 9:26 AM ?Brent [name] K. Alice Reichert, M.D. ? ?Primary Physician: [name], M.D. ? ?Reason for visit:  Personal history of colon polyps ? ?History of present illness:  [name] presents to the [place] clinic at the request of his PCP for chief complaint of high-risk colon cancer surveillance 2/2 personal hx of adenomatous colon polyps. Last colonoscopy performed by Dr. [name] 08/2017 removed eight subcentimeter colon polyps with path showing tubular adenomas x4, hyperplastic polyps x2, and hamartomatous/inflammatory polyp x1. A 3-year repeat was advised. He denies any known family history of colorectal adenocarcinoma, advanced adenomas, or IBD. He denies any known family hx of juvenile polyposis syndromes. He denies any acute GI complaints today. He denies any recent changes in his bowel habits. He reports bowels are regular and without any diarrhea, constipation, fecal urgency, hematochezia, melena, or steatorrhea. He denies any abdominal pain or abdominal cramping preceding bowel movements. Appetite and diet are stable without any unintentional weight loss. He denies any UGI symptoms such as heartburn, reflux, esophageal dysphagia, odynophagia, early satiety, hoarseness, or epigastric abdominal pain. He presents to the clinic with his wife. He offers no other specific complaints today. He did just go to his 25 year old mother's funeral yesterday. ? ? ? ?Current Facility-Administered Medications:  ?  0.9 %  sodium chloride infusion, , Intravenous, Continuous, Justice, [name], MD, Last Rate: 20 mL/hr at 04/19/21 0913, Continued from Pre-op at 04/19/21 0913 ? ?Medications Prior to Admission  ?Medication Sig Dispense Refill Last Dose  ? acetaminophen (TYLENOL) 325 MG tablet Take 325 mg by mouth every 6 (six) hours as needed for mild pain.   Past Week  ? aspirin EC 81 MG tablet Take 81 mg by mouth daily.    Past Week  ? atorvastatin (LIPITOR) 40 MG tablet Take 40  mg by mouth at bedtime.    Past Week  ? gabapentin (NEURONTIN) 300 MG capsule Take 300 mg by mouth 3 (three) times daily.   Past Week  ? ibuprofen (ADVIL) 200 MG tablet Take 200 mg by mouth every 6 (six) hours as needed.   Past Week  ? lisinopril (PRINIVIL,ZESTRIL) 40 MG tablet Take 40 mg by mouth every morning.    04/19/2021 at 0530  ? Melatonin 10 MG TABS Take 10 mg by mouth at bedtime.   Past Week  ? metFORMIN (GLUCOPHAGE) 500 MG tablet Take 500-1,000 mg by mouth See admin instructions. Take 1000 mg by mouth in the morning and 500 mg at night   Past Week  ? Multiple Vitamins-Minerals (MENS 50+ MULTI VITAMIN/MIN) TABS Take 1 tablet by mouth daily.   Past Week  ? naproxen sodium (ALEVE) 220 MG tablet Take 220-440 mg by mouth daily as needed (pain).    Past Week  ? Omega-3 Fatty Acids (FISH OIL) 1000 MG CAPS Take 1,000 mg by mouth in the morning and at bedtime.   Past Week  ? sildenafil (VIAGRA) 100 MG tablet Take 100 mg by mouth at bedtime as needed for erectile dysfunction.    Past Month  ? ? ? ?No Known Allergies ? ? ?Past Medical History:  ?Diagnosis Date  ? Arthritis   ? FINGERS  ? Diabetes mellitus without complication ([place])   ? GERD (gastroesophageal reflux disease)   ? RARE  ? Hyperlipidemia   ? Hypertension   ? Internal hemorrhoids 03/22/2014  ? Obesity   ? Spherocytosis, hereditary ([place])   ? HAD SPLEEN REMOVED DUE TO THIS IN 7TH GRADE  ?  Tubular adenoma of colon 03/22/2014  ? ? ?Review of systems:  Otherwise negative.  ? ? ?Physical Exam ? ?Gen: Alert, oriented. Appears stated age.  ?HEENT: [place]/AT. PERRLA. ?Lungs: CTA, no wheezes. ?CV: RR nl S1, S2. ?Abd: soft, benign, no masses. BS+ ?Ext: No edema. Pulses 2+ ? ? ? ?Planned procedures: Proceed with colonoscopy. The patient understands the nature of the planned procedure, indications, risks, alternatives and potential complications including but not limited to bleeding, infection, perforation, damage to internal organs and possible oversedation/side effects  from anesthesia. The patient agrees and gives consent to proceed.  ?Please refer to procedure notes for findings, recommendations and patient disposition/instructions.  ? ? ? ?[name] K. Alice Reichert, M.D. ?Gastroenterology ?04/19/2021  9:26 AM ? ? ? ? ? ? ?

## 2021-04-19 NOTE — Anesthesia Preprocedure Evaluation (Signed)
Anesthesia Evaluation  ?Patient identified by MRN, date of birth, ID band ?Patient awake ? ? ? ?Reviewed: ?Allergy & Precautions, NPO status , Patient's Chart, lab work & pertinent test results ? ?History of Anesthesia Complications ?Negative for: history of anesthetic complications ? ?Airway ?Mallampati: II ? ?TM Distance: >3 FB ?Neck ROM: Full ? ? ? Dental ?no notable dental hx. ?(+) Teeth Intact ?  ?Pulmonary ?neg pulmonary ROS, neg sleep apnea, neg COPD, Patient abstained from smoking.Not current smoker, former smoker,  ?  ?Pulmonary exam normal ?breath sounds clear to auscultation ? ? ? ? ? ? Cardiovascular ?Exercise Tolerance: Good ?METShypertension, Pt. on medications ?(-) CAD, (-) Past MI and (-) CHF (-) dysrhythmias (-) Valvular Problems/Murmurs ?Rhythm:Regular Rate:Normal ?- Systolic murmurs ? ?  ?Neuro/Psych ?neg Seizures negative neurological ROS ? negative psych ROS  ? GI/Hepatic ?Neg liver ROS, PUD, GERD  Medicated,  ?Endo/Other  ?diabetes, Type 2, Oral Hypoglycemic Agents ? Renal/GU ?negative Renal ROS  ? ?  ?Musculoskeletal ? ? Abdominal ?  ?Peds ? Hematology ?  ?Anesthesia Other Findings ?Past Medical History: ?No date: Arthritis ?    Comment:  FINGERS ?No date: Diabetes mellitus without complication ([place]) ?No date: GERD (gastroesophageal reflux disease) ?    Comment:  RARE ?No date: Hyperlipidemia ?No date: Hypertension ?03/22/2014: Internal hemorrhoids ?No date: Obesity ?No date: Spherocytosis, hereditary ([place]) ?    Comment:  HAD SPLEEN REMOVED DUE TO THIS IN 7TH GRADE ?03/22/2014: Tubular adenoma of colon ? Reproductive/Obstetrics ? ?  ? ? ? ? ? ? ? ? ? ? ? ? ? ?  ?  ? ? ? ? ? ? ? ? ?Anesthesia Physical ? ?Anesthesia Plan ? ?ASA: III ? ?Anesthesia Plan: General  ? ?Post-op Pain Management: Minimal or no pain anticipated  ? ?Induction: Intravenous ? ?PONV Risk Score and Plan: 2 and Ondansetron and Propofol infusion ? ?Airway Management Planned: Natural  Airway ? ?Additional Equipment: None ? ?Intra-op Plan:  ? ?Post-operative Plan:  ? ?Informed Consent: I have reviewed the patients History and Physical, chart, labs and discussed the procedure including the risks, benefits and alternatives for the proposed anesthesia with the patient or authorized representative who has indicated his/her understanding and acceptance.  ? ? ? ?Dental advisory given ? ?Plan Discussed with: CRNA and Surgeon ? ?Anesthesia Plan Comments: (Discussed risks of anesthesia with patient, including possibility of difficulty with spontaneous ventilation under anesthesia necessitating airway intervention, PONV, and rare risks such as cardiac or respiratory or neurological events, and allergic reactions. Discussed the role of CRNA in patient's perioperative care. Patient understands.)  ? ? ? ? ? ? ?Anesthesia Quick Evaluation ? ?

## 2021-04-19 NOTE — Anesthesia Procedure Notes (Signed)
Procedure Name: General with mask airway ?Date/Time: 04/19/2021 10:28 AM ?Performed by: Kelton Pillar, CRNA ?Pre-anesthesia Checklist: Patient identified, Emergency Drugs available, Suction available and Patient being monitored ?Patient Re-evaluated:Patient Re-evaluated prior to induction ?Oxygen Delivery Method: Simple face mask ?Induction Type: IV induction ?Placement Confirmation: positive ETCO2 and CO2 detector ?Dental Injury: Teeth and Oropharynx as per pre-operative assessment  ? ? ? ? ?

## 2021-04-19 NOTE — Interval H&P Note (Signed)
History and Physical Interval Note: ? ?04/19/2021 ?9:27 AM ? ?Brent [name].  has presented today for surgery, with the diagnosis of Z86.010  - Hx of adenomatous colonic polyps.  The various methods of treatment have been discussed with the patient and family. After consideration of risks, benefits and other options for treatment, the patient has consented to  Procedure(s) with comments: ?COLONOSCOPY WITH PROPOFOL (N/A) - DM as a surgical intervention.  The patient's history has been reviewed, patient examined, no change in status, stable for surgery.  I have reviewed the patient's chart and labs.  Questions were answered to the patient's satisfaction.   ? ? ?[name], Brent ? ? ?

## 2021-04-19 NOTE — Transfer of Care (Signed)
Immediate Anesthesia Transfer of Care Note ? ?Patient: Brent [name]. ? ?Procedure(s) Performed: COLONOSCOPY WITH PROPOFOL ? ?Patient Location: Endoscopy Unit ? ?Anesthesia Type: General ? ?Level of Consciousness: drowsy and patient cooperative ? ?Airway & Oxygen Therapy: Patient Spontanous Breathing and Patient connected to face mask oxygen ? ?Post-op Assessment: Report given to RN and Post -op Vital signs reviewed and stable ? ?Post vital signs: Reviewed and stable ? ?Last Vitals:  ?Vitals Value Taken Time  ?BP 124/71 04/19/21 1025  ?Temp    ?Pulse 70 04/19/21 1025  ?Resp 11 04/19/21 1025  ?SpO2 97 % 04/19/21 1025  ? ? ?Last Pain:  ?Vitals:  ? 04/19/21 1025  ?TempSrc:   ?PainSc: Asleep  ?   ? ?  ? ?Complications: No notable events documented. ?

## 2021-04-20 LAB — SURGICAL PATHOLOGY

## 2021-04-21 ENCOUNTER — Encounter: Payer: Self-pay | Admitting: Internal Medicine

## 2021-05-01 ENCOUNTER — Other Ambulatory Visit: Payer: Self-pay | Admitting: Neurosurgery

## 2021-05-01 DIAGNOSIS — Z01818 Encounter for other preprocedural examination: Secondary | ICD-10-CM

## 2021-05-15 ENCOUNTER — Encounter
Admission: RE | Admit: 2021-05-15 | Discharge: 2021-05-15 | Disposition: A | Discharge status: Home / Self Care | Payer: Medicare Other | Source: Ambulatory Visit | Attending: Neurosurgery | Admitting: Neurosurgery

## 2021-05-15 ENCOUNTER — Other Ambulatory Visit: Payer: Self-pay

## 2021-05-15 VITALS — BP 159/70 | HR 65 | Temp 98.1°F | Resp 18 | Ht 68.0 in | Wt 241.7 lb

## 2021-05-15 DIAGNOSIS — E119 Type 2 diabetes mellitus without complications: Secondary | ICD-10-CM | POA: Insufficient documentation

## 2021-05-15 DIAGNOSIS — Z01818 Encounter for other preprocedural examination: Secondary | ICD-10-CM | POA: Diagnosis present

## 2021-05-15 DIAGNOSIS — D649 Anemia, unspecified: Secondary | ICD-10-CM | POA: Insufficient documentation

## 2021-05-15 LAB — URINALYSIS, ROUTINE W REFLEX MICROSCOPIC
Bilirubin Urine: NEGATIVE
Glucose, UA: NEGATIVE mg/dL
Hgb urine dipstick: NEGATIVE
Ketones, ur: NEGATIVE mg/dL
Leukocytes,Ua: NEGATIVE
Nitrite: NEGATIVE
Protein, ur: 100 mg/dL — AB
Specific Gravity, Urine: 1.02 (ref 1.005–1.030)
pH: 6 (ref 5.0–8.0)

## 2021-05-15 LAB — BASIC METABOLIC PANEL
Anion gap: 9 (ref 5–15)
BUN: 18 mg/dL (ref 8–23)
CO2: 28 mmol/L (ref 22–32)
Calcium: 9.2 mg/dL (ref 8.9–10.3)
Chloride: 104 mmol/L (ref 98–111)
Creatinine, Ser: 0.92 mg/dL (ref 0.61–1.24)
GFR, Estimated: >60 mL/min (ref >60)
Glucose, Bld: 156 mg/dL — ABNORMAL HIGH (ref 70–99)
Potassium: 4.2 mmol/L (ref 3.5–5.1)
Sodium: 141 mmol/L (ref 135–145)

## 2021-05-15 LAB — CBC
HCT: 45.1 % (ref 39.0–52.0)
Hemoglobin: 15.4 g/dL (ref 13.0–17.0)
MCH: 30.4 pg (ref 26.0–34.0)
MCHC: 34.1 g/dL (ref 30.0–36.0)
MCV: 89.1 fL (ref 80.0–100.0)
Platelets: 442 10*3/uL — ABNORMAL HIGH (ref 150–400)
RBC: 5.06 MIL/uL (ref 4.22–5.81)
RDW: 13.5 % (ref 11.5–15.5)
WBC: 16.7 10*3/uL — ABNORMAL HIGH (ref 4.0–10.5)
nRBC: 0 % (ref 0.0–0.2)

## 2021-05-15 LAB — SURGICAL PCR SCREEN
MRSA, PCR: NEGATIVE
Staphylococcus aureus: NEGATIVE

## 2021-05-15 NOTE — Patient Instructions (Addendum)
Your procedure is scheduled on: Wednesday 05/24/21 ?Report to the Registration Desk on the 1st floor of the [facility]. ?To find out your arrival time, please call [phone] between 1PM - 3PM on: Tuesday 05/23/21 ? ?REMEMBER: ?Instructions that are not followed completely may result in serious medical risk, up to and including death; or upon the discretion of your surgeon and anesthesiologist your surgery may need to be rescheduled. ? ?Do not eat food after midnight the night before surgery.  ?No gum chewing, lozengers or hard candies. ? ?You may however, drink water up to 2 hours before you are scheduled to arrive for your surgery. Do not drink anything within 2 hours of your scheduled arrival time. ? ?Type 1 and Type 2 diabetics should only drink water. ? ?TAKE THESE MEDICATIONS THE MORNING OF SURGERY WITH A SIP OF WATER: ?NONE ? ?Hold your aspirin EC 81 MG tablet for 7 days prior to surgery and 14 days after surgery. ? ?Metformin hold 2 days prior to surgery. Last dose the 05/21/21 Sunday. ? ?One week prior to surgery: ?Stop Anti-inflammatories (NSAIDS) such as Advil, Aleve, Ibuprofen, Motrin, Naproxen, Naprosyn and Aspirin based products such as Excedrin, Goodys Powder, BC Powder. ? ?Stop taking your Cinnamon Multiple Vitamin (MULTIVITAMIN WITH MINERALS) TABS tablet, Multiple Vitamins-Minerals (MENS 50+ MULTI VITAMIN/MIN) TABS,Omega-3 Fatty Acids (FISH OIL) 1000 MG CAPS and  ANY other OVER THE COUNTER supplements until after surgery. ? ?You may however, continue to take Tylenol if needed for pain up until the day of surgery. ? ?No Alcohol for 24 hours before or after surgery. ? ?No Smoking including e-cigarettes for 24 hours prior to surgery.  ?No chewable tobacco products for at least 6 hours prior to surgery.  ?No nicotine patches on the day of surgery. ? ?Do not use any "recreational" drugs for at least a week prior to your surgery.  ?Please be advised that the combination of cocaine and anesthesia may  have negative outcomes, up to and including death. ?If you test positive for cocaine, your surgery will be cancelled. ? ?On the morning of surgery brush your teeth with toothpaste and water, you may rinse your mouth with mouthwash if you wish. ?Do not swallow any toothpaste or mouthwash. ? ?Use CHG Soap as directed on instruction sheet. ? ?Do not wear jewelry. ? ?Do not wear lotions, powders, or colognes.  ? ?Do not shave body from the neck down 48 hours prior to surgery just in case you cut yourself which could leave a site for infection.  ?Also, freshly shaved skin may become irritated if using the CHG soap. ? ?Do not bring valuables to the hospital. [facility] is not responsible for any missing/lost belongings or valuables.  ? ?Notify your doctor if there is any change in your medical condition (cold, fever, infection). ? ?Wear comfortable clothing (specific to your surgery type) to the hospital. ? ?If you are being admitted to the hospital overnight, leave your suitcase in the car. ?After surgery it may be brought to your room. ? ?If you are taking public transportation, you will need to have a responsible adult (18 years or older) with you. ?Please confirm with your physician that it is acceptable to use public transportation.  ? ?Please call the [facility] at [phone] if you have any questions about these instructions. ? ?Surgery Visitation Policy: ? ?Patients undergoing a surgery or procedure may have two family members or support persons with them as long as the person is not  COVID-19 positive or experiencing its symptoms.  ? ?Inpatient Visitation:   ? ?Visiting hours are 7 a.m. to 8 p.m. ?Up to four visitors are allowed at one time in a patient room, including children. The visitors may rotate out with other people during the day. One designated support person (adult) may remain overnight.  ?

## 2021-05-24 ENCOUNTER — Inpatient Hospital Stay: Payer: Medicare Other

## 2021-05-24 ENCOUNTER — Encounter: Payer: Self-pay | Admitting: Neurosurgery

## 2021-05-24 ENCOUNTER — Inpatient Hospital Stay: Payer: Medicare Other | Admitting: Certified Registered"

## 2021-05-24 ENCOUNTER — Inpatient Hospital Stay
Admission: RE | Admit: 2021-05-24 | Discharge: 2021-05-26 | DRG: 455 | Disposition: A | Discharge status: Home Health | Payer: Medicare Other | Attending: Neurosurgery | Admitting: Neurosurgery

## 2021-05-24 ENCOUNTER — Other Ambulatory Visit: Payer: Self-pay

## 2021-05-24 ENCOUNTER — Encounter
Admission: RE | Disposition: A | Discharge status: Home Health | Payer: Self-pay | Source: Home / Self Care | Attending: Neurosurgery

## 2021-05-24 DIAGNOSIS — E119 Type 2 diabetes mellitus without complications: Secondary | ICD-10-CM | POA: Diagnosis present

## 2021-05-24 DIAGNOSIS — Z8371 Family history of colonic polyps: Secondary | ICD-10-CM

## 2021-05-24 DIAGNOSIS — M48061 Spinal stenosis, lumbar region without neurogenic claudication: Secondary | ICD-10-CM | POA: Diagnosis present

## 2021-05-24 DIAGNOSIS — Z833 Family history of diabetes mellitus: Secondary | ICD-10-CM | POA: Diagnosis not present

## 2021-05-24 DIAGNOSIS — Z86010 Personal history of colonic polyps: Secondary | ICD-10-CM

## 2021-05-24 DIAGNOSIS — Z87891 Personal history of nicotine dependence: Secondary | ICD-10-CM | POA: Diagnosis not present

## 2021-05-24 DIAGNOSIS — M1712 Unilateral primary osteoarthritis, left knee: Secondary | ICD-10-CM | POA: Diagnosis present

## 2021-05-24 DIAGNOSIS — M4317 Spondylolisthesis, lumbosacral region: Secondary | ICD-10-CM

## 2021-05-24 DIAGNOSIS — Z7984 Long term (current) use of oral hypoglycemic drugs: Secondary | ICD-10-CM

## 2021-05-24 DIAGNOSIS — Z79899 Other long term (current) drug therapy: Secondary | ICD-10-CM

## 2021-05-24 DIAGNOSIS — G629 Polyneuropathy, unspecified: Secondary | ICD-10-CM | POA: Diagnosis present

## 2021-05-24 DIAGNOSIS — E785 Hyperlipidemia, unspecified: Secondary | ICD-10-CM | POA: Diagnosis present

## 2021-05-24 DIAGNOSIS — Z9049 Acquired absence of other specified parts of digestive tract: Secondary | ICD-10-CM | POA: Diagnosis not present

## 2021-05-24 DIAGNOSIS — Z9081 Acquired absence of spleen: Secondary | ICD-10-CM | POA: Diagnosis not present

## 2021-05-24 DIAGNOSIS — Z981 Arthrodesis status: Principal | ICD-10-CM

## 2021-05-24 HISTORY — PX: ABDOMINAL EXPOSURE: SHX5708

## 2021-05-24 HISTORY — PX: APPLICATION OF INTRAOPERATIVE CT SCAN: SHX6668

## 2021-05-24 HISTORY — PX: ANTERIOR AND POSTERIOR SPINAL FUSION: SHX2259

## 2021-05-24 LAB — GLUCOSE, CAPILLARY
Glucose-Capillary: 190 mg/dL — ABNORMAL HIGH (ref 70–99)
Glucose-Capillary: 205 mg/dL — ABNORMAL HIGH (ref 70–99)

## 2021-05-24 SURGERY — ANTERIOR AND POSTERIOR SPINAL FUSION
Anesthesia: General | Site: Spine Lumbar

## 2021-05-24 MED ORDER — POLYVINYL ALCOHOL 1.4 % OP SOLN: 1.0000 [drp] | OPHTHALMIC | Status: DC | PRN

## 2021-05-24 MED ORDER — PHENYLEPHRINE HCL-NACL 20-0.9 MG/250ML-% IV SOLN
INTRAVENOUS | Status: DC | PRN
  Administered 2021-05-24: 20 ug/min via INTRAVENOUS

## 2021-05-24 MED ORDER — CHLORHEXIDINE GLUCONATE 0.12 % MT SOLN
OROMUCOSAL | Status: AC
  Administered 2021-05-24: 15 mL via OROMUCOSAL
  Filled 2021-05-24: qty 15

## 2021-05-24 MED ORDER — ONDANSETRON HCL 4 MG/2ML IJ SOLN: 4.0000 mg | Freq: Once | INTRAMUSCULAR | Status: DC | PRN

## 2021-05-24 MED ORDER — BUPIVACAINE-EPINEPHRINE (PF) 0.5% -1:200000 IJ SOLN
INTRAMUSCULAR | Status: AC
  Filled 2021-05-24: qty 30

## 2021-05-24 MED ORDER — BISACODYL 10 MG RE SUPP
10.0000 mg | Freq: Every day | RECTAL | Status: DC | PRN
  Administered 2021-05-26: 10 mg via RECTAL
  Filled 2021-05-24: qty 1

## 2021-05-24 MED ORDER — DOCUSATE SODIUM 100 MG PO CAPS
100.0000 mg | ORAL_CAPSULE | Freq: Two times a day (BID) | ORAL | Status: DC
  Administered 2021-05-24 – 2021-05-26 (×4): 100 mg via ORAL
  Filled 2021-05-24 (×4): qty 1

## 2021-05-24 MED ORDER — SODIUM CHLORIDE 0.9 % IV SOLN: INTRAVENOUS | Status: DC

## 2021-05-24 MED ORDER — ALBUMIN HUMAN 5 % IV SOLN
INTRAVENOUS | Status: AC
Start: 2021-05-24 — End: ?
  Filled 2021-05-24: qty 250

## 2021-05-24 MED ORDER — ATORVASTATIN CALCIUM 20 MG PO TABS
40.0000 mg | ORAL_TABLET | Freq: Every day | ORAL | Status: DC
  Administered 2021-05-24 – 2021-05-25 (×2): 40 mg via ORAL
  Filled 2021-05-24 (×2): qty 2

## 2021-05-24 MED ORDER — ONDANSETRON HCL 4 MG/2ML IJ SOLN
INTRAMUSCULAR | Status: DC | PRN
Start: 2021-05-24 — End: 2021-05-24
  Administered 2021-05-24: 4 mg via INTRAVENOUS

## 2021-05-24 MED ORDER — LIDOCAINE HCL (CARDIAC) PF 100 MG/5ML IV SOSY
PREFILLED_SYRINGE | INTRAVENOUS | Status: DC | PRN
  Administered 2021-05-24: 100 mg via INTRAVENOUS

## 2021-05-24 MED ORDER — ALBUMIN HUMAN 5 % IV SOLN: INTRAVENOUS | Status: DC | PRN

## 2021-05-24 MED ORDER — REMIFENTANIL HCL 1 MG IV SOLR
INTRAVENOUS | Status: AC
  Filled 2021-05-24: qty 1000

## 2021-05-24 MED ORDER — GLYCOPYRROLATE 0.2 MG/ML IJ SOLN
INTRAMUSCULAR | Status: AC
Start: 2021-05-24 — End: ?
  Filled 2021-05-24: qty 2

## 2021-05-24 MED ORDER — METFORMIN HCL 500 MG PO TABS
500.0000 mg | ORAL_TABLET | Freq: Every day | ORAL | Status: DC
  Administered 2021-05-25 – 2021-05-26 (×2): 500 mg via ORAL
  Filled 2021-05-24 (×2): qty 1

## 2021-05-24 MED ORDER — PROPOFOL 1000 MG/100ML IV EMUL
INTRAVENOUS | Status: AC
  Filled 2021-05-24: qty 100

## 2021-05-24 MED ORDER — DEXAMETHASONE SODIUM PHOSPHATE 10 MG/ML IJ SOLN
INTRAMUSCULAR | Status: DC | PRN
  Administered 2021-05-24: 10 mg via INTRAVENOUS

## 2021-05-24 MED ORDER — PHENYLEPHRINE HCL (PRESSORS) 10 MG/ML IV SOLN
INTRAVENOUS | Status: DC | PRN
  Administered 2021-05-24: 100 ug via INTRAVENOUS
  Administered 2021-05-24: 200 ug via INTRAVENOUS
  Administered 2021-05-24: 100 ug via INTRAVENOUS
  Administered 2021-05-24: 50 ug via INTRAVENOUS
  Administered 2021-05-24: 200 ug via INTRAVENOUS

## 2021-05-24 MED ORDER — CEFAZOLIN SODIUM-DEXTROSE 2-4 GM/100ML-% IV SOLN
INTRAVENOUS | Status: AC
  Filled 2021-05-24: qty 100

## 2021-05-24 MED ORDER — ENOXAPARIN SODIUM 40 MG/0.4ML IJ SOSY
40.0000 mg | PREFILLED_SYRINGE | INTRAMUSCULAR | Status: DC
  Administered 2021-05-25 – 2021-05-26 (×2): 40 mg via SUBCUTANEOUS
  Filled 2021-05-24 (×2): qty 0.4

## 2021-05-24 MED ORDER — OXYCODONE HCL 5 MG PO TABS
10.0000 mg | ORAL_TABLET | ORAL | Status: DC | PRN
  Administered 2021-05-25 – 2021-05-26 (×6): 10 mg via ORAL
  Filled 2021-05-24 (×6): qty 2

## 2021-05-24 MED ORDER — METHOCARBAMOL 1000 MG/10ML IJ SOLN
500.0000 mg | Freq: Four times a day (QID) | INTRAVENOUS | Status: DC
  Filled 2021-05-24: qty 5

## 2021-05-24 MED ORDER — SODIUM CHLORIDE (PF) 0.9 % IJ SOLN: INTRAMUSCULAR | Status: DC | PRN

## 2021-05-24 MED ORDER — ACETAMINOPHEN 10 MG/ML IV SOLN
INTRAVENOUS | Status: AC
Start: 2021-05-24 — End: ?
  Filled 2021-05-24: qty 100

## 2021-05-24 MED ORDER — BUPIVACAINE HCL (PF) 0.5 % IJ SOLN
INTRAMUSCULAR | Status: AC
  Filled 2021-05-24: qty 30

## 2021-05-24 MED ORDER — FENTANYL CITRATE (PF) 100 MCG/2ML IJ SOLN
25.0000 ug | INTRAMUSCULAR | Status: DC | PRN
  Administered 2021-05-24: 50 ug via INTRAVENOUS

## 2021-05-24 MED ORDER — OXYCODONE HCL 5 MG PO TABS
5.0000 mg | ORAL_TABLET | ORAL | Status: DC | PRN
  Administered 2021-05-24 – 2021-05-26 (×2): 5 mg via ORAL
  Filled 2021-05-24 (×2): qty 1

## 2021-05-24 MED ORDER — SODIUM CHLORIDE 0.9% FLUSH
3.0000 mL | Freq: Two times a day (BID) | INTRAVENOUS | Status: DC
  Administered 2021-05-24 – 2021-05-26 (×4): 3 mL via INTRAVENOUS

## 2021-05-24 MED ORDER — PHENOL 1.4 % MT LIQD: 1.0000 | OROMUCOSAL | Status: DC | PRN

## 2021-05-24 MED ORDER — LIDOCAINE HCL (PF) 2 % IJ SOLN
INTRAMUSCULAR | Status: AC
  Filled 2021-05-24: qty 5

## 2021-05-24 MED ORDER — FENTANYL CITRATE (PF) 100 MCG/2ML IJ SOLN
INTRAMUSCULAR | Status: DC | PRN
  Administered 2021-05-24: 100 ug via INTRAVENOUS

## 2021-05-24 MED ORDER — REMIFENTANIL HCL 1 MG IV SOLR
INTRAVENOUS | Status: DC | PRN
  Administered 2021-05-24: .05 ug/kg/min via INTRAVENOUS

## 2021-05-24 MED ORDER — FLEET ENEMA 7-19 GM/118ML RE ENEM
1.0000 | ENEMA | Freq: Once | RECTAL | Status: AC | PRN
  Administered 2021-05-26: 1 via RECTAL

## 2021-05-24 MED ORDER — ROCURONIUM BROMIDE 100 MG/10ML IV SOLN
INTRAVENOUS | Status: DC | PRN
  Administered 2021-05-24 (×2): 10 mg via INTRAVENOUS
  Administered 2021-05-24: 20 mg via INTRAVENOUS
  Administered 2021-05-24: 10 mg via INTRAVENOUS
  Administered 2021-05-24: 50 mg via INTRAVENOUS

## 2021-05-24 MED ORDER — PROPOFOL 500 MG/50ML IV EMUL
INTRAVENOUS | Status: DC | PRN
Start: 2021-05-24 — End: 2021-05-24
  Administered 2021-05-24: 150 ug/kg/min via INTRAVENOUS

## 2021-05-24 MED ORDER — PHENYLEPHRINE HCL (PRESSORS) 10 MG/ML IV SOLN
INTRAVENOUS | Status: AC
  Filled 2021-05-24: qty 1

## 2021-05-24 MED ORDER — METHOCARBAMOL 500 MG PO TABS
500.0000 mg | ORAL_TABLET | Freq: Four times a day (QID) | ORAL | Status: DC
  Administered 2021-05-24 – 2021-05-26 (×9): 500 mg via ORAL
  Filled 2021-05-24 (×9): qty 1

## 2021-05-24 MED ORDER — MENTHOL 3 MG MT LOZG: 1.0000 | LOZENGE | OROMUCOSAL | Status: DC | PRN

## 2021-05-24 MED ORDER — KETOROLAC TROMETHAMINE 15 MG/ML IJ SOLN
7.5000 mg | Freq: Four times a day (QID) | INTRAMUSCULAR | Status: AC
  Administered 2021-05-24 – 2021-05-25 (×3): 7.5 mg via INTRAVENOUS
  Filled 2021-05-24 (×2): qty 1

## 2021-05-24 MED ORDER — SODIUM CHLORIDE 0.9% FLUSH: 3.0000 mL | INTRAVENOUS | Status: DC | PRN

## 2021-05-24 MED ORDER — ACETAMINOPHEN 500 MG PO TABS
1000.0000 mg | ORAL_TABLET | Freq: Four times a day (QID) | ORAL | Status: AC
  Administered 2021-05-24 – 2021-05-25 (×4): 1000 mg via ORAL
  Filled 2021-05-24 (×4): qty 2

## 2021-05-24 MED ORDER — FAMOTIDINE 20 MG PO TABS
ORAL_TABLET | ORAL | Status: AC
  Administered 2021-05-24: 20 mg via ORAL
  Filled 2021-05-24: qty 1

## 2021-05-24 MED ORDER — SUGAMMADEX SODIUM 200 MG/2ML IV SOLN
INTRAVENOUS | Status: DC | PRN
Start: 2021-05-24 — End: 2021-05-24
  Administered 2021-05-24: 200 mg via INTRAVENOUS

## 2021-05-24 MED ORDER — VANCOMYCIN HCL 1500 MG/300ML IV SOLN
1500.0000 mg | INTRAVENOUS | Status: AC
  Administered 2021-05-24: 1500 mg via INTRAVENOUS
  Filled 2021-05-24: qty 300

## 2021-05-24 MED ORDER — SENNA 8.6 MG PO TABS
1.0000 | ORAL_TABLET | Freq: Two times a day (BID) | ORAL | Status: DC
  Administered 2021-05-24 – 2021-05-25 (×3): 8.6 mg via ORAL
  Filled 2021-05-24 (×4): qty 1

## 2021-05-24 MED ORDER — SODIUM CHLORIDE FLUSH 0.9 % IV SOLN
INTRAVENOUS | Status: AC
  Filled 2021-05-24: qty 20

## 2021-05-24 MED ORDER — HYDROMORPHONE HCL 1 MG/ML IJ SOLN
0.2500 mg | INTRAMUSCULAR | Status: DC | PRN
  Administered 2021-05-24: 0.5 mg via INTRAVENOUS

## 2021-05-24 MED ORDER — FENTANYL CITRATE (PF) 100 MCG/2ML IJ SOLN
INTRAMUSCULAR | Status: AC
  Administered 2021-05-24: 50 ug via INTRAVENOUS
  Filled 2021-05-24: qty 2

## 2021-05-24 MED ORDER — HYDROMORPHONE HCL 1 MG/ML IJ SOLN: 1.0000 mg | INTRAMUSCULAR | Status: DC | PRN

## 2021-05-24 MED ORDER — NEOSTIGMINE METHYLSULFATE 10 MG/10ML IV SOLN
INTRAVENOUS | Status: AC
  Filled 2021-05-24: qty 1

## 2021-05-24 MED ORDER — LISINOPRIL 20 MG PO TABS
40.0000 mg | ORAL_TABLET | ORAL | Status: DC
  Administered 2021-05-25 – 2021-05-26 (×2): 40 mg via ORAL
  Filled 2021-05-24 (×2): qty 2

## 2021-05-24 MED ORDER — ROCURONIUM BROMIDE 10 MG/ML (PF) SYRINGE
PREFILLED_SYRINGE | INTRAVENOUS | Status: AC
  Filled 2021-05-24: qty 10

## 2021-05-24 MED ORDER — CHLORHEXIDINE GLUCONATE 0.12 % MT SOLN: 15.0000 mL | Freq: Once | OROMUCOSAL | Status: AC

## 2021-05-24 MED ORDER — BUPIVACAINE LIPOSOME 1.3 % IJ SUSP
INTRAMUSCULAR | Status: AC
  Filled 2021-05-24: qty 20

## 2021-05-24 MED ORDER — ORAL CARE MOUTH RINSE: 15.0000 mL | Freq: Once | OROMUCOSAL | Status: AC

## 2021-05-24 MED ORDER — 0.9 % SODIUM CHLORIDE (POUR BTL) OPTIME
TOPICAL | Status: DC | PRN
  Administered 2021-05-24: 1000 mL

## 2021-05-24 MED ORDER — PROPOFOL 10 MG/ML IV BOLUS
INTRAVENOUS | Status: DC | PRN
Start: 2021-05-24 — End: 2021-05-24
  Administered 2021-05-24: 150 mg via INTRAVENOUS

## 2021-05-24 MED ORDER — GLYCOPYRROLATE 0.2 MG/ML IJ SOLN
INTRAMUSCULAR | Status: DC | PRN
  Administered 2021-05-24: .2 mg via INTRAVENOUS

## 2021-05-24 MED ORDER — METFORMIN HCL 500 MG PO TABS
1000.0000 mg | ORAL_TABLET | Freq: Every day | ORAL | Status: DC
Start: 2021-05-25 — End: 2021-05-26
  Administered 2021-05-25 – 2021-05-26 (×2): 1000 mg via ORAL
  Filled 2021-05-24 (×2): qty 2

## 2021-05-24 MED ORDER — GABAPENTIN 300 MG PO CAPS
300.0000 mg | ORAL_CAPSULE | Freq: Every day | ORAL | Status: DC
Start: 2021-05-24 — End: 2021-05-26
  Filled 2021-05-24 (×3): qty 1

## 2021-05-24 MED ORDER — FAMOTIDINE 20 MG PO TABS: 20.0000 mg | ORAL_TABLET | Freq: Once | ORAL | Status: AC

## 2021-05-24 MED ORDER — ONDANSETRON HCL 4 MG/2ML IJ SOLN: 4.0000 mg | Freq: Four times a day (QID) | INTRAMUSCULAR | Status: DC | PRN

## 2021-05-24 MED ORDER — CEFAZOLIN SODIUM-DEXTROSE 2-4 GM/100ML-% IV SOLN
2.0000 g | INTRAVENOUS | Status: AC
  Administered 2021-05-24 (×2): 2 g via INTRAVENOUS

## 2021-05-24 MED ORDER — ONDANSETRON HCL 4 MG PO TABS: 4.0000 mg | ORAL_TABLET | Freq: Four times a day (QID) | ORAL | Status: DC | PRN

## 2021-05-24 MED ORDER — NEOSTIGMINE METHYLSULFATE 10 MG/10ML IV SOLN
INTRAVENOUS | Status: DC | PRN
  Administered 2021-05-24: 3 mg via INTRAVENOUS

## 2021-05-24 MED ORDER — HYDROMORPHONE HCL 1 MG/ML IJ SOLN
INTRAMUSCULAR | Status: AC
  Administered 2021-05-24: 0.5 mg via INTRAVENOUS
  Filled 2021-05-24: qty 1

## 2021-05-24 MED ORDER — FENTANYL CITRATE (PF) 100 MCG/2ML IJ SOLN
INTRAMUSCULAR | Status: AC
  Filled 2021-05-24: qty 2

## 2021-05-24 MED ORDER — SODIUM CHLORIDE 0.9 % IV SOLN: INTRAVENOUS | Status: DC | PRN

## 2021-05-24 MED ORDER — EPHEDRINE SULFATE (PRESSORS) 50 MG/ML IJ SOLN
INTRAMUSCULAR | Status: DC | PRN
  Administered 2021-05-24: 5 mg via INTRAVENOUS
  Administered 2021-05-24 (×2): 10 mg via INTRAVENOUS

## 2021-05-24 MED ORDER — SODIUM CHLORIDE 0.9 % IV SOLN: 250.0000 mL | INTRAVENOUS | Status: DC

## 2021-05-24 MED ORDER — BUPIVACAINE-EPINEPHRINE (PF) 0.5% -1:200000 IJ SOLN
INTRAMUSCULAR | Status: DC | PRN
Start: 2021-05-24 — End: 2021-05-24
  Administered 2021-05-24 (×2): 10 mL

## 2021-05-24 MED ORDER — SENNOSIDES-DOCUSATE SODIUM 8.6-50 MG PO TABS
1.0000 | ORAL_TABLET | Freq: Every evening | ORAL | Status: DC | PRN
  Administered 2021-05-26: 1 via ORAL
  Filled 2021-05-24: qty 1

## 2021-05-24 MED ORDER — ACETAMINOPHEN 10 MG/ML IV SOLN
INTRAVENOUS | Status: DC | PRN
  Administered 2021-05-24: 1000 mg via INTRAVENOUS

## 2021-05-24 MED ORDER — EPHEDRINE 5 MG/ML INJ
INTRAVENOUS | Status: AC
  Filled 2021-05-24: qty 5

## 2021-05-24 MED ORDER — KETOROLAC TROMETHAMINE 15 MG/ML IJ SOLN
INTRAMUSCULAR | Status: AC
  Filled 2021-05-24: qty 1

## 2021-05-24 SURGICAL SUPPLY — 114 items
ADH SKN CLS APL DERMABOND .7 (GAUZE/BANDAGES/DRESSINGS) ×6
AGENT HMST KT MTR STRL THRMB (HEMOSTASIS) ×3
ALLOGRAFT BONE FIBER KORE 5 (Bone Implant) ×1 IMPLANT
APL PRP STRL LF DISP 70% ISPRP (MISCELLANEOUS) ×12
APPLIER CLIP 11 MED OPEN (CLIP)
APR CLP MED 11 20 MLT OPN (CLIP)
BUR NEURO DRILL SOFT 3.0X3.8M (BURR) IMPLANT
CAP LOCKING SPINE (Cap) ×4 IMPLANT
CHLORAPREP W/TINT 26 (MISCELLANEOUS) ×16 IMPLANT
CLIP APPLIE 11 MED OPEN (CLIP) IMPLANT
CORD BIP STRL DISP 12FT (MISCELLANEOUS) ×4 IMPLANT
COUNTER NEEDLE 20/40 LG (NEEDLE) ×8 IMPLANT
COVER BACK TABLE REUSABLE LG (DRAPES) ×4 IMPLANT
CUP MEDICINE 2OZ PLAST GRAD ST (MISCELLANEOUS) ×4 IMPLANT
DERMABOND ADVANCED (GAUZE/BANDAGES/DRESSINGS) ×2
DERMABOND ADVANCED .7 DNX12 (GAUZE/BANDAGES/DRESSINGS) ×9 IMPLANT
DRAPE 3D C-ARM OEC (DRAPES) ×2 IMPLANT
DRAPE C-ARMOR (DRAPES) IMPLANT
DRAPE INCISE IOBAN 66X60 STRL (DRAPES) ×4 IMPLANT
DRAPE LAPAROTOMY 100X77 ABD (DRAPES) ×4 IMPLANT
DRAPE SCAN PATIENT (DRAPES) ×5 IMPLANT
DRAPE SURG 17X11 SM STRL (DRAPES) ×8 IMPLANT
DRESSING SURGICEL FIBRLLR 1X2 (HEMOSTASIS) IMPLANT
DRSG OPSITE POSTOP 4X6 (GAUZE/BANDAGES/DRESSINGS) ×1 IMPLANT
DRSG OPSITE POSTOP 4X8 (GAUZE/BANDAGES/DRESSINGS) ×1 IMPLANT
DRSG SURGICEL FIBRILLAR 1X2 (HEMOSTASIS)
ELECT CAUTERY BLADE 6.4 (BLADE) ×1 IMPLANT
ELECT CAUTERY BLADE TIP 2.5 (TIP) ×4
ELECT EZSTD 165MM 6.5IN (MISCELLANEOUS) ×4
ELECT REM PT RETURN 9FT ADLT (ELECTROSURGICAL) ×8
ELECTRODE CAUTERY BLDE TIP 2.5 (TIP) ×3 IMPLANT
ELECTRODE EZSTD 165MM 6.5IN (MISCELLANEOUS) ×3 IMPLANT
ELECTRODE REM PT RTRN 9FT ADLT (ELECTROSURGICAL) ×6 IMPLANT
EX-PIN ORTHOLOCK NAV 4X150 (PIN) ×1 IMPLANT
FEE INTRAOP CADWELL SUPPLY NCS (MISCELLANEOUS) IMPLANT
FORCEPS BPLR BAYO 10IN 1.0TIP (ORTHOPEDIC DISPOSABLE SUPPLIES) ×1 IMPLANT
GAUZE 4X4 16PLY ~~LOC~~+RFID DBL (SPONGE) ×4 IMPLANT
GLOVE BIOGEL PI IND STRL 6.5 (GLOVE) ×6 IMPLANT
GLOVE BIOGEL PI INDICATOR 6.5 (GLOVE) ×2
GLOVE SURG SYN 6.5 ES PF (GLOVE) ×8 IMPLANT
GLOVE SURG SYN 6.5 PF PI (GLOVE) ×6 IMPLANT
GLOVE SURG SYN 8.5  E (GLOVE) ×6
GLOVE SURG SYN 8.5 E (GLOVE) ×18 IMPLANT
GLOVE SURG SYN 8.5 PF PI (GLOVE) ×18 IMPLANT
GOWN SRG LRG LVL 4 IMPRV REINF (GOWNS) ×6 IMPLANT
GOWN SRG XL LVL 3 NONREINFORCE (GOWNS) ×12 IMPLANT
GOWN STRL NON-REIN TWL XL LVL3 (GOWNS) ×16
GOWN STRL REIN LRG LVL4 (GOWNS) ×8
GOWN STRL REUS W/ TWL XL LVL3 (GOWN DISPOSABLE) ×12 IMPLANT
GOWN STRL REUS W/TWL XL LVL3 (GOWN DISPOSABLE) ×16
GRADUATE 1200CC STRL 31836 (MISCELLANEOUS) ×4 IMPLANT
INTRAOP CADWELL SUPPLY FEE NCS (MISCELLANEOUS)
INTRAOP DISP SUPPLY FEE NCS (MISCELLANEOUS)
K-WIRE 1.6 NITINOL SHARP TIP (WIRE) ×16
KIT DISP MARS 3V (KITS) ×1 IMPLANT
KIT SPINAL PRONEVIEW (KITS) ×4 IMPLANT
KIT TURNOVER KIT A (KITS) ×4 IMPLANT
KWIRE 1.6 NITINOL SHARP TIP (WIRE) IMPLANT
LOOP RED MAXI  1X406MM (MISCELLANEOUS) ×2
LOOP VESSEL MAXI 1X406 RED (MISCELLANEOUS) ×3 IMPLANT
LOOP VESSEL MINI 0.8X406 BLUE (MISCELLANEOUS) ×6 IMPLANT
LOOPS BLUE MINI 0.8X406MM (MISCELLANEOUS) ×1
MANIFOLD NEPTUNE II (INSTRUMENTS) ×8 IMPLANT
MARKER SKIN DUAL TIP RULER LAB (MISCELLANEOUS) ×12 IMPLANT
MARKER SPHERE PSV REFLC 13MM (MARKER) ×31 IMPLANT
NDL SAFETY ECLIPSE 18X1.5 (NEEDLE) ×3 IMPLANT
NEEDLE HYPO 18GX1.5 SHARP (NEEDLE) ×4
NEEDLE HYPO 22GX1.5 SAFETY (NEEDLE) ×8 IMPLANT
NS IRRIG 1000ML POUR BTL (IV SOLUTION) ×1 IMPLANT
PACK LAMINECTOMY NEURO (CUSTOM PROCEDURE TRAY) ×4 IMPLANT
PACK UNIVERSAL (MISCELLANEOUS) ×4 IMPLANT
PAD ARMBOARD 7.5X6 YLW CONV (MISCELLANEOUS) ×4 IMPLANT
PENCIL ELECTRO HAND CTR (MISCELLANEOUS) ×4 IMPLANT
PLEDGET CV PTFE 7X3 (MISCELLANEOUS) IMPLANT
RETRACTOR TRAXI PANNICULUS (MISCELLANEOUS) IMPLANT
ROD SPINE CURVE 5.5X50 (Rod) ×2 IMPLANT
SCREW CREO 6.5X45 FEN (Screw) ×2 IMPLANT
SCREW CREO FENESTRATED 7.5X35 (Screw) ×1 IMPLANT
SCREW CREO FENESTRATED 7.5X45 (Screw) ×1 IMPLANT
SCREW CREO MIS 30 TULIP (Screw) ×4 IMPLANT
SCREW LOCKING SD 25 (Screw) ×2 IMPLANT
SOLUTION IRRIG SURGIPHOR (IV SOLUTION) IMPLANT
SPACER HEDRON IA 24X30X15 8D (Spacer) ×1 IMPLANT
SPONGE KITTNER 5P (MISCELLANEOUS) ×2 IMPLANT
SPONGE T-LAP 18X18 ~~LOC~~+RFID (SPONGE) ×5 IMPLANT
STAPLER SKIN PROX 35W (STAPLE) ×7 IMPLANT
SURGIFLO W/THROMBIN 8M KIT (HEMOSTASIS) ×4 IMPLANT
SUT ETHILON 3-0 FS-10 30 BLK (SUTURE)
SUT MNCRL 4-0 (SUTURE) ×4
SUT MNCRL 4-0 27XMFL (SUTURE) ×3
SUT PROLENE 3 0 SH DA (SUTURE) IMPLANT
SUT PROLENE 5 0 RB 1 DA (SUTURE) ×8 IMPLANT
SUT SILK 2 0 (SUTURE) ×4
SUT SILK 2-0 30XBRD TIE 12 (SUTURE) ×3 IMPLANT
SUT SILK 3 0 REEL (SUTURE) ×3 IMPLANT
SUT SILK 3 0 TIES 17X18 (SUTURE) ×4
SUT SILK 3-0 18XBRD TIE BLK (SUTURE) IMPLANT
SUT V-LOC 90 ABS DVC 3-0 CL (SUTURE) ×2 IMPLANT
SUT VIC AB 0 CT1 27 (SUTURE) ×8
SUT VIC AB 0 CT1 27XCR 8 STRN (SUTURE) ×6 IMPLANT
SUT VIC AB 0 CT1 36 (SUTURE) ×1 IMPLANT
SUT VIC AB 2-0 CT1 (SUTURE) ×3 IMPLANT
SUT VIC AB 2-0 CT1 18 (SUTURE) ×11 IMPLANT
SUT VICRYL+ 3-0 36IN CT-1 (SUTURE) ×1 IMPLANT
SUTURE EHLN 3-0 FS-10 30 BLK (SUTURE) IMPLANT
SUTURE MNCRL 4-0 27XMF (SUTURE) ×3 IMPLANT
SYR 10ML LL (SYRINGE) ×4 IMPLANT
SYR 30ML LL (SYRINGE) ×8 IMPLANT
TOWEL OR 17X26 4PK STRL BLUE (TOWEL DISPOSABLE) ×24 IMPLANT
TRAXI PANNICULUS RETRACTOR (MISCELLANEOUS)
TRAY FOLEY MTR SLVR 16FR STAT (SET/KITS/TRAYS/PACK) ×4 IMPLANT
TROCAR INSERT W/PEDICLE NDL (TROCAR) IMPLANT
TROCAR INSERT W/PEDICLE NDLE (TROCAR)
TUBING CONNECTING 10 (TUBING) ×4 IMPLANT

## 2021-05-24 NOTE — Op Note (Signed)
Cut Off VEIN AND VASCULAR SURGERY ?  ?  ?OPERATIVE NOTE ?  ?DATE: 05/24/21 ?  ?PRE-OPERATIVE DIAGNOSIS: Lumbar adjacent segment disease with spondylolisthesis ?  ?POST-OPERATIVE DIAGNOSIS: same as above ?  ?PROCEDURE: ?1.   Anterior lumbar interbody fusion exposure via left retroperitoneal approach at L5-S1 ?  ?SURGEON: Leotis Pain MD ?  ?ASSISTANT(S): Jamesetta So, MD ?  ?ANESTHESIA: general ?  ?ESTIMATED BLOOD LOSS: 50 cc ?  ?FINDING(S): ?1.  none ?  ?  ?INDICATIONS:   ?Patient presents with need for an anterior spinal L5-S1 interbody fusion.  We are performing the exposure for neurosurgery.  Risks and benefits were discussed and informed consent was obtained. ?  ?DESCRIPTION: ?After obtaining full informed written consent, the patient was brought back to the operating room and placed supine upon the operating table.  The patient was prepped and draped in the standard fashion.  Cosurgeons are used due to the complex nature of the exposure and the adjacent large vascular structures. A left paramedian incision was created in the lower abdomen.  Care was taken to stay between the rectus and the obliques.  The fascia was divided in this location.  The peritoneum was retracted medially and a single small rent in the peritoneum was closed with Vicryl sutures.  We continued to dissect down to exposing the external iliac artery and vein.  The exposure had to be carried superiorly up to the common iliac artery and vein.  Ureter was identified and protected from harm.  Several small venous branches were ligated and divided between silk ties as needed.  We identified the internal iliac artery and vein as well.  We continued the dissection to the proximal common iliac artery and vein and tediously retracted the artery and vein medially.  This exposed the L5-S1 interbody.  The Omnitract retractor was used as were renal vein retractors and the L5-S1 interbody was exposed to an adequate fashion for fusion by Dr. Izora Ribas. ?After  Dr. Izora Ribas completed his portion of the procedure, we proceeded with closure.  The deep fascia was closed with a running layer of 0 Vicryl.  A more superficial layer was then closed with a running layer of 2-0 Vicryl in the subcutaneous tissue was closed with 3-0 Vicryl.  Skin was coapted with staples.  Sterile dressing was placed. ?At this point, we turned the case back over to Dr. Cari Caraway for the posterior portion of the procedure. ?  ?COMPLICATIONS: None ?  ?CONDITION: Stable ?  ?Leotis Pain ?  ?12/02/2019, 3:29 PM ?  ?  ?  ?This note was created with Dragon Medical transcription system. Any errors in dictation are purely unintentional.  ?

## 2021-05-24 NOTE — Transfer of Care (Signed)
Immediate Anesthesia Transfer of Care Note ? ?Patient: Brent [name]. ? ?Procedure(s) Performed: L5-S1 ANTERIOR LUMBAR INTERBODY FUSION AND POSTERIOR SPINAL FUSION (Spine Lumbar) ?APPLICATION OF INTRAOPERATIVE CT SCAN ?ABDOMINAL EXPOSURE (Abdomen) ? ?Patient Location: PACU ? ?Anesthesia Type:General ? ?Level of Consciousness: awake and drowsy ? ?Airway & Oxygen Therapy: Patient Spontanous Breathing and Patient connected to face mask oxygen ? ?Post-op Assessment: Report given to RN and Post -op Vital signs reviewed and stable ? ?Post vital signs: stable ? ?Last Vitals:  ?Vitals Value Taken Time  ?BP 158/70 05/24/21 1449  ?Temp    ?Pulse 80 05/24/21 1454  ?Resp 18 05/24/21 1454  ?SpO2 99 % 05/24/21 1454  ?Vitals shown include unvalidated device data. ? ?Last Pain:  ?Vitals:  ? 05/24/21 0735  ?TempSrc: Oral  ?PainSc: 0-No pain  ?   ? ?  ? ?Complications: No notable events documented. ?

## 2021-05-24 NOTE — Progress Notes (Signed)
Pharmacy consult for Cefazolin and Vancomycin surgical prophylaxis: ? ?76 yo M  wt 109.6 kg ? ?Will order Cefazolin 2gm IV x 1 pre-op ? ?Will order Vancomycin '1500mg'$  IV x 1 pre-op  ? ? ?Chinita Greenland PharmD ?Clinical Pharmacist ?05/24/2021 ? ?

## 2021-05-24 NOTE — Op Note (Signed)
Indications: 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 ?He failed conservative management prompting surgical consideration. ? ?Findings: partial correction of anterolisthesis.   ? 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 ? ?EBL: 750 ml ?IVF: see AR ml ?Drains: none ?Disposition: Extubated and Stable to PACU ?Complications: none ? ?A foley catheter was placed. ? ? ?Preoperative Note:  ? ?Risks of surgery discussed include: infection, bleeding, stroke, coma, death, paralysis, CSF leak, nerve/spinal cord injury, numbness, tingling, weakness, complex regional pain syndrome, recurrent stenosis and/or disc herniation, vascular injury, development of instability, neck/back pain, need for further surgery, persistent symptoms, development of deformity, and the risks of anesthesia. The patient understood these risks and agreed to proceed. ? ?Please note that Drs. Dew and Schnier acted as co-surgeons for access to the anterior lumbar spine.  They will dictate a separate note.  ? ?NAME OF ANTERIOR PROCEDURE:               ?1. Anterior lumbar interbody fusion via a retroperitoneal approach at L5/S1 ?2. Placement of a Lordotic Globus Hedron interbody cage, filled with Demineralized Bone Matrix ? ?NAME OF POSTERIOR PROCEDURE: ?1. Posterior instrumentation using Globus Creo Instrumentation ?2. Posterolateral fusion, L5-S1  ?3. Use of stereotaxis ([facility]) ? ? ?PROCEDURE:  Patient was brought to the operating room, intubated, turned to the lateral position.  All pressure points were checked and double-checked.  The patient was prepped and draped in the standard fashion.  ? ?Drs. Dew and Elco  scrubbed in for exposure.  After they had adequately exposed the operative site, I scrubbed in and confirmed localization.   ? ?Due to tethering of the iliac vein, a full anterior  exposure was not obtainable.  Thus, we used a slightly oblique approach from the left side of the patient.   ? ?We then proceeded to perform discectomy at L5/S1. I utilized a combination of rongeurs, rasp, curettes, and Kerrison punches to remove the majority of the disc.  We then introduced the trials and sized up to an appropriate size with good grip.  The endplates were prepared for arthrodesis.  We then introduced the Globus Hedron interbody device, filled with DBM.  This was secured into position using screws at L5 and S1.  The final tightener was engaged. ? ?Final radiographs were taken to confirm adequate placement of the graft.  Vascular surgery then scrubbed back in for closure. ? ? ? ?After closing the anterior part in layers, the patient was repositioned into prone position.  All pressure points were checked and double-checked. ? ?The posterior operative site was prepped and draped in standard fashion.  The stereotactic array was placed.  Stereotactic images were acquired using intraoperative CT scanning.  This was registered to the patient.  Using stereotaxis, screw trajectories were planned and incisions made.  The pedicles from L5 to S1 were cannulated bilaterally and K wires used to secure the tracks.  We then utilized a stereotactic screwdriver to place pedicle screws from L5 to S1. ? ?At L5, 6.5x66m Globus Creo pedicle screws were placed. At S1, 7.5x45 was used on the R and 7.5x35 was used on the left. ? ?Once the screws were placed, the screw extensions were then linked, a path was formed for the rod and a rod was utilized to connect the screws.  We then compressed, torqued / counter-torqued and removed the screw assembly. Once  performed on each side, the C-arm was brought back and to take confirmatory CT scan showing appropriate placement of all instrumentation and anatomic alignment.   ? ?Posterolateral arthrodesis was performed at L5-S1. ? ?Again we confirmed radiographically and began our  closure.  The wound was closed using 0 Vicryl interrupted suture in the fascia, 2-0 Vicryl inverted suture were placed in the subcutaneous tissue and dermis. 3-0 monocryl was used for final closure. Dermabond was used to close the skin.   ? ?Needle, lap and all counts were correct at the end of the case.   ? ? ?Cooper Render PA assisted in the entire procedure. ? ?Meade Maw MD ?Neurosurgery ? ? ? ? ?

## 2021-05-24 NOTE — Anesthesia Procedure Notes (Signed)
Procedure Name: Intubation ?Date/Time: 05/24/2021 8:47 AM ?Performed by: Natasha Mead, CRNA ?Pre-anesthesia Checklist: Patient identified, Emergency Drugs available, Suction available and Patient being monitored ?Patient Re-evaluated:Patient Re-evaluated prior to induction ?Oxygen Delivery Method: Circle system utilized ?Preoxygenation: Pre-oxygenation with 100% oxygen ?Induction Type: IV induction ?Ventilation: Mask ventilation without difficulty ?Laryngoscope Size: McGraph and 3 ?Grade View: Grade I ?Tube type: Oral ?Tube size: 7.5 mm ?Number of attempts: 1 ?Airway Equipment and Method: Stylet and Oral airway ?Placement Confirmation: ETT inserted through vocal cords under direct vision, positive ETCO2 and breath sounds checked- equal and bilateral ?Secured at: 20 cm ?Tube secured with: Tape ?Dental Injury: Teeth and Oropharynx as per pre-operative assessment  ? ? ? ? ?

## 2021-05-24 NOTE — Plan of Care (Signed)

## 2021-05-24 NOTE — Anesthesia Preprocedure Evaluation (Signed)
Anesthesia Evaluation  ?Patient identified by MRN, date of birth, ID band ?Patient awake ? ? ? ?Reviewed: ?Allergy & Precautions, NPO status , Patient's Chart, lab work & pertinent test results ? ?History of Anesthesia Complications ?Negative for: history of anesthetic complications ? ?Airway ?Mallampati: II ? ?TM Distance: >3 FB ?Neck ROM: Full ? ? ? Dental ? ?(+) Dental Advidsory Given, Poor Dentition, Missing ?  ?Pulmonary ?neg pulmonary ROS, neg shortness of breath, neg sleep apnea, neg COPD, neg recent URI, Patient abstained from smoking.Not current smoker, former smoker,  ?  ?Pulmonary exam normal ?breath sounds clear to auscultation ? ? ? ? ? ? Cardiovascular ?Exercise Tolerance: Good ?METShypertension, Pt. on medications ?(-) angina(-) CAD, (-) Past MI and (-) CHF (-) dysrhythmias (-) Valvular Problems/Murmurs ?Rhythm:Regular Rate:Normal ?- Systolic murmurs ? ?  ?Neuro/Psych ?neg Seizures negative neurological ROS ? negative psych ROS  ? GI/Hepatic ?Neg liver ROS, PUD, GERD  Medicated,  ?Endo/Other  ?diabetes, Type 2, Oral Hypoglycemic Agents ? Renal/GU ?negative Renal ROS  ? ?  ?Musculoskeletal ? ? Abdominal ?  ?Peds ? Hematology ?  ?Anesthesia Other Findings ?Past Medical History: ?No date: Arthritis ?    Comment:  FINGERS ?No date: Diabetes mellitus without complication ([place]) ?No date: GERD (gastroesophageal reflux disease) ?    Comment:  RARE ?No date: Hyperlipidemia ?No date: Hypertension ?03/22/2014: Internal hemorrhoids ?No date: Obesity ?No date: Spherocytosis, hereditary ([place]) ?    Comment:  HAD SPLEEN REMOVED DUE TO THIS IN 7TH GRADE ?03/22/2014: Tubular adenoma of colon ? Reproductive/Obstetrics ? ?  ? ? ? ? ? ? ? ? ? ? ? ? ? ?  ?  ? ? ? ? ? ? ? ? ?Anesthesia Physical ? ?Anesthesia Plan ? ?ASA: 3 ? ?Anesthesia Plan: General  ? ?Post-op Pain Management: Minimal or no pain anticipated  ? ?Induction: Intravenous ? ?PONV Risk Score and Plan: 2 and Ondansetron,  Propofol infusion, TIVA, Midazolam and Treatment may vary due to age or medical condition ? ?Airway Management Planned: Oral ETT ? ?Additional Equipment: None ? ?Intra-op Plan:  ? ?Post-operative Plan: Extubation in OR ? ?Informed Consent: I have reviewed the patients History and Physical, chart, labs and discussed the procedure including the risks, benefits and alternatives for the proposed anesthesia with the patient or authorized representative who has indicated his/her understanding and acceptance.  ? ? ? ?Dental advisory given ? ?Plan Discussed with: CRNA and Surgeon ? ?Anesthesia Plan Comments: (Discussed risks of anesthesia with patient, including possibility of difficulty with spontaneous ventilation under anesthesia necessitating airway intervention, PONV, and rare risks such as cardiac or respiratory or neurological events, and allergic reactions. Discussed the role of CRNA in patient's perioperative care. Patient understands.)  ? ? ? ? ? ? ?Anesthesia Quick Evaluation ? ?

## 2021-05-24 NOTE — H&P (Signed)
History of Present Illness: ?05/24/2021 ?Brent [name] presents today with continued symptoms. ? ?03/28/2021  ?Brent [name] presents today with continued pain. He has tried physical therapy and medications. He still has symptoms. ? ?02/02/2021 ?Brent [name] is here today with a chief complaint of pain at the top of his buttocks bilaterally and intermittent posterior thigh pain. ? ?He has been having problems for about 2 years. Is been worsening over time. He does have some pain in his buttocks, but primarily has back pain that worsens when he stands in 1 position. He also gets pain when he walks. Sitting down makes it go away. He has tried physical therapy without improvement. This is now impacting his day-to-day life. ? ?Bowel/Bladder Dysfunction: none ? ?Conservative measures:  ?Physical therapy: has participated in at [facility] from March to May 2022 without relief ?Multimodal medical therapy including regular antiinflammatories: aleve, tylenol, ibuprofen ?Injections: has received epidural steroid injections without relief ?12/22/20: Bilateral L3 and L4 medial branch and dorsal rami blocks ?10/21/20: Bilateral L4-5 ESI ?10/03/20: Bilateral S1 ESI ? ?Past Surgery: none ? ?Brent Canes. has no symptoms of cervical myelopathy. ? ?The symptoms are causing a significant impact on the patient's life.  ? ?Review of Systems:  ?A 10 point review of systems is negative, except for the pertinent positives and negatives detailed in the HPI. ? ?Past Medical History: ?Past Medical History:  ?Diagnosis Date  ? Chronic osteoarthritis (left knee - xray 04/2018) 03/11/2019  ? 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 Hyperlipidemia  ?LDL 103- 12/2012  ? Internal hemorrhoids 03/22/2014  ? Obesity  ? Tubular adenoma of colon 03/22/2014  ? ?Past Surgical History: ?Past Surgical History:  ?Procedure Laterality Date  ? COLONOSCOPY 03/22/2014  ?Dr. Dorthy Cooler @ [facility] - [facility] ? ENDOSCOPIC CARPAL TUNNEL RELEASE Left 10/12/2015  ?Dr.Menz  ? COLONOSCOPY 08/05/2017  ?Tubular Adenoma: [facility] 08/2020  ? CHOLECYSTECTOMY 07/07/2018  ?Dr ELesli Albee ? UMBILICAL HERNIA REPAIR [id number] ?Dr ELesli Albee--- Debridment of abd wall  ? Right carpal tunnel release 01/06/2020  ?Dr HMarry Guan ? SPLENECTOMY  ? ?No Known Allergies ? ?Current Meds  ?Medication Sig  ? acetaminophen (TYLENOL) 325 MG tablet Take 325 mg by mouth every 6 (six) hours as needed for mild pain. (Patient not taking: Reported on 05/15/2021)  ? aspirin EC 81 MG tablet Take 81 mg by mouth daily.   ? atorvastatin (LIPITOR) 40 MG tablet Take 40 mg by mouth at bedtime.   ? gabapentin (NEURONTIN) 300 MG capsule Take 300 mg by mouth at bedtime.  ? ibuprofen (ADVIL) 200 MG tablet Take 800 mg by mouth every 6 (six) hours as needed for moderate pain.  ? lisinopril (PRINIVIL,ZESTRIL) 40 MG tablet Take 40 mg by mouth every morning.   ? metFORMIN (GLUCOPHAGE) 500 MG tablet Take 500-1,000 mg by mouth See admin instructions. Take 1000 mg by mouth in the morning and 500 mg at night  ? Multiple Vitamin (MULTIVITAMIN WITH MINERALS) TABS tablet Take 1 tablet by mouth daily. (Patient not taking: Reported on 05/15/2021)  ? Multiple Vitamin (MULTIVITAMIN) tablet Take 2 tablets by mouth daily. Cinnamon  ? Multiple Vitamins-Minerals (MENS 50+ MULTI VITAMIN/MIN) TABS Take 1 tablet by mouth daily.  ? naproxen sodium (ALEVE) 220 MG tablet Take 220-440 mg by mouth daily as needed (pain).   ? Omega-3 Fatty Acids (FISH OIL) 1000 MG CAPS Take 1,000  mg by mouth in the morning and at bedtime.  ? polyvinyl alcohol (LIQUIFILM TEARS) 1.4 % ophthalmic solution Place 1 drop into both eyes as needed for dry eyes.  ? sildenafil (VIAGRA) 100 MG tablet Take 100 mg by mouth at bedtime as needed for erectile dysfunction.   ? ? ?Social History: ?Social History  ? ?Tobacco Use  ? Smoking status: Former  ?Packs/day: 0.25  ?Years: 20.00  ?Pack years: 5.00   ?Types: Cigars, Cigarettes  ?Quit date: 02/05/1993  ?Years since quitting: 28.1  ? Smokeless tobacco: Never  ? Tobacco comments:  ?Smoke 10 cigars a day x 20 years, 1/2 cigar burned before smoking whole cigar  ?Vaping Use  ? Vaping Use: Never used  ?Substance Use Topics  ? Alcohol use: No  ?Alcohol/week: 0.0 standard drinks  ? Drug use: No  ? ?Family Medical History: ?Family History  ?Problem Relation Age of Onset  ? No Known Problems Mother  ? Diabetes type II Father  ? Kidney disease Father  ? Heart disease Father  ? Colon polyps Father  ? No Known Problems Sister  ? No Known Problems Sister  ? ?Physical Examination: ? ?Vitals:  ? 05/24/21 0735  ?BP: (!) 192/88  ?Pulse: 81  ?Resp: 16  ?Temp: 98 ?F (36.7 ?C)  ?SpO2: 96%  ? ?Heart sounds normal no MRG. Chest Clear to Auscultation Bilaterally. ? ?General: Patient is well developed, well nourished, calm, collected, and in no apparent distress. Attention to examination is appropriate. ? ?Psychiatric: Patient is non-anxious. ? ?Head: Pupils equal, round, and reactive to light. ? ?ENT: Oral mucosa appears well hydrated. ? ?Neck: Supple. Full range of motion. ? ?Respiratory: Patient is breathing without any difficulty. ? ?Extremities: No edema. ? ?Vascular: Palpable dorsal pedal pulses. ? ?Skin: On exposed skin, there are no abnormal skin lesions. ? ?NEUROLOGICAL:  ? ?Awake, alert, oriented to person, place, and time. Speech is clear and fluent. Fund of knowledge is appropriate.  ? ?Cranial Nerves: Pupils equal round and reactive to light. Facial tone is symmetric. Facial sensation is symmetric. Shoulder shrug is symmetric. Tongue protrusion is midline. There is no pronator drift. ? ?Strength: ?Side Biceps Triceps Deltoid Interossei Grip Wrist Ext. Wrist Flex.  ?R '5 5 5 5 5 5 5  '$ ?L '5 5 5 5 5 5 5  '$ ? ?Side Iliopsoas Quads Hamstring PF DF EHL  ?R '5 5 5 5 5 5  '$ ?L '5 5 5 5 5 5  '$ ? ?Reflexes are 1+ and symmetric at the biceps, triceps, brachioradialis, patella and achilles.  Hoffman's is absent.  ?Clonus is not present. Toes are down-going.  ?Bilateral upper and lower extremity sensation is intact to light touch.  ?Gait is antalgic.  ?No evidence of dysmetria noted. ? ?Medical Decision Making ? ?Imaging: ?MRI L spine 09/15/2020 ?L3-4: Minimal bulging of the disc. Bilateral facet and ligamentous  ?hypertrophy. Moderate multifactorial spinal stenosis with crowding  ?of the nerve roots. Stenosis at this level could be symptomatic.  ? ?L4-5: Minimal bulging of the disc. Facet and ligamentous  ?hypertrophy. No compressive canal or foraminal stenosis at this  ?level.  ? ?L5-S1: Chronic bilateral pars defects with anterolisthesis of 11 mm.  ?Pseudo disc herniation. Stenosis of the central canal that could be  ?symptomatic. Severe bilateral foraminal stenosis that could well  ?affect either L5 nerve.  ? ?IMPRESSION:  ?Chronic bilateral pars defects at L5. Anterolisthesis, increased  ?from 7 mm on the CT scan of last year to 11 mm on today's  MRI.  ?Pseudo disc herniation. Stenosis of the central canal that could  ?cause neural compression. Severe bilateral foraminal stenosis that  ?could compress either or both L5 nerves.  ? ?L3-4: Moderate multifactorial spinal stenosis due to bulging of the  ?disc in combination with facet and ligamentous hypertrophy. This  ?stenosis could be symptomatic.  ? ?Mild canal encroachment by facet and ligamentous hypertrophy at L2-3  ?and L4-5 but without apparent compressive stenosis at those levels.  ? ?Electronically Signed  ?  By: Nelson Chimes M.D.  ?  On: 09/15/2020 21:22 ? ?I have personally reviewed the images and agree with the above interpretation. ? ?Assessment and Plan: ?Brent [name] is a pleasant 76 y.o. male with grade 2 anterolisthesis at L5 on S1 that was not present in 2012. It has worsened over time and was 7 mm in 2021 is now 11 mm. ? ?He has failed conservative management. I have recommended an L5-S1 anterior lumbar interbody fusion with percutaneous  fixation.  Drs. Dew and Schnier will assist with exposure. ? ? ?Meade Maw MD, MPHS ?Department of Neurosurgery  ?

## 2021-05-24 NOTE — Interval H&P Note (Signed)
History and Physical Interval Note: ? ?05/24/2021 ?8:22 AM ? ?Brent [name].  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  The various methods of treatment have been discussed with the patient and family. After consideration of risks, benefits and other options for treatment, the patient has consented to  Procedure(s): ?L5-S1 ANTERIOR LUMBAR INTERBODY FUSION AND POSTERIOR SPINAL FUSION (N/A) ?APPLICATION OF INTRAOPERATIVE CT SCAN (N/A) ?ABDOMINAL EXPOSURE (N/A) as a surgical intervention.  The patient's history has been reviewed, patient examined, no change in status, stable for surgery.  I have reviewed the patient's chart and labs.  Questions were answered to the patient's satisfaction.   ? ? ?Leotis Pain ? ? ?

## 2021-05-25 LAB — CREATININE, SERUM
Creatinine, Ser: 1.22 mg/dL (ref 0.61–1.24)
GFR, Estimated: >60 mL/min (ref >60)

## 2021-05-25 LAB — CBC
HCT: 33.4 % — ABNORMAL LOW (ref 39.0–52.0)
Hemoglobin: 11.5 g/dL — ABNORMAL LOW (ref 13.0–17.0)
MCH: 30.6 pg (ref 26.0–34.0)
MCHC: 34.4 g/dL (ref 30.0–36.0)
MCV: 88.8 fL (ref 80.0–100.0)
Platelets: 347 10*3/uL (ref 150–400)
RBC: 3.76 MIL/uL — ABNORMAL LOW (ref 4.22–5.81)
RDW: 13.7 % (ref 11.5–15.5)
WBC: 23.7 10*3/uL — ABNORMAL HIGH (ref 4.0–10.5)
nRBC: 0 % (ref 0.0–0.2)

## 2021-05-25 LAB — GLUCOSE, CAPILLARY
Glucose-Capillary: 173 mg/dL — ABNORMAL HIGH (ref 70–99)
Glucose-Capillary: 208 mg/dL — ABNORMAL HIGH (ref 70–99)
Glucose-Capillary: 190 mg/dL — ABNORMAL HIGH (ref 70–99)

## 2021-05-25 MED ORDER — POLYETHYLENE GLYCOL 3350 17 G PO PACK
17.0000 g | PACK | Freq: Every day | ORAL | Status: DC
  Administered 2021-05-25 – 2021-05-26 (×2): 17 g via ORAL
  Filled 2021-05-25 (×3): qty 1

## 2021-05-25 NOTE — Progress Notes (Signed)
? ?   Attending Progress Note ? ?History: Brent [name]. S/p L5-S1 ALIF  ? ?POD1: Patient reports he is passing gas overnight but has yet to have a bowel movement.  His Foley was removed this morning and he has yet to urinate independently.  He is eating without any significant abdominal pain.  He reports improvement in his buttock pain. ? ?Physical Exam: ?Vitals:  ? 05/25/21 0517 05/25/21 0732  ?BP: 126/72 138/64  ?Pulse: 75 62  ?Resp: 16 16  ?Temp: 98.2 ?F (36.8 ?C) 98.1 ?F (36.7 ?C)  ?SpO2: 100% 99%  ? ? ?AA Ox3 ?CNI ? ?Strength:5/5 throughout BLE  ?Incisions c/d/I with post-op bandages in place.  ? ?Data: ? ?Recent Labs  ?Lab 05/25/21 ?5670  ?CREATININE 1.22  ? ?No results for input(s): AST, ALT, ALKPHOS in the last 168 hours. ? ?Invalid input(s): TBILI  ? Recent Labs  ?Lab 05/25/21 ?1410  ?WBC 23.7*  ?HGB 11.5*  ?HCT 33.4*  ?PLT 347  ? ?No results for input(s): APTT, INR in the last 168 hours.  ?   ? ? ?Other tests/results: none  ? ?Assessment/Plan: ? ?Brent [name]. is a 76 year old with grade 2 spondylolisthesis status post L5-S1 ALIF on 05/24/2021. ? ?- mobilize ?- pain control ?- DVT prophylaxis ?- PTOT; has HH established with [facility] at discharge  ? ?Cooper Render PA-C ?Department of Neurosurgery ? ?  ?

## 2021-05-25 NOTE — Evaluation (Signed)
Physical Therapy Evaluation ?Patient Details ?Name: Brent [name]. ?MRN: [id number] ?DOB: [birth year] ?Today's Date: 05/25/2021 ? ?History of Present Illness ? Pt is s/p ALIF L5-S1 on 05/24/21. PMH includes: DM type 2, HLD.  ?Clinical Impression ? Pt admitted with above diagnosis. Pt received seated EOB agreeable to PT services. Pt reports at baseline he is indep with ADL's/IADL's and gait, still driving. Pt educated on don/doffing LSO and education on back precautions. Sensation to LT intact bilat in LE dermatomes. Pt able to stand with supervision with need to reach to counter top.  Static standing tolerated without unsteadiness or sway. Given RW with ability to ambulate 200' with supervision requiring frequent VC's for RW proximity and maintaining feet within RW BOS with turns with good carryover towards end of session. Pt also required initially VC's for improving upright posture with gait with good carryover. Pt demonstrating excellent stability with stairs bur does require frequent VC's for single rail use. Pt returned to room with performance of log roll technique in/out of bed with intermittent multimodal cues. Pt endorsing good understanding and improvement in low back symptoms with technique. OT entering room with all needs in reach. Pt safe to d/c home with [facility] services. Would benefit from additional session for gait attempts without AD. Pt currently with functional limitations due to the deficits listed below (see PT Problem List). Pt will benefit from skilled PT to increase their independence and safety with mobility to allow discharge to the venue listed below.  ?   ? ?Recommendations for follow up therapy are one component of a multi-disciplinary discharge planning process, led by the attending physician.  Recommendations may be updated based on patient status, additional functional criteria and insurance authorization. ? ?Follow Up Recommendations Home health PT ? ?  ?Assistance Recommended at  Discharge Intermittent Supervision/Assistance  ?Patient can return home with the following ? A little help with walking and/or transfers;A little help with bathing/dressing/bathroom;Assist for transportation;Help with stairs or ramp for entrance ? ?  ?Equipment Recommendations None recommended by PT  ?Recommendations for Other Services ?    ?  ?Functional Status Assessment Patient has had a recent decline in their functional status and demonstrates the ability to make significant improvements in function in a reasonable and predictable amount of time.  ? ?  ?Precautions / Restrictions Precautions ?Precautions: Back ?Precaution Booklet Issued: No ?Precaution Comments: Per MD orders, no brace needed for ambulatingto bathroom. ?Required Braces or Orthoses: Spinal Brace ?Spinal Brace: Lumbar corset;Applied in sitting position ?Restrictions ?Weight Bearing Restrictions: No  ? ?  ? ?Mobility ? Bed Mobility ?Overal bed mobility: Modified Independent ?  ?  ?  ?  ?  ?  ?General bed mobility comments: Education provided on log roll technique ?Patient Response: Cooperative ? ?Transfers ?Overall transfer level: Needs assistance ?Equipment used: None ?Transfers: Sit to/from Stand ?Sit to Stand: Supervision ?  ?  ?  ?  ?  ?General transfer comment: Reaches for counter initially to stand ?  ? ?Ambulation/Gait ?Ambulation/Gait assistance: Supervision ?Gait Distance (Feet): 200 Feet ?Assistive device: Rolling walker (2 wheels) ?Gait Pattern/deviations: Step-through pattern, Trunk flexed, Decreased step length - right, Decreased step length - left ?  ?  ?  ?  ? ?Stairs ?Stairs: Yes ?Stairs assistance: Supervision ?Stair Management: One rail Right, One rail Left, Step to pattern, Sideways ?Number of Stairs: 8 (asc/desc 4 stairs x2) ?General stair comments: Education and min VC's for positoning and hand placement. Excellent stability and carryover on second bout asc/desc ? ?  Wheelchair Mobility ?  ? ?Modified Rankin (Stroke Patients  Only) ?  ? ?  ? ?Balance Overall balance assessment: Needs assistance ?Sitting-balance support: Feet supported ?Sitting balance-Leahy Scale: Fair ?  ?  ?  ?Standing balance-Leahy Scale: Fair ?Standing balance comment: Toleraters standing without AD ?  ?  ?  ?  ?  ?  ?  ?  ?  ?  ?  ?   ? ? ? ?Pertinent Vitals/Pain Pain Assessment ?Pain Assessment: No/denies pain  ? ? ?Home Living Family/patient expects to be discharged to:: Private residence ?Living Arrangements: Spouse/significant other ?Available Help at Discharge: Family;Available 24 hours/day ?Type of Home: House ?Home Access: Stairs to enter ?Entrance Stairs-Rails: Right;Left ?Entrance Stairs-Number of Steps: 3 ?  ?Home Layout: One level ?Home Equipment: Conservation officer, nature (2 wheels);Cane - single point;Rollator (4 wheels);Shower seat;BSC/3in1 ?Additional Comments: Has multitude of DME from parents. Shower seat at [employer]  ?  ?Prior Function Prior Level of Function : Independent/Modified Independent ?  ?  ?  ?  ?  ?  ?  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  ? ? ?Hand Dominance  ? Dominant Hand: Right ? ?  ?Extremity/Trunk Assessment  ? Upper Extremity Assessment ?Upper Extremity Assessment: Overall WFL for tasks assessed ?  ? ?Lower Extremity Assessment ?Lower Extremity Assessment: Overall WFL for tasks assessed ?  ? ?Cervical / Trunk Assessment ?Cervical / Trunk Assessment: Back Surgery  ?Communication  ? Communication: No difficulties  ?Cognition Arousal/Alertness: Awake/alert ?Behavior During Therapy: [facility] for tasks assessed/performed ?Overall Cognitive Status: Within Functional Limits for tasks assessed ?  ?  ?  ?  ?  ?  ?  ?  ?  ?  ?  ?  ?  ?  ?  ?  ?  ?  ?  ? ?  ?General Comments   ? ?  ?Exercises Other Exercises ?Other Exercises: Role of PT in acute setting, don/doffing brace, log roll technique, stair assessment  ? ?Assessment/Plan  ?  ?PT Assessment Patient needs continued PT services  ?PT Problem List Decreased  strength;Decreased mobility;Obesity;Decreased activity tolerance;Decreased balance;Decreased knowledge of use of DME ? ?   ?  ?PT Treatment Interventions DME instruction;Therapeutic exercise;Gait training;Balance training;Stair training;Neuromuscular re-education;Functional mobility training;Therapeutic activities;Patient/family education   ? ?PT Goals (Current goals can be found in the Care Plan section)  ?Acute Rehab PT Goals ?Patient Stated Goal: To return home ?PT Goal Formulation: With patient ?Time For Goal Achievement: 06/08/21 ?Potential to Achieve Goals: Good ? ?  ?Frequency 7X/week ?  ? ? ?Co-evaluation   ?  ?  ?  ?  ? ? ?  ?AM-PAC PT "6 Clicks" Mobility  ?Outcome Measure Help needed turning from your back to your side while in a flat bed without using bedrails?: A Little ?Help needed moving from lying on your back to sitting on the side of a flat bed without using bedrails?: A Little ?Help needed moving to and from a bed to a chair (including a wheelchair)?: A Little ?Help needed standing up from a chair using your arms (e.g., wheelchair or bedside chair)?: A Little ?Help needed to walk in hospital room?: A Little ?Help needed climbing 3-5 steps with a railing? : A Little ?6 Click Score: 18 ? ?  ?End of Session Equipment Utilized During Treatment: Gait belt ?Activity Tolerance: Patient tolerated treatment well ?Patient left: Other (comment) (seated EOB in care of OT) ?Nurse Communication: Mobility status ?PT Visit Diagnosis: Other abnormalities of gait and  mobility (R26.89);Muscle weakness (generalized) (M62.81);Difficulty in walking, not elsewhere classified (R26.2) ?  ? ?Time: [id number] ?PT Time Calculation (min) (ACUTE ONLY): 19 min ? ? ?Charges:   PT Evaluation ?$PT Eval Low Complexity: 1 Low ?PT Treatments ?$Gait Training: 8-22 mins ?  ?   ? ? ?Salem Caster. Fairly IV, PT, DPT ?Physical Therapist- [place]  ?[facility]  ?05/25/2021, 9:49 AM ? ?

## 2021-05-25 NOTE — Progress Notes (Signed)
[facility] ? ?Daily Progress Note ? ? ?Subjective  -  ? ?Appropriate post op [name]. No major events overnight. Wants to go home ? ?Objective ?Vitals:  ? 05/24/21 1747 05/24/21 2120 05/25/21 0517 05/25/21 0732  ?BP: 119/68 137/60 126/72 138/64  ?Pulse: 82 79 75 62  ?Resp: '18 14 16 16  '$ ?Temp:  98.2 ?F (36.8 ?C) 98.2 ?F (36.8 ?C) 98.1 ?F (36.7 ?C)  ?TempSrc:      ?SpO2:  95% 100% 99%  ?Weight: 109.6 kg     ?Height: '5\' 8"'$  (1.727 m)     ? ? ?Intake/Output Summary (Last 24 hours) at 05/25/2021 1443 ?Last data filed at 05/25/2021 1407 ?Gross per 24 hour  ?Intake 1201.92 ml  ?Output 1540 ml  ?Net -338.08 ml  ? ? ?PULM  CTAB ?CV  RRR ?VASC  Incision is C/D/I ? ?Laboratory ?CBC ?   ?Component Value Date/Time  ? WBC 23.7 (H) 05/25/2021 0513  ? HGB 11.5 (L) 05/25/2021 0513  ? HCT 33.4 (L) 05/25/2021 0513  ? PLT 347 05/25/2021 0513  ? ? ?BMET ?   ?Component Value Date/Time  ? NA 141 05/15/2021 1004  ? K 4.2 05/15/2021 1004  ? CL 104 05/15/2021 1004  ? CO2 28 05/15/2021 1004  ? GLUCOSE 156 (H) 05/15/2021 1004  ? BUN 18 05/15/2021 1004  ? CREATININE 1.22 05/25/2021 0513  ? CALCIUM 9.2 05/15/2021 1004  ? GFRNONAA >60 05/25/2021 0513  ? GFRAA >60 07/21/2018 0356  ? ? ?Assessment/Planning: ?POD #1 s/p L5-S1 anterior approach for ALIF ? ?Doing well ?Incision is C/D/I ?No significant left leg swelling concerning for DVT ?No other recs from vascular POV ? ? ?Brent [name] ? ?05/25/2021, 2:43 PM ? ? ? ?  ?

## 2021-05-25 NOTE — Anesthesia Postprocedure Evaluation (Signed)
Anesthesia Post Note ? ?Patient: Brent [name]. ? ?Procedure(s) Performed: L5-S1 ANTERIOR LUMBAR INTERBODY FUSION AND POSTERIOR SPINAL FUSION (Spine Lumbar) ?APPLICATION OF INTRAOPERATIVE CT SCAN ?ABDOMINAL EXPOSURE (Abdomen) ? ?Patient location during evaluation: PACU ?Anesthesia Type: General ?Level of consciousness: awake and alert ?Pain management: pain level controlled ?Vital Signs Assessment: post-procedure vital signs reviewed and stable ?Respiratory status: spontaneous breathing, nonlabored ventilation, respiratory function stable and patient connected to nasal cannula oxygen ?Cardiovascular status: blood pressure returned to baseline and stable ?Postop Assessment: no apparent nausea or vomiting ?Anesthetic complications: no ? ? ?No notable events documented. ? ? ?Last Vitals:  ?Vitals:  ? 05/25/21 0517 05/25/21 0732  ?BP: 126/72 138/64  ?Pulse: 75 62  ?Resp: 16 16  ?Temp: 36.8 ?C 36.7 ?C  ?SpO2: 100% 99%  ?  ?Last Pain:  ?Vitals:  ? 05/25/21 1000  ?TempSrc:   ?PainSc: 2   ? ? ?  ?  ?  ?  ?  ?  ? ?Martha Clan ? ? ? ? ?

## 2021-05-25 NOTE — Progress Notes (Signed)
Met with the patient and reviewed the DC plan and needs ?He lives at home with his wife ?She provides transportation' He has DME at home including 3 in 1, rolling walker and rolator he does not need any Additional ?He would like HH with enhabit ?Meg aware ?

## 2021-05-25 NOTE — Evaluation (Signed)
Occupational Therapy Evaluation ?Patient Details ?Name: Brent [name]. ?MRN: [id number] ?DOB: 04/07/1945 ?Today's Date: 05/25/2021 ? ? ?History of Present Illness Pt is s/p ALIF L5-S1 on 05/24/21. PMH includes: DM type 2, HLD.  ? ?Clinical Impression ?  ?Chart reviewed, pt greeted at edge of bed finishing PT session. Pt is alert and oriented x4 with appropriate safety awareness. Appropriate recall of precautions following education. Pt is +lumbar brace while amb. Pt performs all ADL/mobility at supervision level with intermittent vcs for technique with precautions except requires MAX A for LB dressing. Pt  and wife educated on use of AE, falls prevention, DME use for safe completion of ADL at home. Pt has access to a 3 in 1 commode. Pt will benefit from OT while in house to facilitate safe ADL completion, no further OT recommended following discharge. Pt is left in bedside chair, NAD, all needs met.  ?   ? ?Recommendations for follow up therapy are one component of a multi-disciplinary discharge planning process, led by the attending physician.  Recommendations may be updated based on patient status, additional functional criteria and insurance authorization.  ? ?Follow Up Recommendations ? No OT follow up  ?  ?Assistance Recommended at Discharge Intermittent Supervision/Assistance  ?Patient can return home with the following A little help with walking and/or transfers;A little help with bathing/dressing/bathroom;Assistance with cooking/housework;Help with stairs or ramp for entrance ? ?  ?Functional Status Assessment ? Patient has had a recent decline in their functional status and demonstrates the ability to make significant improvements in function in a reasonable and predictable amount of time.  ?Equipment Recommendations ? BSC/3in1;Other (comment) (pt reports he has one to utilize)  ?  ?Recommendations for Other Services   ? ? ?  ?Precautions / Restrictions Precautions ?Precautions: Back ?Precaution Booklet  Issued: No ?Precaution Comments: Per MD orders, no brace needed for ambulatingto bathroom. ?Required Braces or Orthoses: Spinal Brace ?Spinal Brace: Lumbar corset;Applied in sitting position ?Restrictions ?Weight Bearing Restrictions: No  ? ?  ? ?Mobility Bed Mobility ?  ?  ?  ?  ?  ?  ?  ?General bed mobility comments: NT recieved at edge of bed following PT ?  ? ?Transfers ?Overall transfer level: Needs assistance ?Equipment used: Rolling walker (2 wheels) ?Transfers: Sit to/from Stand ?Sit to Stand: Supervision ?  ?  ?  ?  ?  ?  ?  ? ?  ?Balance Overall balance assessment: Needs assistance ?Sitting-balance support: Feet supported ?Sitting balance-Leahy Scale: Fair ?  ?  ?Standing balance support: Bilateral upper extremity supported, During functional activity ?Standing balance-Leahy Scale: Fair ?  ?  ?  ?  ?  ?  ?  ?  ?  ?  ?  ?  ?   ? ?ADL either performed or assessed with clinical judgement  ? ?ADL Overall ADL's : Needs assistance/impaired ?Eating/Feeding: Set up ?  ?Grooming: Wash/dry hands;Wash/dry face;Standing ?Grooming Details (indicate cue type and reason): sink level ?  ?  ?  ?  ?  ?  ?Lower Body Dressing: Maximal assistance ?  ?Toilet Transfer: Supervision/safety ?Toilet Transfer Details (indicate cue type and reason): simulated to bedside chair ?Toileting- Clothing Manipulation and Hygiene: Supervision/safety ?  ?  ?  ?Functional mobility during ADLs: Supervision/safety;Rolling walker (2 wheels) ?   ? ? ? ?Vision Patient Visual Report: No change from baseline ?   ?   ?Perception   ?  ?Praxis   ?  ? ?Pertinent Vitals/Pain Pain Assessment ?Pain Assessment: No/denies  pain  ? ? ? ?Hand Dominance Right ?  ?Extremity/Trunk Assessment Upper Extremity Assessment ?Upper Extremity Assessment: Overall WFL for tasks assessed ?  ?Lower Extremity Assessment ?Lower Extremity Assessment: Overall WFL for tasks assessed ?  ?Cervical / Trunk Assessment ?Cervical / Trunk Assessment: Back Surgery ?  ?Communication  Communication ?Communication: No difficulties ?  ?Cognition Arousal/Alertness: Awake/alert ?Behavior During Therapy: [facility] for tasks assessed/performed ?Overall Cognitive Status: Within Functional Limits for tasks assessed ?  ?  ?  ?  ?  ?  ?  ?  ?  ?  ?  ?  ?  ?  ?  ?  ?  ?  ?  ?General Comments    ? ?  ?Exercises   ?  ?Shoulder Instructions    ? ? ?Home Living Family/patient expects to be discharged to:: Private residence ?Living Arrangements: Spouse/significant other ?Available Help at Discharge: Family;Available 24 hours/day ?Type of Home: House ?Home Access: Stairs to enter ?Entrance Stairs-Number of Steps: 3 ?Entrance Stairs-Rails: Right;Left ?Home Layout: One level ?  ?  ?Bathroom Shower/Tub: Tub/shower unit ?  ?Bathroom Toilet: Standard ?  ?  ?Home Equipment: Conservation officer, nature (2 wheels);Cane - single point;Rollator (4 wheels);Shower seat;BSC/3in1 ?  ?Additional Comments: Has multitude of DME from parents. Shower seat at [employer] ?  ? ?  ?Prior Functioning/Environment Prior Level of Function : Independent/Modified Independent ?  ?  ?  ?  ?  ?  ?  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  ? ?  ?  ?OT Problem List: Impaired balance (sitting and/or standing);Decreased activity tolerance ?  ?   ?OT Treatment/Interventions: Self-care/ADL training;DME and/or AE instruction;Therapeutic activities;Therapeutic exercise;Patient/family education  ?  ?OT Goals(Current goals can be found in the care plan section) Acute Rehab OT Goals ?Patient Stated Goal: go home ?OT Goal Formulation: With patient/family ?Time For Goal Achievement: 06/08/21 ?Potential to Achieve Goals: Good ?ADL Goals ?Pt Will Transfer to Toilet: with modified independence ?Pt Will Perform Toileting - Clothing Manipulation and hygiene: with modified independence;with adaptive equipment  ?OT Frequency: Min 2X/week ?  ? ?Co-evaluation   ?  ?  ?  ?  ? ?  ?AM-PAC OT "6 Clicks" Daily Activity     ?Outcome Measure Help from another person  eating meals?: None ?Help from another person taking care of personal grooming?: None ?Help from another person toileting, which includes using toliet, bedpan, or urinal?: None ?Help from another person bathing (including washing, rinsing, drying)?: None ?Help from another person to put on and taking off regular upper body clothing?: None ?Help from another person to put on and taking off regular lower body clothing?: A Lot ?6 Click Score: 22 ?  ?End of Session Equipment Utilized During Treatment: Rolling walker (2 wheels) ?Nurse Communication: Mobility status ? ?Activity Tolerance: Patient tolerated treatment well ?Patient left: in chair;with call bell/phone within reach;with chair alarm set ? ?OT Visit Diagnosis: Unsteadiness on feet (R26.81)  ?              ?Time: 5183-4373 ?OT Time Calculation (min): 16 min ?Charges:  OT General Charges ?$OT Visit: 1 Visit ?OT Evaluation ?$OT Eval Low Complexity: 1 Low ?OT Treatments ?$Self Care/Home Management : 8-22 mins ? ?Shanon Payor, OTD OTR/L  ?05/25/21, 9:54 AM  ?

## 2021-05-25 NOTE — Plan of Care (Signed)
?  Problem: Education: ?Goal: Knowledge of General Education information will improve ?Description: Including pain rating scale, medication(s)/side effects and non-pharmacologic comfort measures ?Outcome: Progressing ?  ?Problem: Health Behavior/Discharge Planning: ?Goal: Ability to manage health-related needs will improve ?Outcome: Progressing ?  ?Problem: Clinical Measurements: ?Goal: Ability to maintain clinical measurements within normal limits will improve ?Outcome: Progressing ?Goal: Will remain free from infection ?Outcome: Progressing ?Goal: Diagnostic test results will improve ?Outcome: Progressing ?Goal: Respiratory complications will improve ?Outcome: Progressing ?Goal: Cardiovascular complication will be avoided ?Outcome: Progressing ?  ?Problem: Activity: ?Goal: Risk for activity intolerance will decrease ?Outcome: Progressing ?  ?Problem: Nutrition: ?Goal: Adequate nutrition will be maintained ?Outcome: Progressing ?  ?Problem: Coping: ?Goal: Level of anxiety will decrease ?Outcome: Progressing ?  ?Problem: Elimination: ?Goal: Will not experience complications related to bowel motility ?Outcome: Progressing ?Goal: Will not experience complications related to urinary retention ?Outcome: Progressing ?  ?Problem: Safety: ?Goal: Ability to remain free from injury will improve ?Outcome: Progressing ?  ?Problem: Skin Integrity: ?Goal: Risk for impaired skin integrity will decrease ?Outcome: Progressing ?  ?Problem: Education: ?Goal: Ability to verbalize activity precautions or restrictions will improve ?Outcome: Progressing ?Goal: Knowledge of the prescribed therapeutic regimen will improve ?Outcome: Progressing ?Goal: Understanding of discharge needs will improve ?Outcome: Progressing ?  ?Problem: Activity: ?Goal: Ability to avoid complications of mobility impairment will improve ?Outcome: Progressing ?Goal: Ability to tolerate increased activity will improve ?Outcome: Progressing ?Goal: Will remain free  from falls ?Outcome: Progressing ?  ?Problem: Bowel/Gastric: ?Goal: Gastrointestinal status for postoperative course will improve ?Outcome: Progressing ?  ?Problem: Clinical Measurements: ?Goal: Ability to maintain clinical measurements within normal limits will improve ?Outcome: Progressing ?Goal: Postoperative complications will be avoided or minimized ?Outcome: Progressing ?Goal: Diagnostic test results will improve ?Outcome: Progressing ?  ?Problem: Skin Integrity: ?Goal: Will show signs of wound healing ?Outcome: Progressing ?  ?Problem: Health Behavior/Discharge Planning: ?Goal: Identification of resources available to assist in meeting health care needs will improve ?Outcome: Progressing ?  ?

## 2021-05-26 ENCOUNTER — Encounter: Payer: Self-pay | Admitting: Neurosurgery

## 2021-05-26 LAB — GLUCOSE, CAPILLARY
Glucose-Capillary: 207 mg/dL — ABNORMAL HIGH (ref 70–99)
Glucose-Capillary: 210 mg/dL — ABNORMAL HIGH (ref 70–99)
Glucose-Capillary: 238 mg/dL — ABNORMAL HIGH (ref 70–99)

## 2021-05-26 MED ORDER — ACETAMINOPHEN 500 MG PO TABS
1000.0000 mg | ORAL_TABLET | Freq: Four times a day (QID) | ORAL | Status: DC
  Administered 2021-05-26 (×3): 1000 mg via ORAL
  Filled 2021-05-26 (×3): qty 2

## 2021-05-26 MED ORDER — LACTULOSE 10 GM/15ML PO SOLN
30.0000 g | Freq: Once | ORAL | Status: AC
Start: 2021-05-26 — End: 2021-05-26
  Administered 2021-05-26: 30 g via ORAL
  Filled 2021-05-26: qty 60

## 2021-05-26 MED ORDER — TIZANIDINE HCL 4 MG PO TABS: 4.0000 mg | ORAL_TABLET | Freq: Three times a day (TID) | ORAL | 0 refills | Status: DC | PRN

## 2021-05-26 MED ORDER — OXYCODONE HCL 10 MG PO TABS
10.0000 mg | ORAL_TABLET | ORAL | 0 refills | Status: AC | PRN
Start: 2021-05-26 — End: 2021-05-31

## 2021-05-26 MED ORDER — MINERAL OIL RE ENEM
1.0000 | ENEMA | Freq: Once | RECTAL | Status: AC
  Administered 2021-05-26: 1 via RECTAL

## 2021-05-26 MED ORDER — SENNA 8.6 MG PO TABS: 1.0000 | ORAL_TABLET | Freq: Two times a day (BID) | ORAL | 0 refills | Status: DC

## 2021-05-26 NOTE — Care Management Important Message (Signed)
Important Message ? ?Patient Details  ?Name: Brent [name]. ?MRN: [id number] ?Date of Birth: 20-Mar-1945 ? ? ?Medicare Important Message Given:  N/A - LOS <3 / Initial given by admissions ? ? ? ? ?Dannette Barbara ?05/26/2021, 9:33 AM ?

## 2021-05-26 NOTE — Progress Notes (Signed)
Physical Therapy Treatment ?Patient Details ?Name: Brent [name]. ?MRN: [id number] ?DOB: 24-May-1945 ?Today's Date: 05/26/2021 ? ? ?History of Present Illness Pt is s/p ALIF L5-S1 on 05/24/21. PMH includes: DM type 2, HLD. ? ?  ?PT Comments  ? ? Pt was independently ambulating in room from BR upon arriving. He is A and O and endorsing able to have successful BM. He is eager to DC home. No safety concerns observed. Recommend HHPT to address deficits while maximizing independence with ADLs. Pt will benefit from continued skilled PT to assist him to PLOF. Pt is cleared from an acute PT standpoint for DC home.  ?  ?Recommendations for follow up therapy are one component of a multi-disciplinary discharge planning process, led by the attending physician.  Recommendations may be updated based on patient status, additional functional criteria and insurance authorization. ? ?Follow Up Recommendations ? Home health PT ?  ?  ?Assistance Recommended at Discharge Intermittent Supervision/Assistance  ?Patient can return home with the following A little help with walking and/or transfers;A little help with bathing/dressing/bathroom;Assist for transportation;Help with stairs or ramp for entrance ?  ?Equipment Recommendations ? None recommended by PT  ?  ?   ?Precautions / Restrictions Precautions ?Precautions: Back ?Precaution Booklet Issued: No ?Precaution Comments: Per MD orders, no brace needed for ambulatingto bathroom. ?Required Braces or Orthoses: Spinal Brace ?Spinal Brace: Lumbar corset;Applied in sitting position ?Restrictions ?Weight Bearing Restrictions: No  ?  ? ?Mobility ? Bed Mobility ?Overal bed mobility: Modified Independent ?  ?  ?Transfers ?Overall transfer level: Needs assistance ?Equipment used: Rolling walker (2 wheels) ?Transfers: Sit to/from Stand ?Sit to Stand: Modified independent (Device/Increase time) ?  ?Ambulation/Gait ?Ambulation/Gait assistance: Supervision ?Gait Distance (Feet): 75 Feet ?Assistive  device: Rolling walker (2 wheels) ?Gait Pattern/deviations: Step-through pattern, Trunk flexed, Decreased step length - right, Decreased step length - left ?Gait velocity: decreased ?  ?  ?General Gait Details: pt was easily and safely able to ambulate 75 ft with RW without LOB or safety concern. pain limited distance moreso than strength or mobility ? ? ?Stairs ?   ?General stair comments: pt elected not to perform stairs however Pryor Curia feels he will have no difficulty since moving so well. did educate him on proper performance. ? ? ?  ?Balance Overall balance assessment: Modified Independent ?  ?  ?  ?Cognition Arousal/Alertness: Awake/alert ?Behavior During Therapy: [facility] for tasks assessed/performed ?Overall Cognitive Status: Within Functional Limits for tasks assessed ?  ?  ?   ?   ?   ? ?Pertinent Vitals/Pain Pain Assessment ?Pain Assessment: 0-10 ?Pain Score: 5  ?Pain Location: buttocks ?Pain Descriptors / Indicators: Discomfort ?Pain Intervention(s): Limited activity within patient's tolerance, Monitored during session, Premedicated before session, Repositioned  ? ? ? ?PT Goals (current goals can now be found in the care plan section) Acute Rehab PT Goals ?Patient Stated Goal: To return home ?Progress towards PT goals: Progressing toward goals ? ?  ?Frequency ? ? ? 7X/week ? ? ? ?  ?PT Plan Current plan remains appropriate  ? ? ?   ?AM-PAC PT "6 Clicks" Mobility   ?Outcome Measure ? Help needed turning from your back to your side while in a flat bed without using bedrails?: A Little ?Help needed moving from lying on your back to sitting on the side of a flat bed without using bedrails?: A Little ?Help needed moving to and from a bed to a chair (including a wheelchair)?: A Little ?Help needed standing up from  a chair using your arms (e.g., wheelchair or bedside chair)?: A Little ?Help needed to walk in hospital room?: A Little ?Help needed climbing 3-5 steps with a railing? : A Little ?6 Click Score: 18 ? ?   ?End of Session   ?Activity Tolerance: Patient tolerated treatment well ?Patient left: in bed;with call bell/phone within reach;with bed alarm set ?Nurse Communication: Mobility status ?PT Visit Diagnosis: Other abnormalities of gait and mobility (R26.89);Muscle weakness (generalized) (M62.81);Difficulty in walking, not elsewhere classified (R26.2) ?  ? ? ?Time: 1281-1886 ?PT Time Calculation (min) (ACUTE ONLY): 13 min ? ?Charges:  $Gait Training: 8-22 mins          ?          ? ?Julaine Fusi PTA ?05/26/21, 10:26 AM  ? ?

## 2021-05-26 NOTE — Progress Notes (Signed)
Occupational Therapy Treatment ?Patient Details ?Name: Brent [name]. ?MRN: [id number] ?DOB: 08-17-1945 ?Today's Date: 05/26/2021 ? ? ?History of present illness Pt is s/p ALIF L5-S1 on 05/24/21. PMH includes: DM type 2, HLD. ?  ?OT comments ? Chart reviewed pt greeted in bed agreeable to OT tx session. Tx session targeted ensuring pt is performing safe ADL within precautions to facilitate safe discharge home. Pt recalled 0/3 spinal precautions however demonstrated appropriate adherence to 3/3 throughout ADL/functional mobility. Two attempts completed for bed mobility as pt reporting difficulties with log roll technique. Good performance and carry over in between attempts with use of L bed rail (pt reports he has L bed rail at home. Pt is making progress towards goals set forth, will continue to benefit from ongoing OT to address functional deficits. Pt is left as received, NAD, all needs met. OT will continue to follow.   ? ?Recommendations for follow up therapy are one component of a multi-disciplinary discharge planning process, led by the attending physician.  Recommendations may be updated based on patient status, additional functional criteria and insurance authorization. ?   ?Follow Up Recommendations ? No OT follow up  ?  ?Assistance Recommended at Discharge Intermittent Supervision/Assistance  ?Patient can return home with the following ? A little help with walking and/or transfers;A little help with bathing/dressing/bathroom;Assistance with cooking/housework;Help with stairs or ramp for entrance ?  ?Equipment Recommendations ? BSC/3in1  ?  ?Recommendations for Other Services   ? ?  ?Precautions / Restrictions Precautions ?Precautions: Back ?Required Braces or Orthoses: Spinal Brace ?Spinal Brace: Lumbar corset;Applied in sitting position ?Restrictions ?Weight Bearing Restrictions: No  ? ? ?  ? ?Mobility Bed Mobility ?  ?Bed Mobility: Supine to Sit, Sit to Supine ?  ?  ?Supine to sit: Supervision ?Sit to  supine: Supervision ?  ?General bed mobility comments: frequent vcs for log roll technique- trialed 2 attempts to ensure approrpiate body mechanics with good carry over in between attempts ?  ? ?Transfers ?Overall transfer level: Needs assistance ?Equipment used: Rolling walker (2 wheels) ?Transfers: Sit to/from Stand ?Sit to Stand: Supervision ?  ?  ?  ?  ?  ?General transfer comment: vcs for technique ?  ?  ?Balance Overall balance assessment: Needs assistance ?Sitting-balance support: Feet supported ?Sitting balance-Leahy Scale: Good ?  ?  ?Standing balance support: Bilateral upper extremity supported, During functional activity ?Standing balance-Leahy Scale: Good ?  ?  ?  ?  ?  ?  ?  ?  ?  ?  ?  ?  ?   ? ?ADL either performed or assessed with clinical judgement  ? ?ADL Overall ADL's : Needs assistance/impaired ?  ?  ?Grooming: Wash/dry hands;Wash/dry face;Standing;Supervision/safety ?  ?  ?  ?Lower Body Bathing: Set up;Sitting/lateral leans ?Lower Body Bathing Details (indicate cue type and reason): brace application with intermittent vcs ?  ?  ?  ?  ?Toilet Transfer: Supervision/safety;Rolling walker (2 wheels);Ambulation ?  ?  ?  ?  ?  ?Functional mobility during ADLs: Supervision/safety;Rolling walker (2 wheels) ?  ?  ? ?Extremity/Trunk Assessment   ?  ?  ?  ?  ?  ? ?Vision   ?  ?  ?Perception   ?  ?Praxis   ?  ? ?Cognition Arousal/Alertness: Awake/alert ?Behavior During Therapy: [facility] for tasks assessed/performed ?Overall Cognitive Status: Within Functional Limits for tasks assessed ?  ?  ?  ?  ?  ?  ?  ?  ?  ?  ?  ?  ?  ?  ?  ?  ?  ?  ?  ?   ?  Exercises   ? ?  ?Shoulder Instructions   ? ? ?  ?General Comments    ? ? ?Pertinent Vitals/ Pain       Pain Assessment ?Pain Score: 7  ?Pain Location: lower back ?Pain Descriptors / Indicators: Discomfort ?Pain Intervention(s): Limited activity within patient's tolerance, Patient requesting pain meds-RN notified, Monitored during session, Repositioned ? ?Home Living   ?   ?  ?  ?  ?  ?  ?  ?  ?  ?  ?  ?  ?  ?  ?  ?  ?  ?  ? ?  ?Prior Functioning/Environment    ?  ?  ?  ?   ? ?Frequency ? Min 2X/week  ? ? ? ? ?  ?Progress Toward Goals ? ?OT Goals(current goals can now be found in the care plan section) ? Progress towards OT goals: Progressing toward goals ? ?Acute Rehab OT Goals ?Patient Stated Goal: go home ?OT Goal Formulation: With patient/family ?Time For Goal Achievement: 06/09/21 ?Potential to Achieve Goals: Good  ?Plan Discharge plan remains appropriate   ? ?Co-evaluation ? ? ?   ?  ?  ?  ?  ? ?  ?AM-PAC OT "6 Clicks" Daily Activity     ?Outcome Measure ? ? Help from another person eating meals?: None ?Help from another person taking care of personal grooming?: None ?Help from another person toileting, which includes using toliet, bedpan, or urinal?: None ?Help from another person bathing (including washing, rinsing, drying)?: None ?Help from another person to put on and taking off regular upper body clothing?: None ?Help from another person to put on and taking off regular lower body clothing?: A Lot ?6 Click Score: 22 ? ?  ?End of Session Equipment Utilized During Treatment: Rolling walker (2 wheels) ? ?OT Visit Diagnosis: Unsteadiness on feet (R26.81) ?  ?Activity Tolerance Patient tolerated treatment well ?  ?Patient Left in bed;with call bell/phone within reach;with bed alarm set ?  ?Nurse Communication Mobility status;Patient requests pain meds ?  ? ?   ? ?Time: 8329-1916 ?OT Time Calculation (min): 12 min ? ?Charges: OT General Charges ?$OT Visit: 1 Visit ?OT Treatments ?$Self Care/Home Management : 8-22 mins ? ?Shanon Payor, OTD OTR/L  ?05/26/21, 3:45 PM  ?

## 2021-05-26 NOTE — Discharge Instructions (Addendum)
NEUROSURGERY DISCHARGE INSTRUCTIONS ? ?Admission diagnosis: S/P lumbar fusion [Z98.1] ? ?Operative procedure: L5-S1 ALIF ? ?What to do after you leave the hospital: ? ?Recommended diet: regular diet. Increase protein intake to promote wound healing. ? ?Recommended activity: no lifting, driving, or strenuous exercise for 4 weeks . You should walk multiple times per day ? ?Special Instructions ? ?No straining, no heavy lifting > 10lbs x 4 weeks.  Keep incision area clean and dry. May shower in 2 days. No baths or pools for 6 weeks.  ?Please remove dressing tomorrow, no need to apply a bandage afterwards ? ?You have no sutures to remove, the skin is closed with adhesive ? ?Please take pain medications as directed. Take a stool softener if on pain medications ? ? ?Please Report any of the following: ?Nausea or Vomiting, Temperature is greater than 101.38F (38.1C) degrees, Dizziness, Abdominal Pain, Difficulty Breathing or Shortness of Breath, Inability to Eat, drink Fluids, or Take medications, Bleeding, swelling, or drainage from surgical incision sites, New numbness or weakness, and Bowel or bladder dysfunction to the neurosurgeon on call at [phone] ? ?Additional Follow up appointments ?Please follow up with Cooper Render PA-C in [place] clinic as scheduled in 2-3 weeks ? ? ?Please see below for scheduled appointments: ? ?No future appointments. ? ?  ?

## 2021-05-26 NOTE — Progress Notes (Signed)
? ?   Attending Progress Note ? ?History: Brent [name]. S/p L5-S1 ALIF  ? ?POD2: back pain this morning. Awaiting a bowel movement ? ?POD1: Patient reports he is passing gas overnight but has yet to have a bowel movement.  His Foley was removed this morning and he has yet to urinate independently.  He is eating without any significant abdominal pain.  He reports improvement in his buttock pain. ? ?Physical Exam: ?Vitals:  ? 05/25/21 2112 05/26/21 0347  ?BP: (!) 186/83 (!) 147/70  ?Pulse: 94 76  ?Resp: 20 20  ?Temp: 99.7 ?F (37.6 ?C) 99.6 ?F (37.6 ?C)  ?SpO2: 95% 94%  ? ? ?Drowsy but arouses to voice. Oriented x 3 ?CNI ? ?Strength:5/5 throughout BLE  ?Incisions c/d/I with post-op bandages in place.  ? ?Data: ? ?Recent Labs  ?Lab 05/25/21 ?1194  ?CREATININE 1.22  ? ? ?No results for input(s): AST, ALT, ALKPHOS in the last 168 hours. ? ?Invalid input(s): TBILI  ? Recent Labs  ?Lab 05/25/21 ?1740  ?WBC 23.7*  ?HGB 11.5*  ?HCT 33.4*  ?PLT 347  ? ? ?No results for input(s): APTT, INR in the last 168 hours.  ?   ? ? ?Other tests/results: none  ? ?Assessment/Plan: ? ?Brent [name]. is a 76 year old with grade 2 spondylolisthesis status post L5-S1 ALIF on 05/24/2021. ? ?- mobilize ?- pain control ?- DVT prophylaxis ?- PTOT; has HH established with [facility] at discharge  ? ?Cooper Render PA-C ?Department of Neurosurgery ? ?  ?

## 2021-05-26 NOTE — Discharge Summary (Signed)
Physician Discharge Summary  ?Patient ID: ?Brent [name]. ?MRN: [id number] ?DOB/AGE: [birth year] 74 y.o. ? ?Admit date: 05/24/2021 ?Discharge date: 05/26/2021 ? 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 ?Discharge Diagnoses:  ?Principal Problem: ?  S/P lumbar fusion ? ? ?Discharged Condition: good ? ?Hospital Course:  ?Brent [name] is a 76 y/o s/p L5-S1 ALIF. His interoperative course was uncomplicated and he was admitted for pain control and monitoring. He was seen by PT and deemed appropriate for discharge home. He was able to have a bowel movement on POD2 and was discharged home with prescriptions for Oxycodone, Zanaflex, and Senna.  ? ?Consults: None ? ?Significant Diagnostic Studies: NONE  ? ?Treatments: surgery: as above. See separately dictated operative report for further details  ? ?Discharge Exam: ?Blood pressure (!) 152/68, pulse 67, temperature 98.3 ?F (36.8 ?C), resp. rate 18, height '5\' 8"'$  (1.727 m), weight 109.6 kg, SpO2 98 %. ? ?Oriented x 3 ?CNI ?Strength:5/5 throughout BLE  ?Incisions c/d/I with post-op bandages in place.  ? ?Disposition: Discharge disposition: 06-Home-Health Care Svc ? ? ? ? ? ? ?Discharge Instructions   ? ? Incentive spirometry RT   Complete by: As directed ?  ? Increase activity slowly   Complete by: As directed ?  ? Remove dressing in 24 hours   Complete by: As directed ?  ? ?  ? ?Allergies as of 05/26/2021   ?No Known Allergies ?  ? ?  ?Medication List  ?  ? ?STOP taking these medications   ? ?acetaminophen 325 MG tablet ?Commonly known as: TYLENOL ?  ?aspirin EC 81 MG tablet ?  ?ibuprofen 200 MG tablet ?Commonly known as: ADVIL ?  ?naproxen sodium 220 MG tablet ?Commonly known as: ALEVE ?  ? ?  ? ?TAKE these medications   ? ?atorvastatin 40 MG tablet ?Commonly known as: LIPITOR ?Take 40 mg by mouth at bedtime. ?  ?Fish Oil 1000 MG Caps ?Take 1,000 mg by mouth in the morning and at bedtime. ?  ?gabapentin 300 MG capsule ?Commonly  known as: NEURONTIN ?Take 300 mg by mouth at bedtime. ?  ?lisinopril 40 MG tablet ?Commonly known as: ZESTRIL ?Take 40 mg by mouth every morning. ?  ?Mens 50+ Multi Vitamin/Min Tabs ?Take 1 tablet by mouth daily. ?  ?metFORMIN 500 MG tablet ?Commonly known as: GLUCOPHAGE ?Take 500-1,000 mg by mouth See admin instructions. Take 1000 mg by mouth in the morning and 500 mg at night ?  ?multivitamin tablet ?Take 2 tablets by mouth daily. Cinnamon ?  ?multivitamin with minerals Tabs tablet ?Take 1 tablet by mouth daily. ?  ?Oxycodone HCl 10 MG Tabs ?Take 1 tablet (10 mg total) by mouth every 4 (four) hours as needed for up to 5 days for severe pain ((score 7 to 10)). ?  ?polyvinyl alcohol 1.4 % ophthalmic solution ?Commonly known as: LIQUIFILM TEARS ?Place 1 drop into both eyes as needed for dry eyes. ?  ?senna 8.6 MG Tabs tablet ?Commonly known as: SENOKOT ?Take 1 tablet (8.6 mg total) by mouth 2 (two) times daily. ?  ?sildenafil 100 MG tablet ?Commonly known as: VIAGRA ?Take 100 mg by mouth at bedtime as needed for erectile dysfunction. ?  ?tiZANidine 4 MG tablet ?Commonly known as: ZANAFLEX ?Take 1 tablet (4 mg total) by mouth every 8 (eight) hours as needed for muscle spasms. ?  ? ?  ? ? Follow-up Information   ? ? Loleta Dicker, PA Follow up  in 2 week(s).   ?Why: For incision check. This appointment date and time should be included in your pre-op paperwork ?[place] information: ?[place] [address] ?[phone] ? ? ?  ?  ? ?  ?  ? ?  ? ? ?Signed: ?Loleta Dicker ?05/26/2021, 4:12 PM ? ? ?

## 2021-05-27 LAB — TYPE AND SCREEN
ABO/RH(D): A POS
Antibody Screen: NEGATIVE
Unit division: 0

## 2021-05-27 LAB — BPAM RBC
Blood Product Expiration Date: 202305202359
Unit Type and Rh: 6200

## 2021-05-27 LAB — PREPARE RBC (CROSSMATCH)

## 2021-05-30 ENCOUNTER — Encounter: Payer: Self-pay | Admitting: Neurosurgery

## 2021-08-23 ENCOUNTER — Other Ambulatory Visit: Payer: Self-pay

## 2021-08-23 DIAGNOSIS — Z981 Arthrodesis status: Secondary | ICD-10-CM

## 2021-08-24 ENCOUNTER — Ambulatory Visit
Admission: RE | Admit: 2021-08-24 | Discharge: 2021-08-24 | Disposition: A | Discharge status: Home / Self Care | Payer: Medicare Other | Attending: Family Medicine | Admitting: Family Medicine

## 2021-08-24 ENCOUNTER — Encounter: Payer: Self-pay | Admitting: Neurosurgery

## 2021-08-24 ENCOUNTER — Ambulatory Visit
Admission: RE | Admit: 2021-08-24 | Discharge: 2021-08-24 | Disposition: A | Discharge status: Home / Self Care | Payer: Medicare Other | Source: Ambulatory Visit | Attending: Neurosurgery | Admitting: Neurosurgery

## 2021-08-24 ENCOUNTER — Ambulatory Visit: Payer: Medicare Other | Admitting: Neurosurgery

## 2021-08-24 VITALS — BP 164/79 | HR 69 | Temp 97.9°F | Ht 68.0 in | Wt 231.0 lb

## 2021-08-24 DIAGNOSIS — Z981 Arthrodesis status: Secondary | ICD-10-CM | POA: Insufficient documentation

## 2021-08-24 NOTE — Progress Notes (Signed)
   DOS: 05/24/21 L5-S1 ALIF and PSF   HISTORY OF PRESENT ILLNESS: 08/24/2021 [name] is status post anterior lumbar interbody fusion.  His severe radicular pain is improved compared to preop.  However, he continues to have discomfort in his posterior buttocks bilaterally and in his back.  He spends most of his time sitting in his recliner.     PHYSICAL EXAMINATION:   Vitals:   08/24/21 0923  BP: (!) 164/79  Pulse: 69  Temp: 97.9 F (36.6 C)   General: Patient is well developed, well nourished, calm, collected, and in no apparent distress.  NEUROLOGICAL:  General: In no acute distress.  Awake, alert, oriented to person, place, and time. Pupils equal round and reactive to light.   Strength:  Side Iliopsoas Quads Hamstring PF DF EHL  R '5 5 5 5 5 5  '$ L '5 5 5 5 5 5   '$ Incision c/d/i   ROS (Neurologic): Negative except as noted above  IMAGING: No complications noted  ASSESSMENT/PLAN:  Brent [name] is doing reasonably well after anterior lumbar interbody fusion with improvement of his severe radicular pain.  However, he continues to have some back pain.  His x-rays are reassuring.  We discussed referral for physical therapy, which she declined.  He would like to consider injections.  I will refer him back to Dr. Alba Destine.  I will see him back in 3 months or sooner if any additional issues.  We discussed considering a CT myelogram, but will defer for now.    I spent a total of 15 minutes in face-to-face and non-face-to-face activities related to this patient's care today.   Meade Maw MD, [facility] Department of Neurosurgery

## 2021-09-20 ENCOUNTER — Ambulatory Visit: Payer: Medicare Other | Admitting: Dermatology

## 2021-10-10 ENCOUNTER — Ambulatory Visit (INDEPENDENT_AMBULATORY_CARE_PROVIDER_SITE_OTHER): Payer: Medicare Other | Admitting: Dermatology

## 2021-10-10 DIAGNOSIS — L570 Actinic keratosis: Secondary | ICD-10-CM

## 2021-10-10 DIAGNOSIS — L918 Other hypertrophic disorders of the skin: Secondary | ICD-10-CM

## 2021-10-10 DIAGNOSIS — C44229 Squamous cell carcinoma of skin of left ear and external auricular canal: Secondary | ICD-10-CM

## 2021-10-10 DIAGNOSIS — C4492 Squamous cell carcinoma of skin, unspecified: Secondary | ICD-10-CM

## 2021-10-10 DIAGNOSIS — D492 Neoplasm of unspecified behavior of bone, soft tissue, and skin: Secondary | ICD-10-CM

## 2021-10-10 HISTORY — DX: Squamous cell carcinoma of skin, unspecified: C44.92

## 2021-10-10 NOTE — Patient Instructions (Addendum)
Wound Care Instructions  Cleanse wound gently with soap and water once a day then pat dry with clean gauze. Apply a thin coat of Petrolatum (petroleum jelly, "Vaseline") over the wound (unless you have an allergy to this). We recommend that you use a new, sterile tube of Vaseline. Do not pick or remove scabs. Do not remove the yellow or white "healing tissue" from the base of the wound.  Cover the wound with fresh, clean, nonstick gauze and secure with paper tape. You may use Band-Aids in place of gauze and tape if the wound is small enough, but would recommend trimming much of the tape off as there is often too much. Sometimes Band-Aids can irritate the skin.  You should call the office for your biopsy report after 1 week if you have not already been contacted.  If you experience any problems, such as abnormal amounts of bleeding, swelling, significant bruising, significant pain, or evidence of infection, please call the office immediately.  FOR ADULT SURGERY PATIENTS: If you need something for pain relief you may take 1 extra strength Tylenol (acetaminophen) AND 2 Ibuprofen (200mg each) together every 4 hours as needed for pain. (do not take these if you are allergic to them or if you have a reason you should not take them.) Typically, you may only need pain medication for 1 to 3 days.     Cryotherapy Aftercare  Wash gently with soap and water everyday.   Apply Vaseline and Band-Aid daily until healed.    Due to recent changes in healthcare laws, you may see results of your pathology and/or laboratory studies on MyChart before the doctors have had a chance to review them. We understand that in some cases there may be results that are confusing or concerning to you. Please understand that not all results are received at the same time and often the doctors may need to interpret multiple results in order to provide you with the best plan of care or course of treatment. Therefore, we ask that you  please give us 2 business days to thoroughly review all your results before contacting the office for clarification. Should we see a critical lab result, you will be contacted sooner.   If You Need Anything After Your Visit  If you have any questions or concerns for your doctor, please call our main line at [phone] and press option 4 to reach your doctor's medical assistant. If no one answers, please leave a voicemail as directed and we will return your call as soon as possible. Messages left after 4 pm will be answered the following business day.   You may also send us a message via MyChart. We typically respond to MyChart messages within 1-2 business days.  For prescription refills, please ask your pharmacy to contact our office. Our fax number is [phone].  If you have an urgent issue when the clinic is closed that cannot wait until the next business day, you can page your doctor at the number below.    Please note that while we do our best to be available for urgent issues outside of office hours, we are not available 24/7.   If you have an urgent issue and are unable to reach us, you may choose to seek medical care at your doctor's office, retail clinic, urgent care center, or emergency room.  If you have a medical emergency, please immediately call 911 or go to the emergency department.  Pager Numbers  - Dr. Kowalski: [phone]  -   Dr. Moye: [phone]  - Dr. Stewart: [phone]  In the event of inclement weather, please call our main line at [phone] for an update on the status of any delays or closures.  Dermatology Medication Tips: Please keep the boxes that topical medications come in in order to help keep track of the instructions about where and how to use these. Pharmacies typically print the medication instructions only on the boxes and not directly on the medication tubes.   If your medication is too expensive, please contact our office at  [phone] option 4 or send us a message through MyChart.   We are unable to tell what your co-pay for medications will be in advance as this is different depending on your insurance coverage. However, we may be able to find a substitute medication at lower cost or fill out paperwork to get insurance to cover a needed medication.   If a prior authorization is required to get your medication covered by your insurance company, please allow us 1-2 business days to complete this process.  Drug prices often vary depending on where the prescription is filled and some pharmacies may offer cheaper prices.  The website www.goodrx.com contains coupons for medications through different pharmacies. The prices here do not account for what the cost may be with help from insurance (it may be cheaper with your insurance), but the website can give you the price if you did not use any insurance.  - You can print the associated coupon and take it with your prescription to the pharmacy.  - You may also stop by our office during regular business hours and pick up a GoodRx coupon card.  - If you need your prescription sent electronically to a different pharmacy, notify our office through [facility] MyChart or by phone at [phone] option 4.     Si Usted Necesita Algo Despus de Su Visita  Tambin puede enviarnos un mensaje a travs de MyChart. Por lo general respondemos a los mensajes de MyChart en el transcurso de 1 a 2 das hbiles.  Para renovar recetas, por favor pida a su farmacia que se ponga en contacto con nuestra oficina. Nuestro nmero de fax es el [phone].  Si tiene un asunto urgente cuando la clnica est cerrada y que no puede esperar hasta el siguiente da hbil, puede llamar/localizar a su doctor(a) al nmero que aparece a continuacin.   Por favor, tenga en cuenta que aunque hacemos todo lo posible para estar disponibles para asuntos urgentes fuera del horario de oficina, no estamos  disponibles las 24 horas del da, los 7 das de la semana.   Si tiene un problema urgente y no puede comunicarse con nosotros, puede optar por buscar atencin mdica  en el consultorio de su doctor(a), en una clnica privada, en un centro de atencin urgente o en una sala de emergencias.  Si tiene una emergencia mdica, por favor llame inmediatamente al 911 o vaya a la sala de emergencias.  Nmeros de bper  - Dr. Kowalski: [phone]  - Dra. Moye: [phone]  - Dra. Stewart: [phone]  En caso de inclemencias del tiempo, por favor llame a nuestra lnea principal al [phone] para una actualizacin sobre el estado de cualquier retraso o cierre.  Consejos para la medicacin en dermatologa: Por favor, guarde las cajas en las que vienen los medicamentos de uso tpico para ayudarle a seguir las instrucciones sobre dnde y cmo usarlos. Las farmacias generalmente imprimen las instrucciones del medicamento slo en las cajas y   no directamente en los tubos del medicamento.   Si su medicamento es muy caro, por favor, pngase en contacto con nuestra oficina llamando al [phone] y presione la opcin 4 o envenos un mensaje a travs de MyChart.   No podemos decirle cul ser su copago por los medicamentos por adelantado ya que esto es diferente dependiendo de la cobertura de su seguro. Sin embargo, es posible que podamos encontrar un medicamento sustituto a menor costo o llenar un formulario para que el seguro cubra el medicamento que se considera necesario.   Si se requiere una autorizacin previa para que su compaa de seguros cubra su medicamento, por favor permtanos de 1 a 2 das hbiles para completar este proceso.  Los precios de los medicamentos varan con frecuencia dependiendo del lugar de dnde se surte la receta y alguna farmacias pueden ofrecer precios ms baratos.  El sitio web www.goodrx.com tiene cupones para medicamentos de diferentes farmacias. Los precios aqu no  tienen en cuenta lo que podra costar con la ayuda del seguro (puede ser ms barato con su seguro), pero el sitio web puede darle el precio si no utiliz ningn seguro.  - Puede imprimir el cupn correspondiente y llevarlo con su receta a la farmacia.  - Tambin puede pasar por nuestra oficina durante el horario de atencin regular y recoger una tarjeta de cupones de GoodRx.  - Si necesita que su receta se enve electrnicamente a una farmacia diferente, informe a nuestra oficina a travs de MyChart de [facility] o por telfono llamando al [phone] y presione la opcin 4.  

## 2021-10-10 NOTE — Progress Notes (Signed)
Follow-Up Visit   Subjective  Brent [name]. is a 76 y.o. male who presents for the following: check spot (At left ear, present for a few years but has continued to get larger. ).  Patient accompanied by wife who contributes to history.  The following portions of the chart were reviewed this encounter and updated as appropriate:   Tobacco  Allergies  Meds  Problems  Med Hx  Surg Hx  Fam Hx      Review of Systems:  No other skin or systemic complaints except as noted in HPI or Assessment and Plan.  Objective  Well appearing patient in no apparent distress; mood and affect are within normal limits.  A focused examination was performed including face, ear, arms, neck. Relevant physical exam findings are noted in the Assessment and Plan.  left helix 1.0 cm small pink nodule with keratotic center R/o SCC      left mid cheek x 1, right forehead x 1, right lateral forehead x 1 (3) Erythematous thin papules/macules with gritty scale.   left neck x 3, right neck x 3, left axilla x 3, right axilla x 6 Fleshy, skin-colored pedunculated papules.      Assessment & Plan  Neoplasm of skin left helix  Skin / nail biopsy Type of biopsy: tangential   Informed consent: discussed and consent obtained   Timeout: patient name, date of birth, surgical site, and procedure verified   Procedure prep:  Patient was prepped and draped in usual sterile fashion Prep type:  Isopropyl alcohol Anesthesia: the lesion was anesthetized in a standard fashion   Anesthetic:  1% lidocaine w/ epinephrine 1-100,000 buffered w/ 8.4% NaHCO3 Instrument used: DermaBlade   Hemostasis achieved with: aluminum chloride   Outcome: patient tolerated procedure well   Post-procedure details: wound care instructions given   Additional details:  Petrolatum and a pressure bandage applied  Specimen 1 - Surgical pathology Differential Diagnosis: R/o SCC  Check Margins: No 1.0 cm small pink nodule with  keratotic center   AK (actinic keratosis) (3) left mid cheek x 1, right forehead x 1, right lateral forehead x 1  Actinic keratoses are precancerous spots that appear secondary to cumulative UV radiation exposure/sun exposure over time. They are chronic with expected duration over 1 year. A portion of actinic keratoses will progress to squamous cell carcinoma of the skin. It is not possible to reliably predict which spots will progress to skin cancer and so treatment is recommended to prevent development of skin cancer.  Recommend daily broad spectrum sunscreen SPF 30+ to sun-exposed areas, reapply every 2 hours as needed.  Recommend staying in the shade or wearing long sleeves, sun glasses (UVA+UVB protection) and wide brim hats (4-inch brim around the entire circumference of the hat). Call for new or changing lesions.  Prior to procedure, discussed risks of blister formation, small wound, skin dyspigmentation, or rare scar following cryotherapy. Recommend Vaseline ointment to treated areas while healing.   Destruction of lesion - left mid cheek x 1, right forehead x 1, right lateral forehead x 1  Destruction method: cryotherapy   Informed consent: discussed and consent obtained   Lesion destroyed using liquid nitrogen: Yes   Cryotherapy cycles:  2 Outcome: patient tolerated procedure well with no complications   Post-procedure details: wound care instructions given    Skin tag left neck x 3, right neck x 3, left axilla x 3, right axilla x 6  Benign-appearing.  Symptomatic per patient. Inflamed on  exam. Recommend scheduling for snip removal.   Return for first available skin tags.  Graciella Belton, RMA, am acting as scribe for Forest Gleason, MD .  Documentation: I have reviewed the above documentation for accuracy and completeness, and I agree with the above.  Forest Gleason, MD

## 2021-10-12 ENCOUNTER — Encounter: Payer: Self-pay | Admitting: Dermatology

## 2021-10-12 ENCOUNTER — Telehealth: Payer: Self-pay

## 2021-10-12 DIAGNOSIS — C4492 Squamous cell carcinoma of skin, unspecified: Secondary | ICD-10-CM

## 2021-10-12 NOTE — Telephone Encounter (Signed)
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 We discussed what I expected at his visit. Let me know if he has questions. Thank you!

## 2021-10-12 NOTE — Telephone Encounter (Signed)
Patient advised bx showed SCC, referred to [facility] for Mohs. Lurlean Horns., RMA

## 2021-10-31 ENCOUNTER — Encounter: Payer: Self-pay | Admitting: Dermatology

## 2021-10-31 ENCOUNTER — Ambulatory Visit (INDEPENDENT_AMBULATORY_CARE_PROVIDER_SITE_OTHER): Payer: Medicare Other | Admitting: Dermatology

## 2021-10-31 DIAGNOSIS — L814 Other melanin hyperpigmentation: Secondary | ICD-10-CM

## 2021-10-31 DIAGNOSIS — L820 Inflamed seborrheic keratosis: Secondary | ICD-10-CM | POA: Diagnosis not present

## 2021-10-31 DIAGNOSIS — L219 Seborrheic dermatitis, unspecified: Secondary | ICD-10-CM

## 2021-10-31 DIAGNOSIS — D1801 Hemangioma of skin and subcutaneous tissue: Secondary | ICD-10-CM | POA: Diagnosis not present

## 2021-10-31 DIAGNOSIS — L570 Actinic keratosis: Secondary | ICD-10-CM | POA: Diagnosis not present

## 2021-10-31 DIAGNOSIS — L578 Other skin changes due to chronic exposure to nonionizing radiation: Secondary | ICD-10-CM | POA: Diagnosis not present

## 2021-10-31 DIAGNOSIS — L918 Other hypertrophic disorders of the skin: Secondary | ICD-10-CM

## 2021-10-31 DIAGNOSIS — L821 Other seborrheic keratosis: Secondary | ICD-10-CM

## 2021-10-31 MED ORDER — KETOCONAZOLE 2 % EX CREA: 1.0000 | TOPICAL_CREAM | Freq: Two times a day (BID) | CUTANEOUS | 0 refills | Status: AC

## 2021-10-31 NOTE — Progress Notes (Signed)
Follow-Up Visit   Subjective  Brent [name]. is a 76 y.o. male who presents for the following: Skin Tag (Patient here today for skin tag removal at neck and axilla. ). Skin tags rub on clothing and are symptomatic. He also notes two spots at his scalp which are itchy and irritated.  Patient accompanied by wife who contributes to history.   The following portions of the chart were reviewed this encounter and updated as appropriate:   Tobacco  Allergies  Meds  Problems  Med Hx  Surg Hx  Fam Hx      Review of Systems:  No other skin or systemic complaints except as noted in HPI or Assessment and Plan.  Objective  Well appearing patient in no apparent distress; mood and affect are within normal limits.  A focused examination was performed including arms, back, neck, axillae. Relevant physical exam findings are noted in the Assessment and Plan.  Right Forearm x 2 (2) Erythematous thin papules/macules with gritty scale.   Left Temporal Scalp x 2 Erythematous stuck-on, waxy papule or plaque  Left Ala Nasi Scaly pink plaques    Assessment & Plan  AK (actinic keratosis) (2) Right Forearm x 2  Actinic keratoses are precancerous spots that appear secondary to cumulative UV radiation exposure/sun exposure over time. They are chronic with expected duration over 1 year. A portion of actinic keratoses will progress to squamous cell carcinoma of the skin. It is not possible to reliably predict which spots will progress to skin cancer and so treatment is recommended to prevent development of skin cancer.  Recommend daily broad spectrum sunscreen SPF 30+ to sun-exposed areas, reapply every 2 hours as needed.  Recommend staying in the shade or wearing long sleeves, sun glasses (UVA+UVB protection) and wide brim hats (4-inch brim around the entire circumference of the hat). Call for new or changing lesions.  Prior to procedure, discussed risks of blister formation, small wound,  skin dyspigmentation, or rare scar following cryotherapy. Recommend Vaseline ointment to treated areas while healing.    Destruction of lesion - Right Forearm x 2  Destruction method: cryotherapy   Informed consent: discussed and consent obtained   Lesion destroyed using liquid nitrogen: Yes   Cryotherapy cycles:  2 Outcome: patient tolerated procedure well with no complications   Post-procedure details: wound care instructions given    Inflamed seborrheic keratosis Left Temporal Scalp x 2  Symptomatic, irritating, patient would like treated.   Destruction of lesion - Left Temporal Scalp x 2  Destruction method: cryotherapy   Informed consent: discussed and consent obtained   Lesion destroyed using liquid nitrogen: Yes   Cryotherapy cycles:  2 Outcome: patient tolerated procedure well with no complications   Post-procedure details: wound care instructions given   Additional details:  Prior to procedure, discussed risks of blister formation, small wound, skin dyspigmentation, or rare scar following cryotherapy. Recommend Vaseline ointment to treated areas while healing.    Seborrheic dermatitis Left Ala Nasi  Chronic and persistent condition with duration or expected duration over one year. Condition is bothersome/symptomatic for patient. Currently flared.  Seborrheic Dermatitis  -  is a chronic persistent rash characterized by pinkness and scaling most commonly of the mid face but also can occur on the scalp (dandruff), ears; mid chest, mid back and groin.  It tends to be exacerbated by stress and cooler weather.  People who have neurologic disease may experience new onset or exacerbation of existing seborrheic dermatitis.  The condition  is not curable but treatable and can be controlled.  Start ketoconazole cream twice a day as needed    Acrochordons (Skin Tags) - Symptomatic and inflamed. Removal desired by patient - Fleshy, skin-colored pedunculated papules at left neck  x 3, right neck x 3, left axilla x 3, right axilla x 6 - Benign appearing.  - Patient desires removal. Reviewed that this is not covered by insurance and they will be charged a cosmetic fee for removal. Patient signed non-covered consent.  - Prior to the procedure, reviewed the expected small wound. Also reviewed the risk of leaving a small scar and the small risk of infection.  PROCEDURE - The areas were prepped with isopropyl alcohol. A small amount of lidocaine 1% with epinephrine was injected at the base of each lesion to achieve good local anesthesia. The skin tags were removed using a snip technique. Aluminum chloride was used for hemostasis. Petrolatum and a bandage were applied. The procedure was tolerated well. - Wound care was reviewed with the patient. They were advised to call with any concerns. Total number of treated acrochordons 15  Seborrheic Keratoses - Stuck-on, waxy, tan-brown papules and/or plaques  - Benign-appearing - Discussed benign etiology and prognosis. - Observe - Call for any changes  Hemangiomas - Red papules - Discussed benign nature - Observe - Call for any changes  Lentigines - Scattered tan macules - Due to sun exposure - Benign-appearing, observe - Recommend daily broad spectrum sunscreen SPF 30+ to sun-exposed areas, reapply every 2 hours as needed. - Call for any changes  Dermatofibroma - Firm pink/brown papulenodule with dimple sign at left upper arm - Benign appearing - Call for any changes   Return for TBSE, as scheduled.  Graciella Belton, RMA, am acting as scribe for Forest Gleason, MD .  Documentation: I have reviewed the above documentation for accuracy and completeness, and I agree with the above.  Forest Gleason, MD

## 2021-10-31 NOTE — Patient Instructions (Addendum)
Cryotherapy Aftercare  Wash gently with soap and water everyday.   Apply Vaseline and Band-Aid daily until healed.     Due to recent changes in healthcare laws, you may see results of your pathology and/or laboratory studies on MyChart before the doctors have had a chance to review them. We understand that in some cases there may be results that are confusing or concerning to you. Please understand that not all results are received at the same time and often the doctors may need to interpret multiple results in order to provide you with the best plan of care or course of treatment. Therefore, we ask that you please give us 2 business days to thoroughly review all your results before contacting the office for clarification. Should we see a critical lab result, you will be contacted sooner.   If You Need Anything After Your Visit  If you have any questions or concerns for your doctor, please call our main line at [phone] and press option 4 to reach your doctor's medical assistant. If no one answers, please leave a voicemail as directed and we will return your call as soon as possible. Messages left after 4 pm will be answered the following business day.   You may also send us a message via MyChart. We typically respond to MyChart messages within 1-2 business days.  For prescription refills, please ask your pharmacy to contact our office. Our fax number is [phone].  If you have an urgent issue when the clinic is closed that cannot wait until the next business day, you can page your doctor at the number below.    Please note that while we do our best to be available for urgent issues outside of office hours, we are not available 24/7.   If you have an urgent issue and are unable to reach us, you may choose to seek medical care at your doctor's office, retail clinic, urgent care center, or emergency room.  If you have a medical emergency, please immediately call 911 or go to the  emergency department.  Pager Numbers  - Dr. Kowalski: [phone]  - Dr. Moye: [phone]  - Dr. Stewart: [phone]  In the event of inclement weather, please call our main line at [phone] for an update on the status of any delays or closures.  Dermatology Medication Tips: Please keep the boxes that topical medications come in in order to help keep track of the instructions about where and how to use these. Pharmacies typically print the medication instructions only on the boxes and not directly on the medication tubes.   If your medication is too expensive, please contact our office at [phone] option 4 or send us a message through MyChart.   We are unable to tell what your co-pay for medications will be in advance as this is different depending on your insurance coverage. However, we may be able to find a substitute medication at lower cost or fill out paperwork to get insurance to cover a needed medication.   If a prior authorization is required to get your medication covered by your insurance company, please allow us 1-2 business days to complete this process.  Drug prices often vary depending on where the prescription is filled and some pharmacies may offer cheaper prices.  The website www.goodrx.com contains coupons for medications through different pharmacies. The prices here do not account for what the cost may be with help from insurance (it may be cheaper with your insurance), but the website can   give you the price if you did not use any insurance.  - You can print the associated coupon and take it with your prescription to the pharmacy.  - You may also stop by our office during regular business hours and pick up a GoodRx coupon card.  - If you need your prescription sent electronically to a different pharmacy, notify our office through [facility] MyChart or by phone at [phone] option 4.     Si Usted Necesita Algo Despus de Su Visita  Tambin puede  enviarnos un mensaje a travs de MyChart. Por lo general respondemos a los mensajes de MyChart en el transcurso de 1 a 2 das hbiles.  Para renovar recetas, por favor pida a su farmacia que se ponga en contacto con nuestra oficina. Nuestro nmero de fax es el [phone].  Si tiene un asunto urgente cuando la clnica est cerrada y que no puede esperar hasta el siguiente da hbil, puede llamar/localizar a su doctor(a) al nmero que aparece a continuacin.   Por favor, tenga en cuenta que aunque hacemos todo lo posible para estar disponibles para asuntos urgentes fuera del horario de oficina, no estamos disponibles las 24 horas del da, los 7 das de la semana.   Si tiene un problema urgente y no puede comunicarse con nosotros, puede optar por buscar atencin mdica  en el consultorio de su doctor(a), en una clnica privada, en un centro de atencin urgente o en una sala de emergencias.  Si tiene una emergencia mdica, por favor llame inmediatamente al 911 o vaya a la sala de emergencias.  Nmeros de bper  - Dr. Kowalski: [phone]  - Dra. Moye: [phone]  - Dra. Stewart: [phone]  En caso de inclemencias del tiempo, por favor llame a nuestra lnea principal al [phone] para una actualizacin sobre el estado de cualquier retraso o cierre.  Consejos para la medicacin en dermatologa: Por favor, guarde las cajas en las que vienen los medicamentos de uso tpico para ayudarle a seguir las instrucciones sobre dnde y cmo usarlos. Las farmacias generalmente imprimen las instrucciones del medicamento slo en las cajas y no directamente en los tubos del medicamento.   Si su medicamento es muy caro, por favor, pngase en contacto con nuestra oficina llamando al [phone] y presione la opcin 4 o envenos un mensaje a travs de MyChart.   No podemos decirle cul ser su copago por los medicamentos por adelantado ya que esto es diferente dependiendo de la cobertura de su seguro.  Sin embargo, es posible que podamos encontrar un medicamento sustituto a menor costo o llenar un formulario para que el seguro cubra el medicamento que se considera necesario.   Si se requiere una autorizacin previa para que su compaa de seguros cubra su medicamento, por favor permtanos de 1 a 2 das hbiles para completar este proceso.  Los precios de los medicamentos varan con frecuencia dependiendo del lugar de dnde se surte la receta y alguna farmacias pueden ofrecer precios ms baratos.  El sitio web www.goodrx.com tiene cupones para medicamentos de diferentes farmacias. Los precios aqu no tienen en cuenta lo que podra costar con la ayuda del seguro (puede ser ms barato con su seguro), pero el sitio web puede darle el precio si no utiliz ningn seguro.  - Puede imprimir el cupn correspondiente y llevarlo con su receta a la farmacia.  - Tambin puede pasar por nuestra oficina durante el horario de atencin regular y recoger una tarjeta de cupones de GoodRx.  -   Si necesita que su receta se enve electrnicamente a una farmacia diferente, informe a nuestra oficina a travs de MyChart de [facility] o por telfono llamando al [phone] y presione la opcin 4.  

## 2021-11-05 HISTORY — PX: OTHER SURGICAL HISTORY: SHX169

## 2021-11-22 ENCOUNTER — Telehealth: Payer: Self-pay

## 2021-11-22 NOTE — Telephone Encounter (Signed)
Encounter notes scanned in from [facility] for your review. 

## 2021-11-27 ENCOUNTER — Other Ambulatory Visit: Payer: Self-pay

## 2021-11-27 DIAGNOSIS — Z981 Arthrodesis status: Secondary | ICD-10-CM

## 2021-11-28 ENCOUNTER — Encounter: Payer: Self-pay | Admitting: Neurosurgery

## 2021-11-28 ENCOUNTER — Ambulatory Visit
Admission: RE | Admit: 2021-11-28 | Discharge: 2021-11-28 | Disposition: A | Discharge status: Home / Self Care | Payer: Medicare Other | Source: Ambulatory Visit | Attending: Neurosurgery | Admitting: Neurosurgery

## 2021-11-28 ENCOUNTER — Ambulatory Visit (INDEPENDENT_AMBULATORY_CARE_PROVIDER_SITE_OTHER): Payer: Medicare Other | Admitting: Neurosurgery

## 2021-11-28 VITALS — BP 171/83 | HR 65 | Ht 68.0 in | Wt 234.6 lb

## 2021-11-28 DIAGNOSIS — Z09 Encounter for follow-up examination after completed treatment for conditions other than malignant neoplasm: Secondary | ICD-10-CM | POA: Diagnosis not present

## 2021-11-28 DIAGNOSIS — Z981 Arthrodesis status: Secondary | ICD-10-CM

## 2021-11-28 DIAGNOSIS — M533 Sacrococcygeal disorders, not elsewhere classified: Secondary | ICD-10-CM | POA: Diagnosis not present

## 2021-11-28 DIAGNOSIS — M549 Dorsalgia, unspecified: Secondary | ICD-10-CM

## 2021-11-28 NOTE — Progress Notes (Signed)
   DOS: 05/24/21 L5-S1 ALIF and PSF   HISTORY OF PRESENT ILLNESS: 11/28/2021 [name] is doing reasonably well.  He had an SI joint injection on both sides in August which caused greater than 50% relief.  He has substantial left knee osteoarthritis for which she is considering a knee replacement.  08/24/21 [name] is status post anterior lumbar interbody fusion.  His severe radicular pain is improved compared to preop.  However, he continues to have discomfort in his posterior buttocks bilaterally and in his back.  He spends most of his time sitting in his recliner.     PHYSICAL EXAMINATION:   Vitals:   11/28/21 1014  BP: (!) 171/83  Pulse: 65   General: Patient is well developed, well nourished, calm, collected, and in no apparent distress.  NEUROLOGICAL:  General: In no acute distress.  Awake, alert, oriented to person, place, and time. Pupils equal round and reactive to light.   Strength:  Side Iliopsoas Quads Hamstring PF DF EHL  R '5 5 5 5 5 5  '$ L '5 5 5 5 5 5   '$ Incision c/d/i   ROS (Neurologic): Negative except as noted above  IMAGING: No complications noted  ASSESSMENT/PLAN:  Brent [name] is doing reasonably well after anterior lumbar interbody fusion with improvement of his severe radicular pain.  However, he continues to have some back pain.  He had a good response to bilateral sacroiliac joint injections.  I would like to send him for confirmatory injections with Dr. Alba Destine.  He will then consider sacroiliac joint fusion.  I did let him know that I do not perform that procedure and would refer him to [facility] for this.  He is likely to pursue his left knee arthroplasty in the spring.  He may do that before consideration of his sacroiliac joint fusion.    I will see him back in 6 months with x-rays.    I spent a total of 15 minutes in face-to-face and non-face-to-face activities related to this patient's care today.   Meade Maw MD, [facility] Department of  Neurosurgery

## 2021-12-11 NOTE — Telephone Encounter (Signed)
Reviewed. MAs please confirm that patient is scheduled for FBSE and mark off in the biopsy book. Thank you!  

## 2021-12-20 DIAGNOSIS — R413 Other amnesia: Secondary | ICD-10-CM | POA: Insufficient documentation

## 2022-01-08 ENCOUNTER — Telehealth: Payer: Self-pay

## 2022-01-08 NOTE — Telephone Encounter (Signed)
Updated specimen tracking and history.  MOHs completed 11/22/2021

## 2022-01-21 DIAGNOSIS — M1712 Unilateral primary osteoarthritis, left knee: Secondary | ICD-10-CM | POA: Insufficient documentation

## 2022-02-08 ENCOUNTER — Ambulatory Visit (INDEPENDENT_AMBULATORY_CARE_PROVIDER_SITE_OTHER): Payer: Medicare Other | Admitting: Dermatology

## 2022-02-08 DIAGNOSIS — L820 Inflamed seborrheic keratosis: Secondary | ICD-10-CM | POA: Diagnosis not present

## 2022-02-08 DIAGNOSIS — Z1283 Encounter for screening for malignant neoplasm of skin: Secondary | ICD-10-CM

## 2022-02-08 DIAGNOSIS — D229 Melanocytic nevi, unspecified: Secondary | ICD-10-CM

## 2022-02-08 DIAGNOSIS — L570 Actinic keratosis: Secondary | ICD-10-CM | POA: Diagnosis not present

## 2022-02-08 DIAGNOSIS — Z85828 Personal history of other malignant neoplasm of skin: Secondary | ICD-10-CM

## 2022-02-08 DIAGNOSIS — L821 Other seborrheic keratosis: Secondary | ICD-10-CM | POA: Diagnosis not present

## 2022-02-08 DIAGNOSIS — L814 Other melanin hyperpigmentation: Secondary | ICD-10-CM | POA: Diagnosis not present

## 2022-02-08 DIAGNOSIS — L578 Other skin changes due to chronic exposure to nonionizing radiation: Secondary | ICD-10-CM

## 2022-02-08 DIAGNOSIS — L918 Other hypertrophic disorders of the skin: Secondary | ICD-10-CM | POA: Diagnosis not present

## 2022-02-08 DIAGNOSIS — Z8589 Personal history of malignant neoplasm of other organs and systems: Secondary | ICD-10-CM

## 2022-02-08 NOTE — Progress Notes (Signed)
Follow-Up Visit   Subjective  Brent [name]. is a 77 y.o. male who presents for the following: Annual Exam (Tbse, hx of isks, reports spots at face and groin. Hx of scc). The patient presents for Total-Body Skin Exam (TBSE) for skin cancer screening and mole check.  The patient has spots, moles and lesions to be evaluated, some may be new or changing and the patient has concerns that these could be cancer.  The following portions of the chart were reviewed this encounter and updated as appropriate:  Tobacco  Allergies  Meds  Problems  Med Hx  Surg Hx  Fam Hx     Review of Systems: No other skin or systemic complaints except as noted in HPI or Assessment and Plan.  Objective  Well appearing patient in no apparent distress; mood and affect are within normal limits.  A full examination was performed including scalp, head, eyes, ears, nose, lips, neck, chest, axillae, abdomen, back, buttocks, bilateral upper extremities, bilateral lower extremities, hands, feet, fingers, toes, fingernails, and toenails. All findings within normal limits unless otherwise noted below.  face x 8 left forearm x 2, groin x 8 (18) Erythematous stuck-on, waxy papule or plaque  right sideburn x 1 Erythematous thin papules/macules with gritty scale.   left groin x 1 Fleshy, skin-colored pedunculated papules   Assessment & Plan  Inflamed seborrheic keratosis (18) face x 8 left forearm x 2, groin x 8  Symptomatic, irritating, patient would like treated.  Destruction of lesion - face x 8 left forearm x 2, groin x 8 Complexity: simple   Destruction method: cryotherapy   Informed consent: discussed and consent obtained   Timeout:  patient name, date of birth, surgical site, and procedure verified Lesion destroyed using liquid nitrogen: Yes   Region frozen until ice ball extended beyond lesion: Yes   Outcome: patient tolerated procedure well with no complications   Post-procedure details: wound  care instructions given   Additional details:  Prior to procedure, discussed risks of blister formation, small wound, skin dyspigmentation, or rare scar following cryotherapy. Recommend Vaseline ointment to treated areas while healing.   Actinic keratosis right sideburn x 1  Actinic keratoses are precancerous spots that appear secondary to cumulative UV radiation exposure/sun exposure over time. They are chronic with expected duration over 1 year. A portion of actinic keratoses will progress to squamous cell carcinoma of the skin. It is not possible to reliably predict which spots will progress to skin cancer and so treatment is recommended to prevent development of skin cancer.  Recommend daily broad spectrum sunscreen SPF 30+ to sun-exposed areas, reapply every 2 hours as needed.  Recommend staying in the shade or wearing long sleeves, sun glasses (UVA+UVB protection) and wide brim hats (4-inch brim around the entire circumference of the hat). Call for new or changing lesions.  Destruction of lesion - right sideburn x 1 Complexity: simple   Destruction method: cryotherapy   Informed consent: discussed and consent obtained   Timeout:  patient name, date of birth, surgical site, and procedure verified Lesion destroyed using liquid nitrogen: Yes   Region frozen until ice ball extended beyond lesion: Yes   Outcome: patient tolerated procedure well with no complications   Post-procedure details: wound care instructions given    Acrochordon left groin x 1  Patient reports irritated and bothers   Destruction of lesion - left groin x 1 Complexity: simple   Destruction method: cryotherapy   Informed consent: discussed and consent  obtained   Timeout:  patient name, date of birth, surgical site, and procedure verified Lesion destroyed using liquid nitrogen: Yes   Region frozen until ice ball extended beyond lesion: Yes   Outcome: patient tolerated procedure well with no complications    Post-procedure details: wound care instructions given    Lentigines - Scattered tan macules - Due to sun exposure - Benign-appearing, observe - Recommend daily broad spectrum sunscreen SPF 30+ to sun-exposed areas, reapply every 2 hours as needed. - Call for any changes  Seborrheic Keratoses - Stuck-on, waxy, tan-brown papules and/or plaques  - Benign-appearing - Discussed benign etiology and prognosis. - Observe - Call for any changes  Acrochordons (Skin Tags) - Fleshy, skin-colored pedunculated papules - Benign appearing.  - Observe. - If desired, they can be removed with an in office procedure that is not covered by insurance. - Please call the clinic if you notice any new or changing lesions.  Melanocytic Nevi - Tan-brown and/or pink-flesh-colored symmetric macules and papules - Benign appearing on exam today - Observation - Call clinic for new or changing moles - Recommend daily use of broad spectrum spf 30+ sunscreen to sun-exposed areas.   Hemangiomas - Red papules - Discussed benign nature - Observe - Call for any changes  Actinic Damage - Chronic condition, secondary to cumulative UV/sun exposure - diffuse scaly erythematous macules with underlying dyspigmentation - Recommend daily broad spectrum sunscreen SPF 30+ to sun-exposed areas, reapply every 2 hours as needed.  - Staying in the shade or wearing long sleeves, sun glasses (UVA+UVB protection) and wide brim hats (4-inch brim around the entire circumference of the hat) are also recommended for sun protection.  - Call for new or changing lesions.  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  - Call if any new or changing lesions are noted between office visits  Skin cancer  screening performed today. Return in about 1 year (around 02/09/2023) for TBSE. IRuthell Rummage, CMA, am acting as scribe for Sarina Ser, MD. Documentation: I have reviewed the above documentation for accuracy and completeness, and I agree with the above.  Sarina Ser, MD

## 2022-02-08 NOTE — Patient Instructions (Addendum)
Actinic keratoses are precancerous spots that appear secondary to cumulative UV radiation exposure/sun exposure over time. They are chronic with expected duration over 1 year. A portion of actinic keratoses will progress to squamous cell carcinoma of the skin. It is not possible to reliably predict which spots will progress to skin cancer and so treatment is recommended to prevent development of skin cancer.  Recommend daily broad spectrum sunscreen SPF 30+ to sun-exposed areas, reapply every 2 hours as needed.  Recommend staying in the shade or wearing long sleeves, sun glasses (UVA+UVB protection) and wide brim hats (4-inch brim around the entire circumference of the hat). Call for new or changing lesions.   Cryotherapy Aftercare  Wash gently with soap and water everyday.   Apply Vaseline and Band-Aid daily until healed.    Seborrheic Keratosis  What causes seborrheic keratoses? Seborrheic keratoses are harmless, common skin growths that first appear during adult life.  As time goes by, more growths appear.  Some people may develop a large number of them.  Seborrheic keratoses appear on both covered and uncovered body parts.  They are not caused by sunlight.  The tendency to develop seborrheic keratoses can be inherited.  They vary in color from skin-colored to gray, brown, or even black.  They can be either smooth or have a rough, warty surface.   Seborrheic keratoses are superficial and look as if they were stuck on the skin.  Under the microscope this type of keratosis looks like layers upon layers of skin.  That is why at times the top layer may seem to fall off, but the rest of the growth remains and re-grows.    Treatment Seborrheic keratoses do not need to be treated, but can easily be removed in the office.  Seborrheic keratoses often cause symptoms when they rub on clothing or jewelry.  Lesions can be in the way of shaving.  If they become inflamed, they can cause itching, soreness, or  burning.  Removal of a seborrheic keratosis can be accomplished by freezing, burning, or surgery. If any spot bleeds, scabs, or grows rapidly, please return to have it checked, as these can be an indication of a skin cancer.  Melanoma ABCDEs  Melanoma is the most dangerous type of skin cancer, and is the leading cause of death from skin disease.  You are more likely to develop melanoma if you: Have light-colored skin, light-colored eyes, or red or blond hair Spend a lot of time in the sun Tan regularly, either outdoors or in a tanning bed Have had blistering sunburns, especially during childhood Have a close family member who has had a melanoma Have atypical moles or large birthmarks  Early detection of melanoma is key since treatment is typically straightforward and cure rates are extremely high if we catch it early.   The first sign of melanoma is often a change in a mole or a new dark spot.  The ABCDE system is a way of remembering the signs of melanoma.  A for asymmetry:  The two halves do not match. B for border:  The edges of the growth are irregular. C for color:  A mixture of colors are present instead of an even brown color. D for diameter:  Melanomas are usually (but not always) greater than 6mm - the size of a pencil eraser. E for evolution:  The spot keeps changing in size, shape, and color.  Please check your skin once per month between visits. You can use a small   mirror in front and a large mirror behind you to keep an eye on the back side or your body.   If you see any new or changing lesions before your next follow-up, please call to schedule a visit.  Please continue daily skin protection including broad spectrum sunscreen SPF 30+ to sun-exposed areas, reapplying every 2 hours as needed when you're outdoors.   Staying in the shade or wearing long sleeves, sun glasses (UVA+UVB protection) and wide brim hats (4-inch brim around the entire circumference of the hat) are also  recommended for sun protection.    Due to recent changes in healthcare laws, you may see results of your pathology and/or laboratory studies on MyChart before the doctors have had a chance to review them. We understand that in some cases there may be results that are confusing or concerning to you. Please understand that not all results are received at the same time and often the doctors may need to interpret multiple results in order to provide you with the best plan of care or course of treatment. Therefore, we ask that you please give us 2 business days to thoroughly review all your results before contacting the office for clarification. Should we see a critical lab result, you will be contacted sooner.   If You Need Anything After Your Visit  If you have any questions or concerns for your doctor, please call our main line at [phone] and press option 4 to reach your doctor's medical assistant. If no one answers, please leave a voicemail as directed and we will return your call as soon as possible. Messages left after 4 pm will be answered the following business day.   You may also send us a message via MyChart. We typically respond to MyChart messages within 1-2 business days.  For prescription refills, please ask your pharmacy to contact our office. Our fax number is [phone].  If you have an urgent issue when the clinic is closed that cannot wait until the next business day, you can page your doctor at the number below.    Please note that while we do our best to be available for urgent issues outside of office hours, we are not available 24/7.   If you have an urgent issue and are unable to reach us, you may choose to seek medical care at your doctor's office, retail clinic, urgent care center, or emergency room.  If you have a medical emergency, please immediately call 911 or go to the emergency department.  Pager Numbers  - Dr. Kowalski: [phone]  - Dr. Moye:  [phone]  - Dr. Stewart: [phone]  In the event of inclement weather, please call our main line at [phone] for an update on the status of any delays or closures.  Dermatology Medication Tips: Please keep the boxes that topical medications come in in order to help keep track of the instructions about where and how to use these. Pharmacies typically print the medication instructions only on the boxes and not directly on the medication tubes.   If your medication is too expensive, please contact our office at [phone] option 4 or send us a message through MyChart.   We are unable to tell what your co-pay for medications will be in advance as this is different depending on your insurance coverage. However, we may be able to find a substitute medication at lower cost or fill out paperwork to get insurance to cover a needed medication.   If a prior authorization   is required to get your medication covered by your insurance company, please allow us 1-2 business days to complete this process.  Drug prices often vary depending on where the prescription is filled and some pharmacies may offer cheaper prices.  The website www.goodrx.com contains coupons for medications through different pharmacies. The prices here do not account for what the cost may be with help from insurance (it may be cheaper with your insurance), but the website can give you the price if you did not use any insurance.  - You can print the associated coupon and take it with your prescription to the pharmacy.  - You may also stop by our office during regular business hours and pick up a GoodRx coupon card.  - If you need your prescription sent electronically to a different pharmacy, notify our office through [facility] MyChart or by phone at [phone] option 4.     Si Usted Necesita Algo Despus de Su Visita  Tambin puede enviarnos un mensaje a travs de MyChart. Por lo general respondemos a los mensajes de  MyChart en el transcurso de 1 a 2 das hbiles.  Para renovar recetas, por favor pida a su farmacia que se ponga en contacto con nuestra oficina. Nuestro nmero de fax es el [phone].  Si tiene un asunto urgente cuando la clnica est cerrada y que no puede esperar hasta el siguiente da hbil, puede llamar/localizar a su doctor(a) al nmero que aparece a continuacin.   Por favor, tenga en cuenta que aunque hacemos todo lo posible para estar disponibles para asuntos urgentes fuera del horario de oficina, no estamos disponibles las 24 horas del da, los 7 das de la semana.   Si tiene un problema urgente y no puede comunicarse con nosotros, puede optar por buscar atencin mdica  en el consultorio de su doctor(a), en una clnica privada, en un centro de atencin urgente o en una sala de emergencias.  Si tiene una emergencia mdica, por favor llame inmediatamente al 911 o vaya a la sala de emergencias.  Nmeros de bper  - Dr. Kowalski: [phone]  - Dra. Moye: [phone]  - Dra. Stewart: [phone]  En caso de inclemencias del tiempo, por favor llame a nuestra lnea principal al [phone] para una actualizacin sobre el estado de cualquier retraso o cierre.  Consejos para la medicacin en dermatologa: Por favor, guarde las cajas en las que vienen los medicamentos de uso tpico para ayudarle a seguir las instrucciones sobre dnde y cmo usarlos. Las farmacias generalmente imprimen las instrucciones del medicamento slo en las cajas y no directamente en los tubos del medicamento.   Si su medicamento es muy caro, por favor, pngase en contacto con nuestra oficina llamando al [phone] y presione la opcin 4 o envenos un mensaje a travs de MyChart.   No podemos decirle cul ser su copago por los medicamentos por adelantado ya que esto es diferente dependiendo de la cobertura de su seguro. Sin embargo, es posible que podamos encontrar un medicamento sustituto a menor costo o  llenar un formulario para que el seguro cubra el medicamento que se considera necesario.   Si se requiere una autorizacin previa para que su compaa de seguros cubra su medicamento, por favor permtanos de 1 a 2 das hbiles para completar este proceso.  Los precios de los medicamentos varan con frecuencia dependiendo del lugar de dnde se surte la receta y alguna farmacias pueden ofrecer precios ms baratos.  El sitio web www.goodrx.com tiene cupones para medicamentos   de diferentes farmacias. Los precios aqu no tienen en cuenta lo que podra costar con la ayuda del seguro (puede ser ms barato con su seguro), pero el sitio web puede darle el precio si no utiliz ningn seguro.  - Puede imprimir el cupn correspondiente y llevarlo con su receta a la farmacia.  - Tambin puede pasar por nuestra oficina durante el horario de atencin regular y recoger una tarjeta de cupones de GoodRx.  - Si necesita que su receta se enve electrnicamente a una farmacia diferente, informe a nuestra oficina a travs de MyChart de [facility] o por telfono llamando al [phone] y presione la opcin 4.  

## 2022-02-10 ENCOUNTER — Encounter: Payer: Self-pay | Admitting: Dermatology

## 2022-04-05 NOTE — Discharge Instructions (Addendum)
Instructions after Total Knee Replacement   [name], [name], M.D.     Dept. of Orthopaedics & Sports Medicine  [facility]  [address]  [place], [place]  [address]  Phone: [phone]   Fax: [phone]    DIET: Drink plenty of non-alcoholic fluids. Resume your normal diet. Include foods high in fiber.  ACTIVITY:  You may use crutches or a walker with weight-bearing as tolerated, unless instructed otherwise. You may be weaned off of the walker or crutches by your Physical Therapist.  Do NOT place pillows under the knee. Anything placed under the knee could limit your ability to straighten the knee.   Continue doing gentle exercises. Exercising will reduce the pain and swelling, increase motion, and prevent muscle weakness.   Please continue to use the TED compression stockings for 6 weeks. You may remove the stockings at night, but should reapply them in the morning. Do not drive or operate any equipment until instructed.  WOUND CARE:  Continue to use the PolarCare or ice packs periodically to reduce pain and swelling. You may bathe or shower after the staples are removed at the first office visit following surgery.  MEDICATIONS: You may resume your regular medications. Please take the pain medication as prescribed on the medication. Do not take pain medication on an empty stomach. You have been given a prescription for a blood thinner (Lovenox or Coumadin). Please take the medication as instructed. (NOTE: After completing a 2 week course of Lovenox, take one Enteric-coated aspirin twice a day. This along with elevation will help reduce the possibility of phlebitis in your operated leg.) Do not drive or drink alcoholic beverages when taking pain medications.  CALL THE OFFICE FOR: Temperature above 101 degrees Excessive bleeding or drainage on the dressing. Excessive swelling, coldness, or paleness of the toes. Persistent nausea and vomiting.  FOLLOW-UP:   You should have an appointment to return to the office in 10-14 days after surgery. Arrangements have been made for continuation of Physical Therapy (either home therapy or outpatient therapy).     [facility] Department Directory         [email or web address]       [email or web address]          Cardiology  Appointments: [place] - [phone] [place] - [phone]  Endocrinology  Appointments: [place] - [phone] [place] - [phone]  Gastroenterology  Appointments: [place] - [phone] [place] - [phone]        General Surgery   Appointments: [place] - [phone]  Internal Medicine/Family Medicine  Appointments: [place] - [phone] [place] - [phone] [place] - [phone]  Metabolic and Weigh Loss Surgery  Appointments: [place] - [phone]        Neurology  Appointments: [place] - [phone] [place] - [phone]  Neurosurgery  Appointments: [place] - [phone]  Obstetrics & Gynecology  Appointments: [place] - [phone] [place] - [phone]        Pediatrics  Appointments: [place] - [phone] [place] - [phone]  Physiatry  Appointments: [place] -[phone]  Physical Therapy  Appointments: [place] - [phone] [place] - [phone]        Podiatry  Appointments: [place] - [phone] [place] - [phone]  Pulmonology  Appointments: [place] - [phone]  Rheumatology  Appointments: [place] - [phone]        [place] Location: [facility]  [address] , [place]  [address]  [place] Location: [facility] [address] [place], [place]  [address]  [place] Location: [facility] [address] [place], [place]  [address]    

## 2022-04-06 ENCOUNTER — Encounter
Admission: RE | Admit: 2022-04-06 | Discharge: 2022-04-06 | Disposition: A | Discharge status: Home / Self Care | Payer: Medicare Other | Source: Ambulatory Visit | Attending: Orthopedic Surgery | Admitting: Orthopedic Surgery

## 2022-04-06 ENCOUNTER — Other Ambulatory Visit: Payer: Self-pay

## 2022-04-06 VITALS — BP 142/69 | Resp 16 | Ht 68.0 in | Wt 238.3 lb

## 2022-04-06 DIAGNOSIS — Z01818 Encounter for other preprocedural examination: Secondary | ICD-10-CM | POA: Diagnosis present

## 2022-04-06 DIAGNOSIS — I440 Atrioventricular block, first degree: Secondary | ICD-10-CM | POA: Insufficient documentation

## 2022-04-06 DIAGNOSIS — M1712 Unilateral primary osteoarthritis, left knee: Secondary | ICD-10-CM

## 2022-04-06 DIAGNOSIS — I452 Bifascicular block: Secondary | ICD-10-CM | POA: Diagnosis not present

## 2022-04-06 DIAGNOSIS — Z01812 Encounter for preprocedural laboratory examination: Secondary | ICD-10-CM

## 2022-04-06 DIAGNOSIS — E1140 Type 2 diabetes mellitus with diabetic neuropathy, unspecified: Secondary | ICD-10-CM | POA: Insufficient documentation

## 2022-04-06 DIAGNOSIS — Z862 Personal history of diseases of the blood and blood-forming organs and certain disorders involving the immune mechanism: Secondary | ICD-10-CM | POA: Diagnosis not present

## 2022-04-06 DIAGNOSIS — R748 Abnormal levels of other serum enzymes: Secondary | ICD-10-CM | POA: Insufficient documentation

## 2022-04-06 DIAGNOSIS — I4510 Unspecified right bundle-branch block: Secondary | ICD-10-CM | POA: Diagnosis not present

## 2022-04-06 DIAGNOSIS — I4891 Unspecified atrial fibrillation: Secondary | ICD-10-CM | POA: Diagnosis not present

## 2022-04-06 HISTORY — DX: Pneumonia, unspecified organism: J18.9

## 2022-04-06 LAB — CBC
HCT: 44.7 % (ref 39.0–52.0)
Hemoglobin: 15.4 g/dL (ref 13.0–17.0)
MCH: 30.1 pg (ref 26.0–34.0)
MCHC: 34.5 g/dL (ref 30.0–36.0)
MCV: 87.5 fL (ref 80.0–100.0)
Platelets: 483 10*3/uL — ABNORMAL HIGH (ref 150–400)
RBC: 5.11 MIL/uL (ref 4.22–5.81)
RDW: 13.9 % (ref 11.5–15.5)
WBC: 16.7 10*3/uL — ABNORMAL HIGH (ref 4.0–10.5)
nRBC: 0 % (ref 0.0–0.2)

## 2022-04-06 LAB — URINALYSIS, ROUTINE W REFLEX MICROSCOPIC
Bacteria, UA: NONE SEEN
Bilirubin Urine: NEGATIVE
Glucose, UA: 50 mg/dL — AB
Hgb urine dipstick: NEGATIVE
Ketones, ur: NEGATIVE mg/dL
Leukocytes,Ua: NEGATIVE
Nitrite: NEGATIVE
Protein, ur: 100 mg/dL — AB
Specific Gravity, Urine: 1.021 (ref 1.005–1.030)
Squamous Epithelial / HPF: NONE SEEN /HPF (ref 0–5)
pH: 7 (ref 5.0–8.0)

## 2022-04-06 LAB — COMPREHENSIVE METABOLIC PANEL
ALT: 22 U/L (ref 0–44)
AST: 32 U/L (ref 15–41)
Albumin: 4.1 g/dL (ref 3.5–5.0)
Alkaline Phosphatase: 80 U/L (ref 38–126)
Anion gap: 13 (ref 5–15)
BUN: 17 mg/dL (ref 8–23)
CO2: 24 mmol/L (ref 22–32)
Calcium: 9.5 mg/dL (ref 8.9–10.3)
Chloride: 101 mmol/L (ref 98–111)
Creatinine, Ser: 1.05 mg/dL (ref 0.61–1.24)
GFR, Estimated: >60 mL/min (ref >60)
Glucose, Bld: 142 mg/dL — ABNORMAL HIGH (ref 70–99)
Potassium: 3.9 mmol/L (ref 3.5–5.1)
Sodium: 138 mmol/L (ref 135–145)
Total Bilirubin: 0.9 mg/dL (ref 0.3–1.2)
Total Protein: 8 g/dL (ref 6.5–8.1)

## 2022-04-06 LAB — TYPE AND SCREEN
ABO/RH(D): A POS
Antibody Screen: NEGATIVE

## 2022-04-06 LAB — C-REACTIVE PROTEIN: CRP: 1 mg/dL — ABNORMAL HIGH (ref <1.0)

## 2022-04-06 LAB — SURGICAL PCR SCREEN
MRSA, PCR: NEGATIVE
Staphylococcus aureus: NEGATIVE

## 2022-04-06 LAB — SEDIMENTATION RATE: Sed Rate: 18 mm/hr (ref 0–20)

## 2022-04-06 NOTE — Patient Instructions (Addendum)
Your procedure is scheduled on: 04/16/22 - Monday Report to the Registration Desk on the 1st floor of the [facility]. To find out your arrival time, please call [phone] between 1PM - 3PM on: 04/13/22 - Friday If your arrival time is 6:00 am, do not arrive before that time as the [place] entrance doors do not open until 6:00 am.  REMEMBER: Instructions that are not followed completely may result in serious medical risk, up to and including death; or upon the discretion of your surgeon and anesthesiologist your surgery may need to be rescheduled.  Do not eat food after midnight the night before surgery.  No gum chewing or hard candies.  You may however, drink CLEAR liquids up to 2 hours before you are scheduled to arrive for your surgery. Do not drink anything within 2 hours of your scheduled arrival time.  Clear liquids include: - water   Type 1 and Type 2 diabetics should only drink water.  In addition, your doctor has ordered for you to drink the provided:  Gatorade G2 Drinking this carbohydrate drink up to two hours before surgery helps to reduce insulin resistance and improve patient outcomes. Please complete drinking 2 hours before scheduled arrival time.  One week prior to surgery: Stop beginning 04/09/22, Anti-inflammatories (NSAIDS) such as Advil, Aleve, Ibuprofen, Motrin, Naproxen, Naprosyn and Aspirin based products such as Excedrin, Goody's Powder, BC Powder.  Stop beginning 04/09/22, ANY OVER THE COUNTER supplements until after surgery.Multiple Vitamins-Minerals ,Omega-3 Fatty Acids (FISH OIL) .CINNAMON .  You may take Tylenol if needed for pain up until the day of surgery.  Continue taking all prescribed medications with the exception of the following:  metFORMIN (GLUCOPHAGE) hold beginning 04/14/22.  TAKE ONLY THESE MEDICATIONS THE MORNING OF SURGERY WITH A SIP OF WATER:  NONE   No Alcohol for 24 hours before or after surgery.  No Smoking including  e-cigarettes for 24 hours before surgery.  No chewable tobacco products for at least 6 hours before surgery.  No nicotine patches on the day of surgery.  Do not use any "recreational" drugs for at least a week (preferably 2 weeks) before your surgery.  Please be advised that the combination of cocaine and anesthesia may have negative outcomes, up to and including death. If you test positive for cocaine, your surgery will be cancelled.  On the morning of surgery brush your teeth with toothpaste and water, you may rinse your mouth with mouthwash if you wish. Do not swallow any toothpaste or mouthwash.  Use CHG Soap or wipes as directed on instruction sheet.  Do not wear jewelry, make-up, hairpins, clips or nail polish.  Do not wear lotions, powders, or perfumes.   Do not shave body hair from the neck down 48 hours before surgery.  Contact lenses, hearing aids and dentures may not be worn into surgery.  Do not bring valuables to the hospital. [facility] is not responsible for any missing/lost belongings or valuables.   Notify your doctor if there is any change in your medical condition (cold, fever, infection).  Wear comfortable clothing (specific to your surgery type) to the hospital.  After surgery, you can help prevent lung complications by doing breathing exercises.  Take deep breaths and cough every 1-2 hours. Your doctor may order a device called an Incentive Spirometer to help you take deep breaths. When coughing or sneezing, hold a pillow firmly against your incision with both hands. This is called "splinting." Doing this helps protect your incision.  It also decreases belly discomfort.  If you are being admitted to the hospital overnight, leave your suitcase in the car. After surgery it may be brought to your room.  In case of increased patient census, it may be necessary for you, the patient, to continue your postoperative care in the Same Day Surgery department.  If you are  being discharged the day of surgery, you will not be allowed to drive home. You will need a responsible individual to drive you home and stay with you for 24 hours after surgery.   If you are taking public transportation, you will need to have a responsible individual with you.  Please call the [facility] at [phone] if you have any questions about these instructions.  Surgery Visitation Policy:  Patients undergoing a surgery or procedure may have two family members or support persons with them as long as the person is not COVID-19 positive or experiencing its symptoms.   Inpatient Visitation:    Visiting hours are 7 a.m. to 8 p.m. Up to four visitors are allowed at one time in a patient room. The visitors may rotate out with other people during the day. One designated support person (adult) may remain overnight.  Due to an increase in RSV and influenza rates and associated hospitalizations, children ages 24 and under will not be able to visit patients in [facility]. Masks continue to be strongly recommended.     Preparing for Surgery with CHLORHEXIDINE GLUCONATE (CHG) Soap  Chlorhexidine Gluconate (CHG) Soap  o An antiseptic cleaner that kills germs and bonds with the skin to continue killing germs even after washing  o Used for showering the night before surgery and morning of surgery  Before surgery, you can play an important role by reducing the number of germs on your skin.  CHG (Chlorhexidine gluconate) soap is an antiseptic cleanser which kills germs and bonds with the skin to continue killing germs even after washing.  Please do not use if you have an allergy to CHG or antibacterial soaps. If your skin becomes reddened/irritated stop using the CHG.  1. Shower the NIGHT BEFORE SURGERY and the MORNING OF SURGERY with CHG soap.  2. If you choose to wash your hair, wash your hair first as usual with your normal shampoo.  3. After  shampooing, rinse your hair and body thoroughly to remove the shampoo.  4. Use CHG as you would any other liquid soap. You can apply CHG directly to the skin and wash gently with a scrungie or a clean washcloth.  5. Apply the CHG soap to your body only from the neck down. Do not use on open wounds or open sores. Avoid contact with your eyes, ears, mouth, and genitals (private parts). Wash face and genitals (private parts) with your normal soap.  6. Wash thoroughly, paying special attention to the area where your surgery will be performed.  7. Thoroughly rinse your body with warm water.  8. Do not shower/wash with your normal soap after using and rinsing off the CHG soap.  9. Pat yourself dry with a clean towel.  10. Wear clean pajamas to bed the night before surgery.  12. Place clean sheets on your bed the night of your first shower and do not sleep with pets.  13. Shower again with the CHG soap on the day of surgery prior to arriving at the hospital.  14. Do not apply any deodorants/lotions/powders.  15. Please wear clean clothes to the  hospital.  How to Use an Incentive Spirometer  An incentive spirometer is a tool that measures how well you are filling your lungs with each breath. Learning to take long, deep breaths using this tool can help you keep your lungs clear and active. This may help to reverse or lessen your chance of developing breathing (pulmonary) problems, especially infection. You may be asked to use a spirometer: After a surgery. If you have a lung problem or a history of smoking. After a long period of time when you have been unable to move or be active. If the spirometer includes an indicator to show the highest number that you have reached, your health care provider or respiratory therapist will help you set a goal. Keep a log of your progress as told by your health care provider. What are the risks? Breathing too quickly may cause dizziness or cause you to pass  out. Take your time so you do not get dizzy or light-headed. If you are in pain, you may need to take pain medicine before doing incentive spirometry. It is harder to take a deep breath if you are having pain.  How to use your incentive spirometer  Sit up on the edge of your bed or on a chair. Hold the incentive spirometer so that it is in an upright position. Before you use the spirometer, breathe out normally. Place the mouthpiece in your mouth. Make sure your lips are closed tightly around it. Breathe in slowly and as deeply as you can through your mouth, causing the piston or the ball to rise toward the top of the chamber. Hold your breath for 3-5 seconds, or for as long as possible. If the spirometer includes a coach indicator, use this to guide you in breathing. Slow down your breathing if the indicator goes above the marked areas. Remove the mouthpiece from your mouth and breathe out normally. The piston or ball will return to the bottom of the chamber. Rest for a few seconds, then repeat the steps 10 or more times. Take your time and take a few normal breaths between deep breaths so that you do not get dizzy or light-headed. Do this every 1-2 hours when you are awake. If the spirometer includes a goal marker to show the highest number you have reached (best effort), use this as a goal to work toward during each repetition. After each set of 10 deep breaths, cough a few times. This will help to make sure that your lungs are clear. If you have an incision on your chest or abdomen from surgery, place a pillow or a rolled-up towel firmly against the incision when you cough. This can help to reduce pain while taking deep breaths and coughing. General tips When you are able to get out of bed: Walk around often. Continue to take deep breaths and cough in order to clear your lungs. Keep using the incentive spirometer until your health care provider says it is okay to stop using it. If you have  been in the hospital, you may be told to keep using the spirometer at home. Contact a health care provider if: You are having difficulty using the spirometer. You have trouble using the spirometer as often as instructed. Your pain medicine is not giving enough relief for you to use the spirometer as told. You have a fever. Get help right away if: You develop shortness of breath. You develop a cough with bloody mucus from the lungs. You have fluid or  blood coming from an incision site after you cough. Summary An incentive spirometer is a tool that can help you learn to take long, deep breaths to keep your lungs clear and active. You may be asked to use a spirometer after a surgery, if you have a lung problem or a history of smoking, or if you have been inactive for a long period of time. Use your incentive spirometer as instructed every 1-2 hours while you are awake. If you have an incision on your chest or abdomen, place a pillow or a rolled-up towel firmly against your incision when you cough. This will help to reduce pain. Get help right away if you have shortness of breath, you cough up bloody mucus, or blood comes from your incision when you cough. This information is not intended to replace advice given to you by your health care provider. Make sure you discuss any questions you have with your health care provider.

## 2022-04-07 LAB — HEMOGLOBIN A1C
Hgb A1c MFr Bld: 7.4 % — ABNORMAL HIGH (ref 4.8–5.6)
Mean Plasma Glucose: 166 mg/dL

## 2022-04-15 ENCOUNTER — Encounter: Payer: Self-pay | Admitting: Orthopedic Surgery

## 2022-04-15 NOTE — H&P (Signed)
ORTHOPAEDIC HISTORY & PHYSICAL Regino Bellow, PA - 04/11/2022 8:45 AM EST Formatting of this note is different from the original. Images from the original note were not included. Chief Complaint Chief Complaint Patient presents with Knee Pain H & P LEFT KNEE  Reason for Visit Brent [name]. is a 77 y.o. who presents today for a history and physical. He is to undergo a left total knee arthroplasty on 04/16/2022. Since his last visit here to clinic there is been no improvement in his condition. The patient presents his desire to proceed with surgery.  He has a long history of left knee pain. He localizes most of the pain along the medial aspect of the knee. He reports some swelling, no locking, and some giving way of the knee. The pain is aggravated by any weight bearing. The knee pain limits the patient's ability to ambulate long distances. The patient has not appreciated any significant improvement despite Ttlenol, intraarticular corticosteroid injections, viscosupplementation, activity modification, and ambulatory aids. He is using a cane for ambulation. The patient states that the knee pain has progressed to the point that it is significantly interfering with his activities of daily living.  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 Dorthy Cooler @ [place] - [place] 64[place] [facility] Dr.Menz COLONOSCOPY 08/05/2017 Tubular Adenoma: CBF 08/2020 CHOLECYSTECTOMY 07/07/2018 Dr EReeves Forth[place]intron UMBILICAL HERNIA REPAIR [id number] ELesli Albee---  Debridment of abd wall Right carpal tunnel release 01/06/2020 Dr HMarry GuanCOLONOSCOPY 04/19/2021 Tubular adenomas/PHx CP/No repeat due to age/TKT L5-S1 anterior lumbar interbody fusion and posterior spinal fusion 05/24/2021 Dr CMeade Maw Dr DLucky Cowboy Dr SDelana Meyer [place] SPLENECTOMY  Past Family History Family History Problem Relation Age of Onset No Known Problems Mother Diabetes type II Father Kidney disease Father Heart disease Father Colon polyps Father No Known Problems Sister No Known Problems Sister  Medications Current Outpatient Medications Ordered in Epic Medication Sig Dispense Refill aspirin 81 MG EC tablet Take 81 mg by mouth once daily atorvastatin (LIPITOR) 40 MG tablet Take 1 tablet by mouth once daily 90 tablet 1 calcium carbonate 300 mg (750 mg) chewable tablet Take 600 mg of elemental by mouth as needed for Heartburn cinnamon bark (CINNAMON) 500 mg capsule Take 2 capsules by mouth once daily gabapentin (NEURONTIN) 300 MG capsule Take 1 capsule by mouth at bedtime 90 capsule 0 lancets (MICROLET LANCET) as directed. Check CBG's once daily. Dx: E11.40 50 each 11 lisinopriL (ZESTRIL) 40 MG tablet Take 1 tablet by mouth once daily 90 tablet 0 melatonin 10 mg Tab Take 1 tablet by mouth once daily metFORMIN (GLUCOPHAGE) 500 MG tablet TAKE 2 TABLETS BY MOUTH IN THE MORNING AND 1 IN THE EVENING WITH MEALS 270 tablet 0 multivitamin tablet Take 1 tablet by mouth once daily naproxen sodium (ALEVE) 220 MG tablet Take 220 mg by mouth once daily as needed for Pain omega 3-dha-epa-fish oil 1,000 mg (120 mg-180 mg) Cap Take 2 capsules by mouth once daily sildenafiL (VIAGRA) 100 MG tablet TAKE 1 TABLET BY MOUTH ONCE DAILY AS NEEDED FOR ERECTILE DYSFUNCTION 10 tablet 0  No current Epic-ordered facility-administered medications on file.  Allergies No Known Allergies  Review of  Systems A comprehensive 14 point ROS was performed, reviewed, and the pertinent orthopaedic  findings are documented in the HPI.  Exam BP (!) 140/90 (BP Location: Left upper arm, Patient Position: Sitting, BP Cuff Size: Large Adult)  Ht 172.7 cm ('5\' 8"'$ )  Wt (!) 106.8 kg (235 lb 6.4 oz)  BMI 35.79 kg/m  General: Well-developed well-nourished male seen in no acute distress.  HEENT: Atraumatic,normocephalic. Pupils are equal and reactive to light. Oropharynx is clear with moist mucosa  Lungs: Clear to auscultation bilaterally  Cardiovascular: Regular rate and rhythm. Normal S1, S2. No murmurs. No appreciable gallops or rubs. Peripheral pulses are palpable.  Abdomen: Soft, non-tender, nondistended. Bowel sounds present  Extremity: Left Knee: Soft tissue swelling: minimal Effusion: none Erythema: none Crepitance: mild Tenderness: medial Alignment: relative varus Mediolateral laxity: medial pseudolaxity Posterior sag: negative Patellar tracking: Good tracking without evidence of subluxation or tilt Atrophy: No significant atrophy. Quadriceps tone was good. Range of motion: 0/0/123 degrees  Neurological:  The patient is alert and oriented Sensation to light touch appears to be intact and within normal limits Gross motor strength appeared to be equal to 5/5  Vascular :  Peripheral pulses felt to be palpable. Capillary refill appears to be intact and within normal limits  X-ray  AP standing, lateral and sunrise view of the left knee taken in [facility] on 01/09/2022 showed narrowing of the medial cartilage space with bone-on-bone articulation being observed. Is noted to have increased varus alignment. Osteophytes as well as subchondral porosis is noted. Patella tracking well. There is no evidence of any fractures or dislocation.  Impression  1. Degenerative arthrosis left knee  Plan  1. I have gone over the patient's medication list on today's visit. He is to stop his metformin 2 days prior to surgery. Currently taking no anticoagulation medication. 2.  Past medical history reviewed 3. Return to clinic 2 weeks postop. Sooner if any problems 4. We did discuss postop rehab course  This note was generated in part with voice recognition software and I apologize for any typographical errors that were not detected and corrected   Watt Climes PA Electronically signed by Regino Bellow, PA at 04/11/2022 12:01 PM EST

## 2022-04-16 ENCOUNTER — Observation Stay
Admission: RE | Admit: 2022-04-16 | Discharge: 2022-04-17 | Disposition: A | Discharge status: Home Health | Payer: Medicare Other | Attending: Orthopedic Surgery | Admitting: Orthopedic Surgery

## 2022-04-16 ENCOUNTER — Observation Stay: Payer: Medicare Other

## 2022-04-16 ENCOUNTER — Encounter
Admission: RE | Disposition: A | Discharge status: Home Health | Payer: Self-pay | Source: Home / Self Care | Attending: Orthopedic Surgery

## 2022-04-16 ENCOUNTER — Ambulatory Visit: Payer: Medicare Other | Admitting: Certified Registered Nurse Anesthetist

## 2022-04-16 ENCOUNTER — Encounter: Payer: Self-pay | Admitting: Orthopedic Surgery

## 2022-04-16 ENCOUNTER — Other Ambulatory Visit: Payer: Self-pay

## 2022-04-16 ENCOUNTER — Ambulatory Visit: Payer: Medicare Other | Admitting: Urgent Care

## 2022-04-16 DIAGNOSIS — Z7984 Long term (current) use of oral hypoglycemic drugs: Secondary | ICD-10-CM | POA: Diagnosis not present

## 2022-04-16 DIAGNOSIS — Z79899 Other long term (current) drug therapy: Secondary | ICD-10-CM | POA: Diagnosis not present

## 2022-04-16 DIAGNOSIS — M1712 Unilateral primary osteoarthritis, left knee: Secondary | ICD-10-CM | POA: Diagnosis present

## 2022-04-16 DIAGNOSIS — R748 Abnormal levels of other serum enzymes: Secondary | ICD-10-CM

## 2022-04-16 DIAGNOSIS — Z7982 Long term (current) use of aspirin: Secondary | ICD-10-CM | POA: Diagnosis not present

## 2022-04-16 DIAGNOSIS — Z01812 Encounter for preprocedural laboratory examination: Secondary | ICD-10-CM

## 2022-04-16 DIAGNOSIS — Z862 Personal history of diseases of the blood and blood-forming organs and certain disorders involving the immune mechanism: Secondary | ICD-10-CM

## 2022-04-16 DIAGNOSIS — Z96659 Presence of unspecified artificial knee joint: Secondary | ICD-10-CM

## 2022-04-16 DIAGNOSIS — E119 Type 2 diabetes mellitus without complications: Secondary | ICD-10-CM | POA: Insufficient documentation

## 2022-04-16 DIAGNOSIS — E1140 Type 2 diabetes mellitus with diabetic neuropathy, unspecified: Secondary | ICD-10-CM

## 2022-04-16 HISTORY — PX: KNEE ARTHROPLASTY: SHX992

## 2022-04-16 LAB — GLUCOSE, CAPILLARY
Glucose-Capillary: 226 mg/dL — ABNORMAL HIGH (ref 70–99)
Glucose-Capillary: 218 mg/dL — ABNORMAL HIGH (ref 70–99)
Glucose-Capillary: 242 mg/dL — ABNORMAL HIGH (ref 70–99)
Glucose-Capillary: 344 mg/dL — ABNORMAL HIGH (ref 70–99)
Glucose-Capillary: 297 mg/dL — ABNORMAL HIGH (ref 70–99)
Glucose-Capillary: 283 mg/dL — ABNORMAL HIGH (ref 70–99)

## 2022-04-16 SURGERY — ARTHROPLASTY, KNEE, TOTAL, USING IMAGELESS COMPUTER-ASSISTED NAVIGATION
Anesthesia: General | Site: Knee | Laterality: Left

## 2022-04-16 MED ORDER — ACETAMINOPHEN 325 MG PO TABS: 325.0000 mg | ORAL_TABLET | Freq: Four times a day (QID) | ORAL | Status: DC | PRN

## 2022-04-16 MED ORDER — TRAMADOL HCL 50 MG PO TABS: 50.0000 mg | ORAL_TABLET | ORAL | Status: DC | PRN

## 2022-04-16 MED ORDER — DIPHENHYDRAMINE HCL 12.5 MG/5ML PO ELIX: 12.5000 mg | ORAL_SOLUTION | ORAL | Status: DC | PRN

## 2022-04-16 MED ORDER — BUPIVACAINE HCL (PF) 0.5 % IJ SOLN
INTRAMUSCULAR | Status: DC | PRN
  Administered 2022-04-16: 2.8 mL

## 2022-04-16 MED ORDER — METFORMIN HCL 500 MG PO TABS
1000.0000 mg | ORAL_TABLET | Freq: Every day | ORAL | Status: DC
  Administered 2022-04-17: 1000 mg via ORAL
  Filled 2022-04-16: qty 2

## 2022-04-16 MED ORDER — BUPIVACAINE HCL (PF) 0.25 % IJ SOLN
INTRAMUSCULAR | Status: DC | PRN
  Administered 2022-04-16: 60 mL

## 2022-04-16 MED ORDER — OXYCODONE HCL 5 MG PO TABS: 10.0000 mg | ORAL_TABLET | ORAL | Status: DC | PRN

## 2022-04-16 MED ORDER — INSULIN ASPART 100 UNIT/ML IJ SOLN
0.0000 [IU] | Freq: Every day | INTRAMUSCULAR | Status: DC
  Administered 2022-04-16: 3 [IU] via SUBCUTANEOUS
  Filled 2022-04-16: qty 1

## 2022-04-16 MED ORDER — TRANEXAMIC ACID-NACL 1000-0.7 MG/100ML-% IV SOLN
INTRAVENOUS | Status: AC
  Administered 2022-04-16: 1000 mg via INTRAVENOUS
  Filled 2022-04-16: qty 100

## 2022-04-16 MED ORDER — TRANEXAMIC ACID-NACL 1000-0.7 MG/100ML-% IV SOLN
1000.0000 mg | INTRAVENOUS | Status: AC
  Administered 2022-04-16: 1000 mg via INTRAVENOUS

## 2022-04-16 MED ORDER — PROPOFOL 500 MG/50ML IV EMUL
INTRAVENOUS | Status: DC | PRN
  Administered 2022-04-16: 50 ug/kg/min via INTRAVENOUS

## 2022-04-16 MED ORDER — MAGNESIUM HYDROXIDE 400 MG/5ML PO SUSP
30.0000 mL | Freq: Every day | ORAL | Status: DC
  Administered 2022-04-16: 30 mL via ORAL
  Filled 2022-04-16 (×2): qty 30

## 2022-04-16 MED ORDER — OXYCODONE HCL 5 MG PO TABS
5.0000 mg | ORAL_TABLET | Freq: Once | ORAL | Status: AC | PRN
  Administered 2022-04-16: 5 mg via ORAL

## 2022-04-16 MED ORDER — DEXAMETHASONE SODIUM PHOSPHATE 10 MG/ML IJ SOLN: 8.0000 mg | Freq: Once | INTRAMUSCULAR | Status: AC

## 2022-04-16 MED ORDER — FENTANYL CITRATE (PF) 100 MCG/2ML IJ SOLN: 25.0000 ug | INTRAMUSCULAR | Status: DC | PRN

## 2022-04-16 MED ORDER — SCOPOLAMINE 1 MG/3DAYS TD PT72
1.0000 | MEDICATED_PATCH | TRANSDERMAL | Status: DC
  Administered 2022-04-16: 1.5 mg via TRANSDERMAL

## 2022-04-16 MED ORDER — LACTATED RINGERS IV SOLN: INTRAVENOUS | Status: DC

## 2022-04-16 MED ORDER — IRRISEPT - 450ML BOTTLE WITH 0.05% CHG IN STERILE WATER, USP 99.95% OPTIME
TOPICAL | Status: DC | PRN
  Administered 2022-04-16: 450 mL

## 2022-04-16 MED ORDER — INSULIN ASPART 100 UNIT/ML IJ SOLN
0.0000 [IU] | Freq: Three times a day (TID) | INTRAMUSCULAR | Status: DC
  Administered 2022-04-16: 8 [IU] via SUBCUTANEOUS
  Administered 2022-04-16: 5 [IU] via SUBCUTANEOUS
  Administered 2022-04-17: 3 [IU] via SUBCUTANEOUS
  Filled 2022-04-16 (×3): qty 1

## 2022-04-16 MED ORDER — FENTANYL CITRATE (PF) 100 MCG/2ML IJ SOLN
INTRAMUSCULAR | Status: AC
  Filled 2022-04-16: qty 2

## 2022-04-16 MED ORDER — LIDOCAINE HCL (PF) 2 % IJ SOLN
INTRAMUSCULAR | Status: AC
  Filled 2022-04-16: qty 5

## 2022-04-16 MED ORDER — INSULIN ASPART 100 UNIT/ML IJ SOLN
5.0000 [IU] | Freq: Once | INTRAMUSCULAR | Status: AC
  Administered 2022-04-16: 5 [IU] via SUBCUTANEOUS

## 2022-04-16 MED ORDER — CELECOXIB 200 MG PO CAPS: 400.0000 mg | ORAL_CAPSULE | Freq: Once | ORAL | Status: AC

## 2022-04-16 MED ORDER — FLEET ENEMA 7-19 GM/118ML RE ENEM: 1.0000 | ENEMA | Freq: Once | RECTAL | Status: DC | PRN

## 2022-04-16 MED ORDER — ONDANSETRON HCL 4 MG/2ML IJ SOLN: 4.0000 mg | Freq: Four times a day (QID) | INTRAMUSCULAR | Status: DC | PRN

## 2022-04-16 MED ORDER — GABAPENTIN 300 MG PO CAPS
ORAL_CAPSULE | ORAL | Status: AC
  Administered 2022-04-16: 300 mg via ORAL
  Filled 2022-04-16: qty 1

## 2022-04-16 MED ORDER — PHENOL 1.4 % MT LIQD: 1.0000 | OROMUCOSAL | Status: DC | PRN

## 2022-04-16 MED ORDER — POLYVINYL ALCOHOL 1.4 % OP SOLN: 1.0000 [drp] | OPHTHALMIC | Status: DC | PRN

## 2022-04-16 MED ORDER — LIDOCAINE HCL (CARDIAC) PF 100 MG/5ML IV SOSY
PREFILLED_SYRINGE | INTRAVENOUS | Status: DC | PRN
  Administered 2022-04-16: 60 mg via INTRAVENOUS

## 2022-04-16 MED ORDER — CHLORHEXIDINE GLUCONATE 0.12 % MT SOLN: 15.0000 mL | Freq: Once | OROMUCOSAL | Status: AC

## 2022-04-16 MED ORDER — ACETAMINOPHEN 10 MG/ML IV SOLN
INTRAVENOUS | Status: AC
  Filled 2022-04-16: qty 100

## 2022-04-16 MED ORDER — SODIUM CHLORIDE 0.9 % IR SOLN
Status: DC | PRN
  Administered 2022-04-16: 3000 mL

## 2022-04-16 MED ORDER — PROPOFOL 1000 MG/100ML IV EMUL
INTRAVENOUS | Status: AC
  Filled 2022-04-16: qty 100

## 2022-04-16 MED ORDER — FAMOTIDINE 20 MG PO TABS
ORAL_TABLET | ORAL | Status: AC
  Administered 2022-04-16: 20 mg
  Filled 2022-04-16: qty 1

## 2022-04-16 MED ORDER — 0.9 % SODIUM CHLORIDE (POUR BTL) OPTIME
TOPICAL | Status: DC | PRN
  Administered 2022-04-16: 500 mL

## 2022-04-16 MED ORDER — ORAL CARE MOUTH RINSE: 15.0000 mL | Freq: Once | OROMUCOSAL | Status: AC

## 2022-04-16 MED ORDER — DEXAMETHASONE SODIUM PHOSPHATE 10 MG/ML IJ SOLN
INTRAMUSCULAR | Status: AC
  Administered 2022-04-16: 8 mg via INTRAVENOUS
  Filled 2022-04-16: qty 1

## 2022-04-16 MED ORDER — MENTHOL 3 MG MT LOZG: 1.0000 | LOZENGE | OROMUCOSAL | Status: DC | PRN

## 2022-04-16 MED ORDER — METFORMIN HCL 500 MG PO TABS
500.0000 mg | ORAL_TABLET | Freq: Every day | ORAL | Status: DC
  Administered 2022-04-16: 500 mg via ORAL
  Filled 2022-04-16: qty 1

## 2022-04-16 MED ORDER — ACETAMINOPHEN 10 MG/ML IV SOLN
INTRAVENOUS | Status: DC | PRN
  Administered 2022-04-16: 1000 mg via INTRAVENOUS

## 2022-04-16 MED ORDER — HYDROMORPHONE HCL 1 MG/ML IJ SOLN
0.5000 mg | INTRAMUSCULAR | Status: DC | PRN
  Administered 2022-04-16: 1 mg via INTRAVENOUS
  Filled 2022-04-16: qty 1

## 2022-04-16 MED ORDER — ONDANSETRON HCL 4 MG/2ML IJ SOLN: 4.0000 mg | Freq: Once | INTRAMUSCULAR | Status: DC | PRN

## 2022-04-16 MED ORDER — PANTOPRAZOLE SODIUM 40 MG PO TBEC
40.0000 mg | DELAYED_RELEASE_TABLET | Freq: Two times a day (BID) | ORAL | Status: DC
  Administered 2022-04-16 – 2022-04-17 (×3): 40 mg via ORAL
  Filled 2022-04-16 (×3): qty 1

## 2022-04-16 MED ORDER — SODIUM CHLORIDE 0.9 % IV SOLN
INTRAVENOUS | Status: DC | PRN
  Administered 2022-04-16: 60 mL

## 2022-04-16 MED ORDER — LISINOPRIL 20 MG PO TABS
40.0000 mg | ORAL_TABLET | Freq: Every day | ORAL | Status: DC
  Administered 2022-04-16 – 2022-04-17 (×2): 40 mg via ORAL
  Filled 2022-04-16 (×2): qty 2

## 2022-04-16 MED ORDER — ACETAMINOPHEN 10 MG/ML IV SOLN
1000.0000 mg | Freq: Four times a day (QID) | INTRAVENOUS | Status: DC
  Administered 2022-04-16 – 2022-04-17 (×3): 1000 mg via INTRAVENOUS
  Filled 2022-04-16 (×3): qty 100

## 2022-04-16 MED ORDER — ATORVASTATIN CALCIUM 20 MG PO TABS
40.0000 mg | ORAL_TABLET | Freq: Every day | ORAL | Status: DC
  Administered 2022-04-16: 40 mg via ORAL
  Filled 2022-04-16: qty 2

## 2022-04-16 MED ORDER — ALUM & MAG HYDROXIDE-SIMETH 200-200-20 MG/5ML PO SUSP: 30.0000 mL | ORAL | Status: DC | PRN

## 2022-04-16 MED ORDER — BISACODYL 10 MG RE SUPP: 10.0000 mg | Freq: Every day | RECTAL | Status: DC | PRN

## 2022-04-16 MED ORDER — CHLORHEXIDINE GLUCONATE 0.12 % MT SOLN
OROMUCOSAL | Status: AC
  Administered 2022-04-16: 15 mL via OROMUCOSAL
  Filled 2022-04-16: qty 15

## 2022-04-16 MED ORDER — FENTANYL CITRATE (PF) 100 MCG/2ML IJ SOLN
INTRAMUSCULAR | Status: DC | PRN
  Administered 2022-04-16 (×2): 50 ug via INTRAVENOUS

## 2022-04-16 MED ORDER — ACETAMINOPHEN 10 MG/ML IV SOLN: 1000.0000 mg | Freq: Once | INTRAVENOUS | Status: DC | PRN

## 2022-04-16 MED ORDER — CHLORHEXIDINE GLUCONATE 4 % EX LIQD: 60.0000 mL | Freq: Once | CUTANEOUS | Status: DC

## 2022-04-16 MED ORDER — SCOPOLAMINE 1 MG/3DAYS TD PT72
MEDICATED_PATCH | TRANSDERMAL | Status: AC
  Filled 2022-04-16: qty 1

## 2022-04-16 MED ORDER — SENNOSIDES-DOCUSATE SODIUM 8.6-50 MG PO TABS
1.0000 | ORAL_TABLET | Freq: Two times a day (BID) | ORAL | Status: DC
  Administered 2022-04-16 (×2): 1 via ORAL
  Filled 2022-04-16 (×3): qty 1

## 2022-04-16 MED ORDER — CEFAZOLIN SODIUM-DEXTROSE 2-4 GM/100ML-% IV SOLN
2.0000 g | INTRAVENOUS | Status: AC
  Administered 2022-04-16: 2 g via INTRAVENOUS

## 2022-04-16 MED ORDER — CELECOXIB 200 MG PO CAPS
ORAL_CAPSULE | ORAL | Status: AC
  Administered 2022-04-16: 400 mg via ORAL
  Filled 2022-04-16: qty 2

## 2022-04-16 MED ORDER — TRANEXAMIC ACID-NACL 1000-0.7 MG/100ML-% IV SOLN
INTRAVENOUS | Status: AC
  Filled 2022-04-16: qty 100

## 2022-04-16 MED ORDER — CEFAZOLIN SODIUM-DEXTROSE 2-4 GM/100ML-% IV SOLN
INTRAVENOUS | Status: AC
  Filled 2022-04-16: qty 100

## 2022-04-16 MED ORDER — TRANEXAMIC ACID-NACL 1000-0.7 MG/100ML-% IV SOLN: 1000.0000 mg | Freq: Once | INTRAVENOUS | Status: AC

## 2022-04-16 MED ORDER — FAMOTIDINE 20 MG PO TABS: 20.0000 mg | ORAL_TABLET | Freq: Once | ORAL | Status: AC

## 2022-04-16 MED ORDER — METOCLOPRAMIDE HCL 5 MG PO TABS
10.0000 mg | ORAL_TABLET | Freq: Three times a day (TID) | ORAL | Status: DC
  Administered 2022-04-16 – 2022-04-17 (×4): 10 mg via ORAL
  Filled 2022-04-16 (×4): qty 2

## 2022-04-16 MED ORDER — ENOXAPARIN SODIUM 30 MG/0.3ML IJ SOSY
30.0000 mg | PREFILLED_SYRINGE | Freq: Two times a day (BID) | INTRAMUSCULAR | Status: DC
  Administered 2022-04-16: 30 mg via SUBCUTANEOUS
  Filled 2022-04-16: qty 0.3

## 2022-04-16 MED ORDER — PHENYLEPHRINE HCL-NACL 20-0.9 MG/250ML-% IV SOLN
INTRAVENOUS | Status: AC
  Filled 2022-04-16: qty 250

## 2022-04-16 MED ORDER — INSULIN ASPART 100 UNIT/ML IJ SOLN
INTRAMUSCULAR | Status: AC
  Filled 2022-04-16: qty 1

## 2022-04-16 MED ORDER — OXYCODONE HCL 5 MG PO TABS
ORAL_TABLET | ORAL | Status: AC
  Filled 2022-04-16: qty 1

## 2022-04-16 MED ORDER — SODIUM CHLORIDE 0.9 % IV SOLN: INTRAVENOUS | Status: DC

## 2022-04-16 MED ORDER — OXYCODONE HCL 5 MG/5ML PO SOLN: 5.0000 mg | Freq: Once | ORAL | Status: AC | PRN

## 2022-04-16 MED ORDER — GABAPENTIN 300 MG PO CAPS: 300.0000 mg | ORAL_CAPSULE | Freq: Once | ORAL | Status: AC

## 2022-04-16 MED ORDER — OXYCODONE HCL 5 MG PO TABS: 5.0000 mg | ORAL_TABLET | ORAL | Status: DC | PRN

## 2022-04-16 MED ORDER — ONDANSETRON HCL 4 MG PO TABS: 4.0000 mg | ORAL_TABLET | Freq: Four times a day (QID) | ORAL | Status: DC | PRN

## 2022-04-16 MED ORDER — CELECOXIB 200 MG PO CAPS
200.0000 mg | ORAL_CAPSULE | Freq: Two times a day (BID) | ORAL | Status: DC
  Administered 2022-04-17: 200 mg via ORAL
  Filled 2022-04-16: qty 1

## 2022-04-16 MED ORDER — PHENYLEPHRINE HCL-NACL 20-0.9 MG/250ML-% IV SOLN
INTRAVENOUS | Status: DC | PRN
  Administered 2022-04-16: 25 ug/min via INTRAVENOUS

## 2022-04-16 MED ORDER — CEFAZOLIN SODIUM-DEXTROSE 2-4 GM/100ML-% IV SOLN
2.0000 g | Freq: Four times a day (QID) | INTRAVENOUS | Status: AC
  Administered 2022-04-16 (×2): 2 g via INTRAVENOUS
  Filled 2022-04-16 (×2): qty 100

## 2022-04-16 MED ORDER — PROPOFOL 10 MG/ML IV BOLUS
INTRAVENOUS | Status: DC | PRN
  Administered 2022-04-16: 10 mg via INTRAVENOUS
  Administered 2022-04-16: 30 mg via INTRAVENOUS

## 2022-04-16 SURGICAL SUPPLY — 73 items
ATTUNE MED DOME PAT 41 KNEE (Knees) IMPLANT
ATTUNE PS FEM LT SZ 7 CEM KNEE (Femur) IMPLANT
ATTUNE PSRP INSR SZ7 5 KNEE (Insert) IMPLANT
BASE TIBIAL ROT PLAT SZ 7 KNEE (Knees) IMPLANT
BATTERY INSTRU NAVIGATION (MISCELLANEOUS) ×4 IMPLANT
BLADE SAW 70X12.5 (BLADE) ×1 IMPLANT
BLADE SAW 90X13X1.19 OSCILLAT (BLADE) ×1 IMPLANT
BLADE SAW 90X25X1.19 OSCILLAT (BLADE) ×1 IMPLANT
BONE CEMENT GENTAMICIN (Cement) ×2 IMPLANT
BSPLAT TIB 7 CMNT ROT PLAT STR (Knees) ×1 IMPLANT
BTRY SRG DRVR LF (MISCELLANEOUS) ×4
CEMENT BONE GENTAMICIN 40 (Cement) IMPLANT
COOLER POLAR GLACIER W/PUMP (MISCELLANEOUS) ×1 IMPLANT
CUFF TOURN SGL QUICK 24 (TOURNIQUET CUFF)
CUFF TOURN SGL QUICK 34 (TOURNIQUET CUFF)
CUFF TRNQT CYL 24X4X16.5-23 (TOURNIQUET CUFF) IMPLANT
CUFF TRNQT CYL 34X4.125X (TOURNIQUET CUFF) IMPLANT
DRAPE 3/4 80X56 (DRAPES) ×1 IMPLANT
DRAPE INCISE IOBAN 66X45 STRL (DRAPES) IMPLANT
DRSG DERMACEA 8X12 NADH (GAUZE/BANDAGES/DRESSINGS) IMPLANT
DRSG MEPILEX SACRM 8.7X9.8 (GAUZE/BANDAGES/DRESSINGS) ×1 IMPLANT
DRSG NON-ADHERENT DERMACEA 3X4 (GAUZE/BANDAGES/DRESSINGS) ×1 IMPLANT
DRSG OPSITE POSTOP 4X14 (GAUZE/BANDAGES/DRESSINGS) ×1 IMPLANT
DRSG TEGADERM 4X4.75 (GAUZE/BANDAGES/DRESSINGS) ×1 IMPLANT
DURAPREP 26ML APPLICATOR (WOUND CARE) ×2 IMPLANT
ELECT CAUTERY BLADE 6.4 (BLADE) ×1 IMPLANT
ELECT REM PT RETURN 9FT ADLT (ELECTROSURGICAL) ×1
ELECTRODE REM PT RTRN 9FT ADLT (ELECTROSURGICAL) ×1 IMPLANT
EX-PIN ORTHOLOCK NAV 4X150 (PIN) ×2 IMPLANT
GLOVE BIOGEL M STRL SZ7.5 (GLOVE) ×2 IMPLANT
GLOVE SRG 8 PF TXTR STRL LF DI (GLOVE) ×1 IMPLANT
GLOVE SURG UNDER POLY LF SZ8 (GLOVE) ×1
GOWN STRL REUS W/ TWL LRG LVL3 (GOWN DISPOSABLE) ×1 IMPLANT
GOWN STRL REUS W/TWL LRG LVL3 (GOWN DISPOSABLE) ×1
GOWN TOGA ZIPPER T7+ PEEL AWAY (MISCELLANEOUS) ×1 IMPLANT
HANDLE YANKAUER SUCT OPEN TIP (MISCELLANEOUS) ×1 IMPLANT
HEMOVAC 400CC 10FR (MISCELLANEOUS) ×1 IMPLANT
HOLDER FOLEY CATH W/STRAP (MISCELLANEOUS) ×1 IMPLANT
IV NS IRRIG 3000ML ARTHROMATIC (IV SOLUTION) ×1 IMPLANT
KIT TURNOVER KIT A (KITS) ×1 IMPLANT
KNIFE SCULPS 14X20 (INSTRUMENTS) ×1 IMPLANT
MANIFOLD NEPTUNE II (INSTRUMENTS) ×2 IMPLANT
NDL SPNL 20GX3.5 QUINCKE YW (NEEDLE) ×2 IMPLANT
NEEDLE SPNL 20GX3.5 QUINCKE YW (NEEDLE) ×2 IMPLANT
NS IRRIG 500ML POUR BTL (IV SOLUTION) ×1 IMPLANT
PACK TOTAL KNEE (MISCELLANEOUS) ×1 IMPLANT
PAD ABD DERMACEA PRESS 5X9 (GAUZE/BANDAGES/DRESSINGS) ×2 IMPLANT
PAD WRAPON POLAR KNEE (MISCELLANEOUS) ×1 IMPLANT
PIN DRILL FIX HALF THREAD (BIT) ×2 IMPLANT
PIN DRILL QUICK PACK ×2 IMPLANT
PIN FIXATION 1/8DIA X 3INL (PIN) ×1 IMPLANT
PULSAVAC PLUS IRRIG FAN TIP (DISPOSABLE) ×1
SOL PREP PVP 2OZ (MISCELLANEOUS) ×1
SOLUTION IRRIG SURGIPHOR (IV SOLUTION) ×1 IMPLANT
SOLUTION PREP PVP 2OZ (MISCELLANEOUS) ×1 IMPLANT
SPONGE DRAIN TRACH 4X4 STRL 2S (GAUZE/BANDAGES/DRESSINGS) ×1 IMPLANT
STAPLER SKIN PROX 35W (STAPLE) ×1 IMPLANT
STOCKINETTE IMPERV 14X48 (MISCELLANEOUS) ×1 IMPLANT
STRAP TIBIA SHORT (MISCELLANEOUS) ×1 IMPLANT
SUCTION FRAZIER HANDLE 10FR (MISCELLANEOUS) ×2
SUCTION TUBE FRAZIER 10FR DISP (MISCELLANEOUS) ×1 IMPLANT
SUT VIC AB 0 CT1 36 (SUTURE) ×1 IMPLANT
SUT VIC AB 1 CT1 36 (SUTURE) ×2 IMPLANT
SUT VIC AB 2-0 CT2 27 (SUTURE) ×1 IMPLANT
SYR 30ML LL (SYRINGE) ×2 IMPLANT
TIBIAL BASE ROT PLAT SZ 7 KNEE (Knees) ×1 IMPLANT
TIP FAN IRRIG PULSAVAC PLUS (DISPOSABLE) ×1 IMPLANT
TOWEL OR 17X26 4PK STRL BLUE (TOWEL DISPOSABLE) IMPLANT
TOWER CARTRIDGE SMART MIX (DISPOSABLE) ×1 IMPLANT
TRAP FLUID SMOKE EVACUATOR (MISCELLANEOUS) ×1 IMPLANT
TRAY FOLEY MTR SLVR 16FR STAT (SET/KITS/TRAYS/PACK) ×1 IMPLANT
WATER STERILE IRR 1000ML POUR (IV SOLUTION) ×1 IMPLANT
WRAPON POLAR PAD KNEE (MISCELLANEOUS) ×1

## 2022-04-16 NOTE — Anesthesia Preprocedure Evaluation (Addendum)
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 Bifascicular block   Neuro/Psych HOH    GI/Hepatic PUD,GERD  ,,  Endo/Other  diabetes, Type 2  Obesity   Renal/GU negative Renal ROS     Musculoskeletal  (+) Arthritis ,    Abdominal   Peds  Hematology S/p splenectomy   Anesthesia Other Findings   Reproductive/Obstetrics                             Anesthesia Physical Anesthesia Plan  ASA: 2  Anesthesia Plan: General and Spinal   Post-op Pain Management:    Induction: Intravenous  PONV Risk Score and Plan: 2 and Propofol infusion, TIVA, Treatment may vary due to age or medical condition and Ondansetron  Airway Management Planned: Natural Airway and Nasal Cannula  Additional Equipment:   Intra-op Plan:   Post-operative Plan:   Informed Consent: I have reviewed the patients History and Physical, chart, labs and discussed the procedure including the risks, benefits and alternatives for the proposed anesthesia with the patient or authorized representative who has indicated his/her understanding and acceptance.       Plan Discussed with: CRNA  Anesthesia Plan Comments: (Plan for spinal and GA with natural airway, LMA/GETA backup.  Patient consented for risks of anesthesia including but not limited  to:  - adverse reactions to medications - damage to eyes, teeth, lips or other oral mucosa - nerve damage due to positioning  - sore throat or hoarseness - headache, bleeding, infection, nerve damage 2/2 spinal - damage to heart, brain, nerves, lungs, other parts of body or loss of life  Informed patient about role of CRNA in peri- and intra-operative care.  Patient voiced understanding.)        Anesthesia Quick Evaluation

## 2022-04-16 NOTE — Evaluation (Signed)
Physical Therapy Evaluation Patient Details Name: Brent [name]. MRN: [id number] DOB: 10-Jun-1945 Today's Date: 04/16/2022  History of Present Illness  Pt admitted for L TKR. Pt is POD 0 at time of evaluation.  Clinical Impression  Pt is a pleasant 77 year old male who was admitted for L TKR. Pt performs bed mobility with min assist and transfers with mod A +2 and RW. Pt with weakness during standing, not safe to progress ambulation away from bedside. Recommend KI despite pt's ability to perform 10 SLRs in bed. Pt reports he feels his knee is hyperextending during weightbearing and feels unstable. KI should provide stability at this time. Pt demonstrates deficits with strength/mobility. Will need further mobility assessment, however anticipate pt will quickly progress and meet goals for home discharge. Would benefit from skilled PT to address above deficits and promote optimal return to PLOF. Recommend transition to [place] upon discharge from acute hospitalization.      Recommendations for follow up therapy are one component of a multi-disciplinary discharge planning process, led by the attending physician.  Recommendations may be updated based on patient status, additional functional criteria and insurance authorization.  Follow Up Recommendations Home health PT Can patient physically be transported by private vehicle: Yes    Assistance Recommended at Discharge Frequent or constant Supervision/Assistance  Patient can return home with the following  A lot of help with walking and/or transfers;A lot of help with bathing/dressing/bathroom;Help with stairs or ramp for entrance;Assist for transportation    Equipment Recommendations None recommended by PT  Recommendations for Other Services       Functional Status Assessment Patient has had a recent decline in their functional status and demonstrates the ability to make significant improvements in function in a reasonable and predictable amount  of time.     Precautions / Restrictions Precautions Precautions: Fall;Knee Precaution Booklet Issued: No Restrictions Weight Bearing Restrictions: Yes LLE Weight Bearing: Weight bearing as tolerated      Mobility  Bed Mobility Overal bed mobility: Needs Assistance Bed Mobility: Supine to Sit     Supine to sit: Min assist     General bed mobility comments: safe technique and follows commands well. Once seated, upright posture noted    Transfers Overall transfer level: Needs assistance Equipment used: Rolling walker (2 wheels) Transfers: Sit to/from Stand Sit to Stand: Mod assist, +2 physical assistance, From elevated surface           General transfer comment: initially attempted for transfer with one person. Able to achieve lift off but unable to achieve full upright posture. +2 performed with mod assist. Once standing, pt reported his surgical leg felt very weak and buckling. Able to step over to recliner, however further ambulation deferred this date due to safety    Ambulation/Gait                  Stairs            Wheelchair Mobility    Modified Rankin (Stroke Patients Only)       Balance Overall balance assessment: Needs assistance Sitting-balance support: Feet supported Sitting balance-Leahy Scale: Good     Standing balance support: Bilateral upper extremity supported Standing balance-Leahy Scale: Good                               Pertinent Vitals/Pain Pain Assessment Pain Assessment: No/denies pain    Home Living Family/patient expects to be discharged  to:: Private residence Living Arrangements: Spouse/significant other Available Help at Discharge: Family;Available 24 hours/day Type of Home: House Home Access: Stairs to enter Entrance Stairs-Rails: Right;Left (too wide to reach both) Entrance Stairs-Number of Steps: 2   Home Layout: One level Home Equipment: Conservation officer, nature (2 wheels);Cane - single  point;Rollator (4 wheels);Shower seat;BSC/3in1      Prior Function Prior Level of Function : Independent/Modified Independent             Mobility Comments: no recent falls ADLs Comments: retired, Database administrator; 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        Extremity/Trunk Assessment   Upper Extremity Assessment Upper Extremity Assessment: Overall WFL for tasks assessed    Lower Extremity Assessment Lower Extremity Assessment: Generalized weakness (L LE grossly 3/5)       Communication   Communication: No difficulties  Cognition Arousal/Alertness: Awake/alert Behavior During Therapy: WFL for tasks assessed/performed Overall Cognitive Status: Within Functional Limits for tasks assessed                                          General Comments      Exercises Total Joint Exercises Goniometric ROM: L knee AAROM: 3-80 degrees Other Exercises Other Exercises: Able to perform 10 SLRs, AP, and hip abd/add.   Assessment/Plan    PT Assessment Patient needs continued PT services  PT Problem List Decreased strength;Decreased balance;Decreased mobility;Pain       PT Treatment Interventions DME instruction;Gait training;Therapeutic exercise;Balance training;Stair training    PT Goals (Current goals can be found in the Care Plan section)  Acute Rehab PT Goals Patient Stated Goal: to go home PT Goal Formulation: With patient Time For Goal Achievement: 04/30/22 Potential to Achieve Goals: Good    Frequency BID     Co-evaluation               AM-PAC PT "6 Clicks" Mobility  Outcome Measure Help needed turning from your back to your side while in a flat bed without using bedrails?: A Little Help needed moving from lying on your back to sitting on the side of a flat bed without using bedrails?: A Little Help needed moving to and from a bed to a chair (including a wheelchair)?: A Lot Help needed standing up from a chair using your arms (e.g.,  wheelchair or bedside chair)?: A Lot Help needed to walk in hospital room?: A Lot Help needed climbing 3-5 steps with a railing? : A Lot 6 Click Score: 14    End of Session Equipment Utilized During Treatment: Gait belt Activity Tolerance: Patient tolerated treatment well Patient left: in chair;with chair alarm set;with family/visitor present Nurse Communication: Mobility status PT Visit Diagnosis: Difficulty in walking, not elsewhere classified (R26.2);Pain;Unsteadiness on feet (R26.81);Muscle weakness (generalized) (M62.81) Pain - Right/Left: Left Pain - part of body: Knee    Time: 1530-1603 PT Time Calculation (min) (ACUTE ONLY): 33 min   Charges:   PT Evaluation $PT Eval Low Complexity: 1 Low PT Treatments $Therapeutic Exercise: 8-22 mins        Greggory Stallion, PT, DPT, GCS [phone]   Sandi Towe 04/16/2022, 4:53 PM

## 2022-04-16 NOTE — Anesthesia Postprocedure Evaluation (Signed)
Anesthesia Post Note  Patient: Zakee Bruner.  Procedure(s) Performed: COMPUTER ASSISTED TOTAL KNEE ARTHROPLASTY - RNFA (Left: Knee)  Patient location during evaluation: PACU Anesthesia Type: Spinal Level of consciousness: awake and alert, oriented and patient cooperative Pain management: pain level controlled Vital Signs Assessment: post-procedure vital signs reviewed and stable Respiratory status: spontaneous breathing, nonlabored ventilation and respiratory function stable Cardiovascular status: blood pressure returned to baseline and stable Postop Assessment: adequate PO intake, no headache, no backache and spinal receding Anesthetic complications: no   No notable events documented.   Last Vitals:  Vitals:   04/16/22 1130 04/16/22 1145  BP: 133/73 (!) 148/63  Pulse: 73 64  Resp: 17 15  Temp:  (!) 36.4 C  SpO2: 96% 96%    Last Pain:  Vitals:   04/16/22 1145  TempSrc:   PainSc: 3                  Darrin Nipper

## 2022-04-16 NOTE — Transfer of Care (Signed)
Immediate Anesthesia Transfer of Care Note  Patient: Brent [name].  Procedure(s) Performed: COMPUTER ASSISTED TOTAL KNEE ARTHROPLASTY - RNFA (Left: Knee)  Patient Location: PACU  Anesthesia Type:Spinal  Level of Consciousness: drowsy  Airway & Oxygen Therapy: Patient Spontanous Breathing and Patient connected to face mask oxygen  Post-op Assessment: Report given to RN and Post -op Vital signs reviewed and stable  Post vital signs: Reviewed and stable  Last Vitals:  Vitals Value Taken Time  BP 140/72 04/16/22 1111  Temp    Pulse 69 04/16/22 1112  Resp 12 04/16/22 1112  SpO2 99 % 04/16/22 1112  Vitals shown include unvalidated device data.  Last Pain:  Vitals:   04/16/22 0635  TempSrc: Temporal  PainSc: 0-No pain         Complications: No notable events documented.

## 2022-04-16 NOTE — Plan of Care (Signed)
  Problem: Skin Integrity: Goal: Risk for impaired skin integrity will decrease Outcome: Progressing   Problem: Tissue Perfusion: Goal: Adequacy of tissue perfusion will improve Outcome: Progressing   Problem: Activity: Goal: Ability to avoid complications of mobility impairment will improve Outcome: Progressing Goal: Range of joint motion will improve Outcome: Progressing   Problem: Pain Management: Goal: Pain level will decrease with appropriate interventions Outcome: Progressing   Problem: Skin Integrity: Goal: Will show signs of wound healing Outcome: Progressing   Problem: Pain Managment: Goal: General experience of comfort will improve Outcome: Progressing   Problem: Safety: Goal: Ability to remain free from injury will improve Outcome: Progressing   Problem: Skin Integrity: Goal: Risk for impaired skin integrity will decrease Outcome: Progressing

## 2022-04-16 NOTE — Progress Notes (Signed)
Patient is not able to walk the distance required to go the bathroom, or he/she is unable to safely negotiate stairs required to access the bathroom.  A 3in1 BSC will alleviate this problem   Kenzi Bardwell P. Neshawn Aird, [name] M.D.  

## 2022-04-16 NOTE — Anesthesia Procedure Notes (Signed)
Spinal  Patient location during procedure: OR Start time: 04/16/2022 7:20 AM End time: 04/16/2022 7:32 AM Reason for block: surgical anesthesia Staffing Performed: resident/CRNA  Anesthesiologist: Darrin Nipper, MD Resident/CRNA: Johnna Acosta, CRNA Performed by: Johnna Acosta, CRNA Authorized by: Darrin Nipper, MD   Preanesthetic Checklist Completed: patient identified, IV checked, site marked, risks and benefits discussed, surgical consent, monitors and equipment checked, pre-op evaluation and timeout performed Spinal Block Patient position: sitting Prep: ChloraPrep Patient monitoring: heart rate, continuous pulse ox, blood pressure and cardiac monitor Approach: midline Location: L2-3 Injection technique: single-shot Needle Needle type: Whitacre and Introducer  Needle gauge: 24 G Needle length: 9 cm Assessment Sensory level: T10 Events: CSF return Additional Notes Sterile aseptic technique used throughout the procedure.  Negative paresthesia. Negative blood return. Positive free-flowing CSF. Expiration date of kit checked and confirmed. Patient tolerated procedure well, without complications.

## 2022-04-16 NOTE — Interval H&P Note (Signed)
History and Physical Interval Note:  04/16/2022 6:20 AM  Wynona Canes.  has presented today for surgery, with the diagnosis of PRIMARY OSTEOARTHRITIS OF LEFT KNEE..  The various methods of treatment have been discussed with the patient and family. After consideration of risks, benefits and other options for treatment, the patient has consented to  Procedure(s): COMPUTER ASSISTED TOTAL KNEE ARTHROPLASTY - RNFA (Left) as a surgical intervention.  The patient's history has been reviewed, patient examined, no change in status, stable for surgery.  I have reviewed the patient's chart and labs.  Questions were answered to the patient's satisfaction.     [place]

## 2022-04-16 NOTE — Op Note (Signed)
OPERATIVE NOTE  DATE OF SURGERY:  04/16/2022  PATIENT NAME:  Brent [name].   DOB: 17-May-1945  MRN: [id number]  PRE-OPERATIVE DIAGNOSIS: Degenerative arthrosis of the left knee, primary  POST-OPERATIVE DIAGNOSIS:  Same  PROCEDURE:  Left total knee arthroplasty using computer-assisted navigation  SURGEON:  Marciano Sequin. M.D.  ANESTHESIA: spinal  ESTIMATED BLOOD LOSS: 50 mL  FLUIDS REPLACED: 800 mL of crystalloid  TOURNIQUET TIME: 101 minutes  DRAINS: 2 medium Hemovac drains  SOFT TISSUE RELEASES: Anterior cruciate ligament, posterior cruciate ligament, deep medial collateral ligament, patellofemoral ligament  IMPLANTS UTILIZED: DePuy Attune size 7 posterior stabilized femoral component (cemented), size 7 rotating platform tibial component (cemented), 41 mm medialized dome patella (cemented), and a 5 mm stabilized rotating platform polyethylene insert.  INDICATIONS FOR SURGERY: Brent Novo. is a 77 y.o. year old male with a long history of progressive knee pain. X-rays demonstrated severe degenerative changes in tricompartmental fashion. The patient had not seen any significant improvement despite conservative nonsurgical intervention. After discussion of the risks and benefits of surgical intervention, the patient expressed understanding of the risks benefits and agree with plans for total knee arthroplasty.   The risks, benefits, and alternatives were discussed at length including but not limited to the risks of infection, bleeding, nerve injury, stiffness, blood clots, the need for revision surgery, cardiopulmonary complications, among others, and they were willing to proceed.  PROCEDURE IN DETAIL: The patient was brought into the operating room and, after adequate spinal anesthesia was achieved, a tourniquet was placed on the patient's upper thigh. The patient's knee and leg were cleaned and prepped with alcohol and DuraPrep and draped in the usual sterile fashion. A  "timeout" was performed as per usual protocol. The lower extremity was exsanguinated using an Esmarch, and the tourniquet was inflated to 300 mmHg. An anterior longitudinal incision was made followed by a standard mid vastus approach. The deep fibers of the medial collateral ligament were elevated in a subperiosteal fashion off of the medial flare of the tibia so as to maintain a continuous soft tissue sleeve. The patella was subluxed laterally and the patellofemoral ligament was incised. Inspection of the knee demonstrated severe degenerative changes with full-thickness loss of articular cartilage. Osteophytes were debrided using a rongeur. Anterior and posterior cruciate ligaments were excised. Two 4.0 mm Schanz pins were inserted in the femur and into the tibia for attachment of the array of trackers used for computer-assisted navigation. Hip center was identified using a circumduction technique. Distal landmarks were mapped using the computer. The distal femur and proximal tibia were mapped using the computer. The distal femoral cutting guide was positioned using computer-assisted navigation so as to achieve a 5 distal valgus cut. The femur was sized and it was felt that a size 7 femoral component was appropriate. A size 7 femoral cutting guide was positioned and the anterior cut was performed and verified using the computer. This was followed by completion of the posterior and chamfer cuts. Femoral cutting guide for the central box was then positioned in the center box cut was performed.  Attention was then directed to the proximal tibia. Medial and lateral menisci were excised. The extramedullary tibial cutting guide was positioned using computer-assisted navigation so as to achieve a 0 varus-valgus alignment and 3 posterior slope. The cut was performed and verified using the computer. The proximal tibia was sized and it was felt that a size 7 tibial tray was appropriate. Tibial and femoral trials were  inserted followed by insertion of a 5 mm polyethylene insert.  The knee was felt to be tight both in flexion and extension.  Trial components were removed and the extramedullary tibial cutting guide was repositioned so as to resect an additional 2 mm of bone from the proximal tibia.  Cut was performed and verified using the computer.  Trial components were reinserted followed by insertion of a 5 mm polyethylene insert.  This allowed for excellent mediolateral soft tissue balancing both in flexion and in full extension. Finally, the patella was cut and prepared so as to accommodate a 41 mm medialized dome patella. A patella trial was placed and the knee was placed through a range of motion with excellent patellar tracking appreciated. The femoral trial was removed after debridement of posterior osteophytes. The central post-hole for the tibial component was reamed followed by insertion of a keel punch. Tibial trials were then removed. Cut surfaces of bone were irrigated with copious amounts of normal saline using pulsatile lavage and then suctioned dry. Polymethylmethacrylate cement with gentamicin was prepared in the usual fashion using a vacuum mixer. Cement was applied to the cut surface of the proximal tibia as well as along the undersurface of a size 7 rotating platform tibial component. Tibial component was positioned and impacted into place. Excess cement was removed using [employer] fast streamer. Cement was then applied to the cut surfaces of the femur as well as along the posterior flanges of the size 7 femoral component. The femoral component was positioned and impacted into place. Excess cement was removed using [employer] fast streamer. A 5 mm polyethylene trial was inserted and the knee was brought into full extension with steady axial compression applied. Finally, cement was applied to the backside of a 41 mm medialized dome patella and the patellar component was positioned and patellar clamp applied. Excess cement was  removed using [employer] fast streamer. After adequate curing of the cement, the tourniquet was deflated after a total tourniquet time of 101 minutes. Hemostasis was achieved using electrocautery. The knee was irrigated with copious amounts of normal saline using pulsatile lavage followed by 450 ml of Irrisept and then suctioned dry. 20 mL of 1.3% Exparel and 60 mL of 0.25% Marcaine in 40 mL of normal saline was injected along the posterior capsule, medial and lateral gutters, and along the arthrotomy site. A 5 mm stabilized rotating platform polyethylene insert was inserted and the knee was placed through a range of motion with excellent mediolateral soft tissue balancing appreciated and excellent patellar tracking noted. 2 medium drains were placed in the wound bed and brought out through separate stab incisions. The medial parapatellar portion of the incision was reapproximated using interrupted sutures of #1 Vicryl. Subcutaneous tissue was approximated in layers using first #0 Vicryl followed #2-0 Vicryl. The skin was approximated with skin staples. A sterile dressing was applied.  The patient tolerated the procedure well and was transported to the recovery room in stable condition.    Brent Cleere P. Holley Bouche., M.D.

## 2022-04-16 NOTE — Anesthesia Procedure Notes (Signed)
Date/Time: 04/16/2022 7:33 AM  Performed by: Johnna Acosta, CRNAPre-anesthesia Checklist: Patient identified, Emergency Drugs available, Suction available, Patient being monitored and Timeout performed Patient Re-evaluated:Patient Re-evaluated prior to induction Oxygen Delivery Method: Simple face mask Preoxygenation: Pre-oxygenation with 100% oxygen Induction Type: IV induction Ventilation: Oral airway inserted - appropriate to patient size

## 2022-04-16 NOTE — TOC Progression Note (Signed)
Transition of Care [facility]) - Progression Note    Patient Details  Name: Brent [name]. MRN: [id number] Date of Birth: 1945/09/11  Transition [facility] CM/SW [place], RN Phone Number: 04/16/2022, 12:14 PM  Clinical Narrative:    The patient is set up with [facility] services prior to surgery by surgeons office        Expected Discharge Plan and Services                                               Social Determinants of Health (SDOH) Interventions SDOH Screenings   Tobacco Use: Medium Risk (04/16/2022)    Readmission Risk Interventions     No data to display

## 2022-04-17 ENCOUNTER — Encounter: Payer: Self-pay | Admitting: Orthopedic Surgery

## 2022-04-17 DIAGNOSIS — M1712 Unilateral primary osteoarthritis, left knee: Secondary | ICD-10-CM | POA: Diagnosis not present

## 2022-04-17 LAB — GLUCOSE, CAPILLARY: Glucose-Capillary: 163 mg/dL — ABNORMAL HIGH (ref 70–99)

## 2022-04-17 MED ORDER — TRAMADOL HCL 50 MG PO TABS: 50.0000 mg | ORAL_TABLET | ORAL | 0 refills | Status: DC | PRN

## 2022-04-17 MED ORDER — CELECOXIB 200 MG PO CAPS: 200.0000 mg | ORAL_CAPSULE | Freq: Two times a day (BID) | ORAL | 1 refills | Status: AC

## 2022-04-17 MED ORDER — ENOXAPARIN SODIUM 40 MG/0.4ML IJ SOSY: 40.0000 mg | PREFILLED_SYRINGE | INTRAMUSCULAR | 0 refills | Status: DC

## 2022-04-17 MED ORDER — OXYCODONE HCL 5 MG PO TABS: 5.0000 mg | ORAL_TABLET | ORAL | 0 refills | Status: DC | PRN

## 2022-04-17 NOTE — Progress Notes (Signed)
Discharge instructions given to the patient and his spouse.  Education emphasized on lovenox SQ administration, signs of infection to watch for, and surgical wound care.  Encouraged to call the doctor for questions.  Both verbalized understanding.  Discharged home.

## 2022-04-17 NOTE — Progress Notes (Signed)
  Subjective: 1 Day Post-Op Procedure(s) (LRB): COMPUTER ASSISTED TOTAL KNEE ARTHROPLASTY - RNFA (Left) Patient reports pain as mild.   Patient is well, and has had no acute complaints or problems Plan is to go Home after hospital stay. Negative for chest pain and shortness of breath Fever: no Gastrointestinal: Negative for nausea and vomiting  Objective: Vital signs in last 24 hours: Temp:  [97.5 F (36.4 C)-98.2 F (36.8 C)] 97.6 F (36.4 C) (03/12 0408) Pulse Rate:  [57-89] 57 (03/12 0408) Resp:  [12-18] 16 (03/12 0408) BP: (123-161)/(58-80) 147/65 (03/12 0408) SpO2:  [94 %-99 %] 97 % (03/12 0408)  Intake/Output from previous day:  Intake/Output Summary (Last 24 hours) at 04/17/2022 0717 Last data filed at 04/17/2022 0500 Gross per 24 hour  Intake 1905 ml  Output 1180 ml  Net 725 ml    Intake/Output this shift: No intake/output data recorded.  Labs: No results for input(s): "HGB" in the last 72 hours. No results for input(s): "WBC", "RBC", "HCT", "PLT" in the last 72 hours. No results for input(s): "NA", "K", "CL", "CO2", "BUN", "CREATININE", "GLUCOSE", "CALCIUM" in the last 72 hours. No results for input(s): "LABPT", "INR" in the last 72 hours.   EXAM General - Patient is Alert and Oriented Extremity - Neurovascular intact Sensation intact distally Dorsiflexion/Plantar flexion intact Compartment soft Dressing/Incision - clean, dry, with the Hemovac tubing removed with no complication.  The Hemovac tubing was intact on removal. Motor Function - intact, moving foot and toes well on exam.  Able to straight leg raise independently.  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   Weight as of this encounter: 108 kg. Advance diet Up with therapy D/C IV fluids Discharge home with home health  DVT Prophylaxis - Lovenox, Foot Pumps, and TED hose Weight-Bearing as tolerated to left leg  Reche Dixon, PA-C Orthopaedic Surgery 04/17/2022, 7:17 AM

## 2022-04-17 NOTE — Progress Notes (Signed)
Physical Therapy Treatment Patient Details Name: Brent [name]. MRN: [id number] DOB: 17-Dec-1945 Today's Date: 04/17/2022   History of Present Illness Pt admitted for L TKR. Pt is POD 0 at time of evaluation.    PT Comments    Pt is making good progress towards goals. Instability noted yesterday, however much improved this date. Ambulation performed with/without KI however pt prefers KI at this time. Education given on donning/doffing. Able to perform ambulation/stair training. HEP given and ROM improving. Has met all goals. Plan to dc this date.   Recommendations for follow up therapy are one component of a multi-disciplinary discharge planning process, led by the attending physician.  Recommendations may be updated based on patient status, additional functional criteria and insurance authorization.  Follow Up Recommendations  Home health PT Can patient physically be transported by private vehicle: Yes   Assistance Recommended at Discharge Set up Supervision/Assistance  Patient can return home with the following A little help with walking and/or transfers;A little help with bathing/dressing/bathroom;Help with stairs or ramp for entrance;Assist for transportation   Equipment Recommendations  None recommended by PT    Recommendations for Other Services       Precautions / Restrictions Precautions Precautions: Fall;Knee Precaution Booklet Issued: Yes (comment) Required Braces or Orthoses: Knee Immobilizer - Left Knee Immobilizer - Left: On when out of bed or walking Restrictions Weight Bearing Restrictions: Yes LLE Weight Bearing: Weight bearing as tolerated     Mobility  Bed Mobility               General bed mobility comments: NT, received seated at EOB with OT    Transfers Overall transfer level: Needs assistance Equipment used: Rolling walker (2 wheels) Transfers: Sit to/from Stand Sit to Stand: Min assist           General transfer comment: improved  technique from standard height surface. RW used. Transfers performed with/without KI. Pt prefers KI on.    Ambulation/Gait Ambulation/Gait assistance: Min guard Gait Distance (Feet): 300 Feet Assistive device: Rolling walker (2 wheels) Gait Pattern/deviations: Step-through pattern       General Gait Details: ambulated around unit + down hall with KI and reciprocal gait pattern. Safe technique with occasional cues for sequencing with RW and posture.   Stairs Stairs: Yes Stairs assistance: Min guard Stair Management: One rail Right, Step to pattern Number of Stairs: 4 General stair comments: demonstrated prior to performance. Safe technique   Wheelchair Mobility    Modified Rankin (Stroke Patients Only)       Balance Overall balance assessment: Needs assistance Sitting-balance support: Feet supported Sitting balance-Leahy Scale: Good     Standing balance support: Bilateral upper extremity supported Standing balance-Leahy Scale: Good                              Cognition Arousal/Alertness: Awake/alert Behavior During Therapy: WFL for tasks assessed/performed Overall Cognitive Status: Within Functional Limits for tasks assessed                                 General Comments: pleasant and agreeable to session        Exercises Total Joint Exercises Goniometric ROM: L Knee AAROM: 0-95 degrees Other Exercises Other Exercises: reviewed and performed written HEP. Educated in frequency and duration Other Exercises: ambulated to bathroom with cga. Left in bathroom to pull call bell when ready  for assistance    General Comments        Pertinent Vitals/Pain Pain Assessment Pain Assessment: 0-10 Pain Score: 2  Pain Location: L knee Pain Descriptors / Indicators: Operative site guarding Pain Intervention(s): Limited activity within patient's tolerance, Repositioned, Ice applied    Home Living                          Prior  Function            PT Goals (current goals can now be found in the care plan section) Acute Rehab PT Goals Patient Stated Goal: to go home PT Goal Formulation: With patient Time For Goal Achievement: 04/30/22 Potential to Achieve Goals: Good Progress towards PT goals: Progressing toward goals    Frequency    BID      PT Plan Current plan remains appropriate    Co-evaluation              AM-PAC PT "6 Clicks" Mobility   Outcome Measure  Help needed turning from your back to your side while in a flat bed without using bedrails?: A Little Help needed moving from lying on your back to sitting on the side of a flat bed without using bedrails?: A Little Help needed moving to and from a bed to a chair (including a wheelchair)?: A Little Help needed standing up from a chair using your arms (e.g., wheelchair or bedside chair)?: A Little Help needed to walk in hospital room?: A Little Help needed climbing 3-5 steps with a railing? : A Little 6 Click Score: 18    End of Session Equipment Utilized During Treatment: Gait belt Activity Tolerance: Patient tolerated treatment well Patient left:  (left in bathroom; RN notified) Nurse Communication: Mobility status PT Visit Diagnosis: Difficulty in walking, not elsewhere classified (R26.2);Pain;Unsteadiness on feet (R26.81);Muscle weakness (generalized) (M62.81) Pain - Right/Left: Left Pain - part of body: Knee     Time: [id number] PT Time Calculation (min) (ACUTE ONLY): 41 min  Charges:  $Gait Training: 8-22 mins $Therapeutic Exercise: 8-22 mins $Therapeutic Activity: 8-22 mins                     Greggory Stallion, PT, DPT, GCS [phone]    Brent [name] 04/17/2022, 10:46 AM

## 2022-04-17 NOTE — Evaluation (Signed)
Occupational Therapy Evaluation Patient Details Name: Brent [name]. MRN: [id number] DOB: [birth year] Today's Date: 04/17/2022   History of Present Illness Pt admitted for L TKR. PMH includes T2DM, obesity, hyperlipidemia.   Clinical Impression   Patient seen for OT evaluation. Spouse present. Pt/spouse were educated on polar care system, precautions for LLE WBAT, and no pillow under L knee. Education and demonstration was provided for LB dressing AE including reacher and sock aid with good carryover (already has reacher at home). Handout provided. Pt able to don/doff R sock without AE, however, required assistance for L sock. Wife reports providing assistance at home with LB dressing PRN. Pt will benefit from acute OT to increase overall independence in the areas of ADLs and functional mobility in order to safely discharge to next venue of care. OT recommends ongoing therapy upon discharge to maximize safety and independence with ADLs, decrease fall risk, decrease caregiver burden, and promote return to PLOF.     Recommendations for follow up therapy are one component of a multi-disciplinary discharge planning process, led by the attending physician.  Recommendations may be updated based on patient status, additional functional criteria and insurance authorization.   Follow Up Recommendations  Home health OT     Assistance Recommended at Discharge Set up Supervision/Assistance  Patient can return home with the following A little help with walking and/or transfers;A little help with bathing/dressing/bathroom;Assistance with cooking/housework;Assist for transportation;Help with stairs or ramp for entrance    Functional Status Assessment  Patient has had a recent decline in their functional status and demonstrates the ability to make significant improvements in function in a reasonable and predictable amount of time.  Equipment Recommendations  None recommended by OT    Recommendations for  Other Services       Precautions / Restrictions Precautions Precautions: Fall;Knee Precaution Booklet Issued: Yes (comment) Required Braces or Orthoses: Knee Immobilizer - Left Knee Immobilizer - Left: On when out of bed or walking Restrictions Weight Bearing Restrictions: Yes LLE Weight Bearing: Weight bearing as tolerated      Mobility Bed Mobility               General bed mobility comments: NT, received/left sitting EOB    Transfers Overall transfer level: Needs assistance Equipment used: Rolling walker (2 wheels) Transfers: Sit to/from Stand Sit to Stand: Min assist                  Balance Overall balance assessment: Needs assistance Sitting-balance support: Feet supported Sitting balance-Leahy Scale: Good     Standing balance support: Bilateral upper extremity supported, During functional activity Standing balance-Leahy Scale: Good                             ADL either performed or assessed with clinical judgement   ADL Overall ADL's : Needs assistance/impaired Eating/Feeding: Modified independent Eating/Feeding Details (indicate cue type and reason): eating breakfast upon arrival Grooming: Standing;Supervision/safety;Set up Grooming Details (indicate cue type and reason): anticipate         Upper Body Dressing : Set up;Sitting   Lower Body Dressing: Moderate assistance;Sitting/lateral leans Lower Body Dressing Details (indicate cue type and reason): able to don/doff R sock without AE, assist for L sock Toilet Transfer: Minimal assistance;Rolling walker (2 wheels) Toilet Transfer Details (indicate cue type and reason): simulated         Functional mobility during ADLs: Min guard;Rolling walker (2 wheels) (to take 1 step  forward + back at EOB)       Vision Baseline Vision/History: 1 Wears glasses (readers) Patient Visual Report: No change from baseline       Perception     Praxis      Pertinent Vitals/Pain Pain  Assessment Pain Assessment: 0-10 Pain Score: 2  Pain Location: L knee Pain Descriptors / Indicators: Operative site guarding Pain Intervention(s): Limited activity within patient's tolerance, Repositioned, Ice applied     Hand Dominance     Extremity/Trunk Assessment Upper Extremity Assessment Upper Extremity Assessment: Overall WFL for tasks assessed   Lower Extremity Assessment Lower Extremity Assessment: Generalized weakness;LLE deficits/detail LLE Deficits / Details: s/p TKR       Communication Communication Communication: No difficulties   Cognition Arousal/Alertness: Awake/alert Behavior During Therapy: WFL for tasks assessed/performed Overall Cognitive Status: Within Functional Limits for tasks assessed         General Comments: A&Ox4, good safety awareness     General Comments       Exercises Other Exercises Other Exercises: OT provided education re: role of OT, OT POC, post acute recs, sitting up for all meals, EOB/OOB mobility with assistance, home/fall safety, LLE WBAT, no pillow under L knee, LB dressing AE (reacher, sock aid), polar care mgmt, adaptive strategies for ADLs (completing dressing/bathing in sitting), use of shower seat for bathing, handout provided     Shoulder Instructions      Home Living Family/patient expects to be discharged to:: Private residence Living Arrangements: Spouse/significant other Available Help at Discharge: Family;Available 24 hours/day Type of Home: House Home Access: Stairs to enter [employer] of Steps: 2 Entrance Stairs-Rails: Right;Left (too wide to reach both) Home Layout: One level     Bathroom Shower/Tub: Occupational psychologist: Handicapped height     Home Equipment: Conservation officer, nature (2 wheels);Cane - single point;Rollator (4 wheels);BSC/3in1;Shower seat - built in;Grab bars - tub/shower;Adaptive equipment Adaptive Equipment: Reacher        Prior Functioning/Environment Prior Level  of Function : Independent/Modified Independent;Driving             Mobility Comments: no recent falls ADLs Comments: Pt is retired, Immunologist. Wife assists with LB dressing, cooking, cleaning.        OT Problem List: Decreased strength;Decreased activity tolerance;Impaired balance (sitting and/or standing);Pain;Decreased knowledge of use of DME or AE;Decreased knowledge of precautions      OT Treatment/Interventions: Self-care/ADL training;Therapeutic exercise;Neuromuscular education;Energy conservation;DME and/or AE instruction;Manual therapy;Modalities;Balance training;Patient/family education;Visual/perceptual remediation/compensation;Cognitive remediation/compensation;Therapeutic activities;Splinting    OT Goals(Current goals can be found in the care plan section) Acute Rehab OT Goals Patient Stated Goal: return home OT Goal Formulation: With patient/family Time For Goal Achievement: 05/01/22 Potential to Achieve Goals: Good   OT Frequency: Min 2X/week    Co-evaluation              AM-PAC OT "6 Clicks" Daily Activity     Outcome Measure Help from another person eating meals?: None Help from another person taking care of personal grooming?: None Help from another person toileting, which includes using toliet, bedpan, or urinal?: A Lot Help from another person bathing (including washing, rinsing, drying)?: A Lot Help from another person to put on and taking off regular upper body clothing?: None Help from another person to put on and taking off regular lower body clothing?: A Lot 6 Click Score: 18   End of Session Equipment Utilized During Treatment: Gait belt;Rolling walker (2 wheels);Left knee immobilizer Nurse Communication: Precautions;Weight bearing status;Mobility status  Activity Tolerance: Patient tolerated treatment well Patient left: in bed;with call bell/phone within reach;with family/visitor present;Other (comment) (handoff to PT)  OT Visit Diagnosis:  Muscle weakness (generalized) (M62.81);Pain;Unsteadiness on feet (R26.81) Pain - Right/Left: Right Pain - part of body: Knee                Time: [id number] OT Time Calculation (min): 16 min Charges:  OT General Charges $OT Visit: 1 Visit OT Evaluation $OT Eval Low Complexity: 1 Low  [facility] MS, OTR/L ascom [phone]  04/17/22, 11:56 AM

## 2022-04-17 NOTE — TOC Progression Note (Signed)
Transition of Care [facility]) - Progression Note    Patient Details  Name: Brent [name]. MRN: [id number] Date of Birth: [birth year]  [facility]) CM/SW [place], RN Phone Number: 04/17/2022, 8:27 AM  Clinical Narrative:    The patient lives at home with his wife He has Conservation officer, nature (2 wheels);Cane - single point;Rollator (4 wheels);Shower seat;BSC/3in1  at home  He is set up with [facility] services No additonal needs   Expected Discharge Plan: [place] Barriers to Discharge: No Barriers Identified  Expected Discharge Plan and Services   Discharge Planning Services: CM Consult Post Acute Care Choice: [place] arrangements for the past 2 months: Single Family Home Expected Discharge Date: 04/17/22                         HH Arranged: PT HH Agency: [place] Date [place]: 04/17/22 Time [place]: 0825 Representative spoke with at [place]: [place]   Social Determinants of Health ([place]) Interventions [place]: No Food Insecurity (04/16/2022)  Housing: Low Risk  (04/16/2022)  Transportation Needs: No Transportation Needs (04/16/2022)  Utilities: Not At Risk (04/16/2022)  Tobacco Use: Medium Risk (04/16/2022)    Readmission Risk Interventions     No data to display

## 2022-04-17 NOTE — Discharge Summary (Signed)
Physician Discharge Summary  Subjective: 1 Day Post-Op Procedure(s) (LRB): COMPUTER ASSISTED TOTAL KNEE ARTHROPLASTY - RNFA (Left) Patient reports pain as mild.   Patient seen in rounds with Dr. Marry Guan. Patient is well, and has had no acute complaints or problems Patient is ready to go home after physical therapy  Physician Discharge Summary  Patient ID: Brent [name]. MRN: [id number] DOB/AGE: April 19, 1945 77 y.o.  Admit date: 04/16/2022 Discharge date: 04/17/2022  Admission Diagnoses:  Discharge Diagnoses:  Principal Problem:   Total knee replacement status   Discharged Condition: good  Hospital Course: The patient is postop day 1 from a left total knee arthroplasty.  The patient is doing well since surgery.  His vitals have remained stable.  His pain is manageable.  His Hemovac was removed this morning.  The patient is ready to go home after physical therapy.  Treatments: surgery:   Left total knee arthroplasty using computer-assisted navigation   SURGEON:  Marciano Sequin. M.D.   ANESTHESIA: spinal   ESTIMATED BLOOD LOSS: 50 mL   FLUIDS REPLACED: 800 mL of crystalloid   TOURNIQUET TIME: 101 minutes   DRAINS: 2 medium Hemovac drains   SOFT TISSUE RELEASES: Anterior cruciate ligament, posterior cruciate ligament, deep medial collateral ligament, patellofemoral ligament   IMPLANTS UTILIZED: DePuy Attune size 7 posterior stabilized femoral component (cemented), size 7 rotating platform tibial component (cemented), 41 mm medialized dome patella (cemented), and a 5 mm stabilized rotating platform polyethylene insert.    Discharge Exam: Blood pressure (!) 147/65, pulse (!) 57, temperature 97.6 F (36.4 C), resp. rate 16, height '5\' 8"'$  (1.727 m), weight 108 kg, SpO2 97 %.   Disposition: Discharge disposition: 01-Home or Self Care        Allergies as of 04/17/2022   No Known Allergies      Medication List     STOP taking these medications    aspirin EC  81 MG tablet   naproxen sodium 220 MG tablet Commonly known as: ALEVE       TAKE these medications    atorvastatin 40 MG tablet Commonly known as: LIPITOR Take 40 mg by mouth at bedtime.   calcium carbonate 750 MG chewable tablet Commonly known as: TUMS EX Chew 2 tablets by mouth as needed for heartburn.   celecoxib 200 MG capsule Commonly known as: CELEBREX Take 1 capsule (200 mg total) by mouth 2 (two) times daily.   CINNAMON PO Take 2 capsules by mouth daily.   enoxaparin 40 MG/0.4ML injection Commonly known as: LOVENOX Inject 0.4 mLs (40 mg total) into the skin daily for 14 days.   Fish Oil 1000 MG Caps Take 1,000 mg by mouth in the morning and at bedtime.   lisinopril 40 MG tablet Commonly known as: ZESTRIL Take 40 mg by mouth every morning.   Melatonin 10 MG Tabs Take 1 tablet by mouth daily.   Mens 50+ Multi Vitamin/Min Tabs Take 1 tablet by mouth daily.   metFORMIN 500 MG tablet Commonly known as: GLUCOPHAGE Take 500-1,000 mg by mouth See admin instructions. Take 1000 mg by mouth in the morning and 500 mg at night   oxyCODONE 5 MG immediate release tablet Commonly known as: Oxy IR/ROXICODONE Take 1 tablet (5 mg total) by mouth every 4 (four) hours as needed for moderate pain (pain score 4-6).   polyvinyl alcohol 1.4 % ophthalmic solution Commonly known as: LIQUIFILM TEARS Place 1 drop into both eyes as needed for dry eyes.   sildenafil 100  MG tablet Commonly known as: VIAGRA Take 100 mg by mouth at bedtime as needed for erectile dysfunction.   traMADol 50 MG tablet Commonly known as: ULTRAM Take 1-2 tablets (50-100 mg total) by mouth every 4 (four) hours as needed for moderate pain.               Durable Medical Equipment  (From admission, onward)           Start     Ordered   04/16/22 1214  DME Walker rolling  Once       Question:  Patient needs a walker to treat with the following condition  Answer:  Total knee replacement  status   04/16/22 1213   04/16/22 1214  DME Bedside commode  Once       Comments: Patient is not able to walk the distance required to go the bathroom, or he/she is unable to safely negotiate stairs required to access the bathroom.  A 3in1 BSC will alleviate this problem  Question:  Patient needs a bedside commode to treat with the following condition  Answer:  Total knee replacement status   04/16/22 1213            Follow-up Information     Watt Climes, PA Follow up on 04/30/2022.   Specialty: Physician Assistant Why: at 1:15pm Contact information: [place] [address] [phone]         Dereck Leep, MD Follow up on 05/29/2022.   Specialty: Orthopedic Surgery Why: at 3:00pm Contact information: [place] [address] [phone]                 Signed: Prescott Parma, Kieran Arreguin 04/17/2022, 7:20 AM   Objective: Vital signs in last 24 hours: Temp:  [97.5 F (36.4 C)-98.2 F (36.8 C)] 97.6 F (36.4 C) (03/12 0408) Pulse Rate:  [57-89] 57 (03/12 0408) Resp:  [12-18] 16 (03/12 0408) BP: (123-161)/(58-80) 147/65 (03/12 0408) SpO2:  [94 %-99 %] 97 % (03/12 0408)  Intake/Output from previous day:  Intake/Output Summary (Last 24 hours) at 04/17/2022 0720 Last data filed at 04/17/2022 0500 Gross per 24 hour  Intake 1905 ml  Output 1180 ml  Net 725 ml    Intake/Output this shift: No intake/output data recorded.  Labs: No results for input(s): "HGB" in the last 72 hours. No results for input(s): "WBC", "RBC", "HCT", "PLT" in the last 72 hours. No results for input(s): "NA", "K", "CL", "CO2", "BUN", "CREATININE", "GLUCOSE", "CALCIUM" in the last 72 hours. No results for input(s): "LABPT", "INR" in the last 72 hours.  EXAM: General - Patient is Alert and Oriented Extremity - Neurovascular intact Sensation intact distally Dorsiflexion/Plantar flexion intact Compartment soft Incision - clean, dry,  with the Hemovac removed with no complication Motor Function -plantarflexion and dorsiflexion intact.  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   Weight as of this encounter: 108 kg. Advance diet Up with therapy D/C IV fluids Discharge home with home health Diet - Regular diet Follow up - in 2 weeks Activity - WBAT Disposition - Home Condition Upon Discharge - Stable DVT Prophylaxis - Lovenox and TED hose  Reche Dixon, PA-C Orthopaedic Surgery 04/17/2022, 7:20 AM

## 2022-05-30 ENCOUNTER — Other Ambulatory Visit: Payer: Self-pay

## 2022-05-30 DIAGNOSIS — M4306 Spondylolysis, lumbar region: Secondary | ICD-10-CM

## 2022-05-31 ENCOUNTER — Ambulatory Visit
Admission: RE | Admit: 2022-05-31 | Discharge: 2022-05-31 | Disposition: A | Discharge status: Home / Self Care | Payer: Medicare Other | Source: Ambulatory Visit | Attending: Neurosurgery | Admitting: Neurosurgery

## 2022-05-31 ENCOUNTER — Ambulatory Visit (INDEPENDENT_AMBULATORY_CARE_PROVIDER_SITE_OTHER): Payer: Medicare Other | Admitting: Neurosurgery

## 2022-05-31 ENCOUNTER — Encounter: Payer: Self-pay | Admitting: Neurosurgery

## 2022-05-31 VITALS — BP 148/70 | Ht 68.0 in | Wt 231.6 lb

## 2022-05-31 DIAGNOSIS — M4306 Spondylolysis, lumbar region: Secondary | ICD-10-CM | POA: Diagnosis present

## 2022-05-31 DIAGNOSIS — M533 Sacrococcygeal disorders, not elsewhere classified: Secondary | ICD-10-CM | POA: Diagnosis not present

## 2022-05-31 NOTE — Progress Notes (Signed)
   DOS: 05/24/21 L5-S1 ALIF and PSF   HISTORY OF PRESENT ILLNESS: 05/31/2022 Brent [name] presents today with continued mid back pain.  He has previously been worked up for sacroiliac joint pain.  He had his knee replaced.  That has been helpful for him.  He has no radicular pain.  11/29/2022 Brent [name] is doing reasonably well.  He had an SI joint injection on both sides in August which caused greater than 50% relief.  He has substantial left knee osteoarthritis for which she is considering a knee replacement.  08/24/21 Brent [name] is status post anterior lumbar interbody fusion.  His severe radicular pain is improved compared to preop.  However, he continues to have discomfort in his posterior buttocks bilaterally and in his back.  He spends most of his time sitting in his recliner.     PHYSICAL EXAMINATION:   Vitals:   05/31/22 0907  BP: (!) 148/70   General: Patient is well developed, well nourished, calm, collected, and in no apparent distress.  NEUROLOGICAL:  General: In no acute distress.  Awake, alert, oriented to person, place, and time. Pupils equal round and reactive to light.   Strength:  Side Iliopsoas Quads Hamstring PF DF EHL  R L Incision c/d/i   ROS (Neurologic): Negative except as noted above  IMAGING: No complications noted.  He appears to have fusion across the disc space.  ASSESSMENT/PLAN:  Brent [name] is doing reasonably well after anterior lumbar interbody fusion with improvement of his severe radicular pain.  However, he continues to have some back pain.  He had a good response to bilateral sacroiliac joint injections.    If he would like to proceed with sacroiliac joint fusion, he will let me know.  I would then refer him to [facility] for this.  He would like to talk to Dr. Mariah Milling about further injections.  I will send a message to her.     I spent a total of 15 minutes in face-to-face and non-face-to-face activities related to  this patient's care today.   Venetia Night MD, [facility] Department of Neurosurgery

## 2022-07-25 ENCOUNTER — Other Ambulatory Visit: Payer: Self-pay

## 2022-07-25 ENCOUNTER — Other Ambulatory Visit: Payer: Self-pay | Admitting: Physical Medicine & Rehabilitation

## 2022-07-25 DIAGNOSIS — M5441 Lumbago with sciatica, right side: Secondary | ICD-10-CM

## 2022-07-25 DIAGNOSIS — M5442 Lumbago with sciatica, left side: Secondary | ICD-10-CM

## 2022-08-20 ENCOUNTER — Ambulatory Visit
Admission: RE | Admit: 2022-08-20 | Discharge: 2022-08-20 | Disposition: A | Discharge status: Home / Self Care | Payer: Medicare Other | Source: Ambulatory Visit | Attending: Physical Medicine & Rehabilitation | Admitting: Physical Medicine & Rehabilitation

## 2022-08-20 DIAGNOSIS — M5442 Lumbago with sciatica, left side: Secondary | ICD-10-CM

## 2022-08-20 DIAGNOSIS — M5441 Lumbago with sciatica, right side: Secondary | ICD-10-CM

## 2023-02-20 ENCOUNTER — Ambulatory Visit: Payer: Medicare Other | Admitting: Dermatology

## 2023-03-06 ENCOUNTER — Ambulatory Visit: Payer: PPO | Admitting: Dermatology

## 2023-03-06 ENCOUNTER — Encounter: Payer: Self-pay | Admitting: Dermatology

## 2023-03-06 DIAGNOSIS — D229 Melanocytic nevi, unspecified: Secondary | ICD-10-CM

## 2023-03-06 DIAGNOSIS — L578 Other skin changes due to chronic exposure to nonionizing radiation: Secondary | ICD-10-CM | POA: Diagnosis not present

## 2023-03-06 DIAGNOSIS — D492 Neoplasm of unspecified behavior of bone, soft tissue, and skin: Secondary | ICD-10-CM | POA: Diagnosis not present

## 2023-03-06 DIAGNOSIS — D1801 Hemangioma of skin and subcutaneous tissue: Secondary | ICD-10-CM

## 2023-03-06 DIAGNOSIS — Z872 Personal history of diseases of the skin and subcutaneous tissue: Secondary | ICD-10-CM | POA: Diagnosis not present

## 2023-03-06 DIAGNOSIS — L821 Other seborrheic keratosis: Secondary | ICD-10-CM | POA: Diagnosis not present

## 2023-03-06 DIAGNOSIS — W908XXA Exposure to other nonionizing radiation, initial encounter: Secondary | ICD-10-CM | POA: Diagnosis not present

## 2023-03-06 DIAGNOSIS — L814 Other melanin hyperpigmentation: Secondary | ICD-10-CM

## 2023-03-06 DIAGNOSIS — Z85828 Personal history of other malignant neoplasm of skin: Secondary | ICD-10-CM | POA: Diagnosis not present

## 2023-03-06 DIAGNOSIS — Z1283 Encounter for screening for malignant neoplasm of skin: Secondary | ICD-10-CM | POA: Diagnosis not present

## 2023-03-06 DIAGNOSIS — L820 Inflamed seborrheic keratosis: Secondary | ICD-10-CM

## 2023-03-06 NOTE — Progress Notes (Signed)
Follow-Up Visit   Subjective  Brent [name]. is a 78 y.o. male who presents for the following: Skin Cancer Screening and Upper Body Skin Exam. Hx of SCC. Hx of AKs. Areas of concern on face and arms.   The patient presents for Upper Body Skin Exam (UBSE) for skin cancer screening and mole check. The patient has spots, moles and lesions to be evaluated, some may be new or changing and the patient may have concern these could be cancer.    The following portions of the chart were reviewed this encounter and updated as appropriate: medications, allergies, medical history  Review of Systems:  No other skin or systemic complaints except as noted in HPI or Assessment and Plan.  Objective  Well appearing patient in no apparent distress; mood and affect are within normal limits.  All skin waist up examined. Relevant physical exam findings are noted in the Assessment and Plan.  Right Postauricular Scalp 9 mm violaceous crusted papule  Left sideburn, residual x1, R forearm x1 (2) Erythematous keratotic or waxy stuck-on papule  Assessment & Plan   NEOPLASM OF SKIN Right Postauricular Scalp Epidermal / dermal shaving  Lesion diameter (cm):  0.9 Informed consent: discussed and consent obtained   Patient was prepped and draped in usual sterile fashion: Area prepped with alcohol. Anesthesia: the lesion was anesthetized in a standard fashion   Anesthetic:  1% lidocaine w/ epinephrine 1-100,000 buffered w/ 8.4% NaHCO3 Instrument used: flexible razor blade   Hemostasis achieved with: pressure, aluminum chloride and electrodesiccation   Outcome: patient tolerated procedure well   Post-procedure details: wound care instructions given   Post-procedure details comment:  Ointment and small bandage applied Specimen 1 - Surgical pathology Differential Diagnosis: Irritated Hemangioma vs other  Check Margins: No Symptomatic, irritating, patient would like treated.  INFLAMED SEBORRHEIC  KERATOSIS (2) Left sideburn, residual x1, R forearm x1 (2) Symptomatic, irritating, patient would like treated. Destruction of lesion - Left sideburn, residual x1, R forearm x1 (2)  Destruction method: cryotherapy   Informed consent: discussed and consent obtained   Lesion destroyed using liquid nitrogen: Yes   Region frozen until ice ball extended beyond lesion: Yes   Outcome: patient tolerated procedure well with no complications   Post-procedure details: wound care instructions given   Additional details:  Prior to procedure, discussed risks of blister formation, small wound, skin dyspigmentation, or rare scar following cryotherapy. Recommend Vaseline ointment to treated areas while healing.  Skin cancer screening performed today.  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 - No evidence of recurrence today - Recommend regular full body skin exams - Recommend daily broad spectrum sunscreen SPF 30+ to sun-exposed areas, reapply every 2 hours as needed.  - Call if any new or changing lesions are noted between office visits     Actinic Damage - Chronic condition, secondary to cumulative UV/sun exposure - diffuse scaly erythematous macules with underlying dyspigmentation - Recommend daily broad spectrum sunscreen SPF 30+ to sun-exposed areas, reapply every 2 hours as needed.  - Staying in the shade or wearing long sleeves, sun glasses (UVA+UVB protection) and wide brim hats (4-inch brim around the entire circumference of the hat) are also recommended for sun protection.  - Call for new or changing lesions.  Lentigines, Seborrheic Keratoses, Hemangiomas - Benign normal skin lesions - Benign-appearing - Call for any changes  Melanocytic Nevi - Tan-brown and/or pink-flesh-colored symmetric macules and papules - Benign appearing on exam today -  Observation - Call clinic for new or changing moles - Recommend daily use of broad spectrum spf 30+  sunscreen to sun-exposed areas.   Skin cancer screening performed today.   Return in about 1 year (around 03/05/2024) for TBSE, With Dr. Gwen Pounds.  I, Lawson Radar, CMA, am acting as scribe for Willeen Niece, MD.   Documentation: I have reviewed the above documentation for accuracy and completeness, and I agree with the above.  Willeen Niece, MD

## 2023-03-06 NOTE — Patient Instructions (Addendum)
Cryotherapy Aftercare  Wash gently with soap and water everyday.   Apply Vaseline Jelly daily until healed.    Wound Care Instructions  Cleanse wound gently with soap and water once a day then pat dry with clean gauze. Apply a thin coat of Petrolatum (petroleum jelly, "Vaseline") over the wound (unless you have an allergy to this). We recommend that you use a new, sterile tube of Vaseline. Do not pick or remove scabs. Do not remove the yellow or white "healing tissue" from the base of the wound.  Cover the wound with fresh, clean, nonstick gauze and secure with paper tape. You may use Band-Aids in place of gauze and tape if the wound is small enough, but would recommend trimming much of the tape off as there is often too much. Sometimes Band-Aids can irritate the skin.  You should call the office for your biopsy report after 1 week if you have not already been contacted.  If you experience any problems, such as abnormal amounts of bleeding, swelling, significant bruising, significant pain, or evidence of infection, please call the office immediately.  FOR ADULT SURGERY PATIENTS: If you need something for pain relief you may take 1 extra strength Tylenol (acetaminophen) AND 2 Ibuprofen (200mg  each) together every 4 hours as needed for pain. (do not take these if you are allergic to them or if you have a reason you should not take them.) Typically, you may only need pain medication for 1 to 3 days.      Recommend daily broad spectrum sunscreen SPF 30+ to sun-exposed areas, reapply every 2 hours as needed. Call for new or changing lesions.  Staying in the shade or wearing long sleeves, sun glasses (UVA+UVB protection) and wide brim hats (4-inch brim around the entire circumference of the hat) are also recommended for sun protection.    Melanoma ABCDEs  Melanoma is the most dangerous type of skin cancer, and is the leading cause of death from skin disease.  You are more likely to develop  melanoma if you: Have light-colored skin, light-colored eyes, or red or blond hair Spend a lot of time in the sun Tan regularly, either outdoors or in a tanning bed Have had blistering sunburns, especially during childhood Have a close family member who has had a melanoma Have atypical moles or large birthmarks  Early detection of melanoma is key since treatment is typically straightforward and cure rates are extremely high if we catch it early.   The first sign of melanoma is often a change in a mole or a new dark spot.  The ABCDE system is a way of remembering the signs of melanoma.  A for asymmetry:  The two halves do not match. B for border:  The edges of the growth are irregular. C for color:  A mixture of colors are present instead of an even brown color. D for diameter:  Melanomas are usually (but not always) greater than 6mm - the size of a pencil eraser. E for evolution:  The spot keeps changing in size, shape, and color.  Please check your skin once per month between visits. You can use a small mirror in front and a large mirror behind you to keep an eye on the back side or your body.   If you see any new or changing lesions before your next follow-up, please call to schedule a visit.  Please continue daily skin protection including broad spectrum sunscreen SPF 30+ to sun-exposed areas, reapplying every 2 hours  as needed when you're outdoors.   Staying in the shade or wearing long sleeves, sun glasses (UVA+UVB protection) and wide brim hats (4-inch brim around the entire circumference of the hat) are also recommended for sun protection.     Due to recent changes in healthcare laws, you may see results of your pathology and/or laboratory studies on MyChart before the doctors have had a chance to review them. We understand that in some cases there may be results that are confusing or concerning to you. Please understand that not all results are received at the same time and often  the doctors may need to interpret multiple results in order to provide you with the best plan of care or course of treatment. Therefore, we ask that you please give [place] 2 business days to thoroughly review all your results before contacting the office for clarification. Should we see a critical lab result, you will be contacted sooner.   If You Need Anything After Your Visit  If you have any questions or concerns for your doctor, please call our main line at [phone] and press option 4 to reach your doctor's medical assistant. If no one answers, please leave a voicemail as directed and we will return your call as soon as possible. Messages left after 4 pm will be answered the following business day.   You may also send [place] a message via MyChart. We typically respond to MyChart messages within 1-2 business days.  For prescription refills, please ask your pharmacy to contact our office. Our fax number is [phone].  If you have an urgent issue when the clinic is closed that cannot wait until the next business day, you can page your doctor at the number below.    Please note that while we do our best to be available for urgent issues outside of office hours, we are not available 24/7.   If you have an urgent issue and are unable to reach [place], you may choose to seek medical care at your doctor's office, retail clinic, urgent care center, or emergency room.  If you have a medical emergency, please immediately call 911 or go to the emergency department.  Pager Numbers  - Dr. Gwen Pounds: [phone]  - Dr. Roseanne Reno: [phone]  - Dr. Katrinka Blazing: [phone]   In the event of inclement weather, please call our main line at [phone] for an update on the status of any delays or closures.  Dermatology Medication Tips: Please keep the boxes that topical medications come in in order to help keep track of the instructions about where and how to use these. Pharmacies typically print the medication  instructions only on the boxes and not directly on the medication tubes.   If your medication is too expensive, please contact our office at [phone] option 4 or send [place] a message through MyChart.   We are unable to tell what your co-pay for medications will be in advance as this is different depending on your insurance coverage. However, we may be able to find a substitute medication at lower cost or fill out paperwork to get insurance to cover a needed medication.   If a prior authorization is required to get your medication covered by your insurance company, please allow [place] 1-2 business days to complete this process.  Drug prices often vary depending on where the prescription is filled and some pharmacies may offer cheaper prices.  The website www.goodrx.com contains coupons for medications through different pharmacies. The prices here do not account  for what the cost may be with help from insurance (it may be cheaper with your insurance), but the website can give you the price if you did not use any insurance.  - You can print the associated coupon and take it with your prescription to the pharmacy.  - You may also stop by our office during regular business hours and pick up a GoodRx coupon card.  - If you need your prescription sent electronically to a different pharmacy, notify our office through [facility] or by phone at [phone] option 4.     Si Usted Necesita Algo Despus de Su Visita  Tambin puede enviarnos un mensaje a travs de Clinical cytogeneticist. Por lo general respondemos a los mensajes de MyChart en el transcurso de 1 a 2 das hbiles.  Para renovar recetas, por favor pida a su farmacia que se ponga en contacto con nuestra oficina. Annie Sable de fax es [place] [phone].  Si tiene un asunto urgente cuando la clnica est cerrada y que no puede esperar hasta el siguiente da hbil, puede llamar/localizar a su doctor(a) al nmero que aparece a continuacin.   Por  favor, tenga en cuenta que aunque hacemos todo lo posible para estar disponibles para asuntos urgentes fuera del horario de [place], no estamos disponibles las 24 horas del da, los 7 [address]  [address] de la [place].   Si tiene un problema urgente y no puede comunicarse con nosotros, puede optar por buscar atencin mdica  en el consultorio de su doctor(a), en una clnica privada, en un centro de atencin urgente o en una sala de emergencias.  Si tiene Engineer, drilling, por favor llame inmediatamente al 911 o vaya a la sala de emergencias.  Nmeros de bper  - Dr. Gwen Pounds: [phone]  - Dra. Roseanne Reno: [phone]  - Dr. Katrinka Blazing: [phone]   En caso de inclemencias del tiempo, por favor llame a Lacy Duverney principal al [phone] para una actualizacin sobre el [place] de cualquier retraso o cierre.  Consejos para la medicacin en dermatologa: Por favor, guarde las cajas en las que vienen los medicamentos de uso tpico para ayudarle a seguir las instrucciones sobre dnde y cmo usarlos. Las farmacias generalmente imprimen las instrucciones del medicamento slo en las cajas y no directamente en los tubos del [place].   Si su medicamento es muy caro, por favor, pngase en contacto con Rolm [place]ala llamando al [phone] y presione la opcin 4 o envenos un mensaje a travs de Clinical cytogeneticist.   No podemos decirle cul ser su copago por los medicamentos por adelantado ya que esto es diferente dependiendo de la cobertura de su seguro. Sin embargo, es posible que podamos encontrar un medicamento sustituto a Audiological scientist un formulario para que el seguro cubra el medicamento que se considera necesario.   Si se requiere una autorizacin previa para que su compaa de seguros [place] su medicamento, por favor permtanos de 1 a 2 das hbiles para completar [address]  Los precios de los medicamentos varan con frecuencia dependiendo del Environmental consultant de dnde se surte la receta y alguna farmacias  pueden ofrecer precios ms baratos.  El sitio web www.goodrx.com tiene cupones para medicamentos de Health and safety inspector. Los precios aqu no tienen en cuenta lo que podra costar con la ayuda del seguro (puede ser ms barato con su seguro), pero el sitio web puede darle el precio si no utiliz Tourist information centre manager.  - Puede imprimir el cupn correspondiente y llevarlo con su receta a la farmacia.  -  Tambin puede pasar por nuestra oficina durante el horario de atencin regular y Education officer, museum una tarjeta de cupones de GoodRx.  - Si necesita que su receta se enve electrnicamente a una farmacia diferente, informe a nuestra oficina a travs de MyChart de [place] o por telfono llamando al [phone] y presione la opcin 4.

## 2023-03-08 LAB — SURGICAL PATHOLOGY

## 2023-03-11 ENCOUNTER — Telehealth: Payer: Self-pay

## 2023-03-11 NOTE — Telephone Encounter (Signed)
-----   Message from Willeen Niece sent at 03/11/2023 10:08 AM EST ----- 1. Skin, right postauricular scalp :       HEMANGIOMA   Benign - please call patient

## 2023-03-11 NOTE — Telephone Encounter (Signed)
Advised pts wife of bx results/sh ?

## 2023-04-04 DIAGNOSIS — Z862 Personal history of diseases of the blood and blood-forming organs and certain disorders involving the immune mechanism: Secondary | ICD-10-CM | POA: Diagnosis not present

## 2023-04-04 DIAGNOSIS — E1140 Type 2 diabetes mellitus with diabetic neuropathy, unspecified: Secondary | ICD-10-CM | POA: Diagnosis not present

## 2023-04-04 DIAGNOSIS — E782 Mixed hyperlipidemia: Secondary | ICD-10-CM | POA: Diagnosis not present

## 2023-04-08 DIAGNOSIS — E1169 Type 2 diabetes mellitus with other specified complication: Secondary | ICD-10-CM | POA: Diagnosis not present

## 2023-04-08 DIAGNOSIS — E785 Hyperlipidemia, unspecified: Secondary | ICD-10-CM | POA: Diagnosis not present

## 2023-04-08 DIAGNOSIS — G8929 Other chronic pain: Secondary | ICD-10-CM | POA: Diagnosis not present

## 2023-04-08 DIAGNOSIS — E1142 Type 2 diabetes mellitus with diabetic polyneuropathy: Secondary | ICD-10-CM | POA: Diagnosis not present

## 2023-04-08 DIAGNOSIS — I10 Essential (primary) hypertension: Secondary | ICD-10-CM | POA: Diagnosis not present

## 2023-04-08 DIAGNOSIS — K219 Gastro-esophageal reflux disease without esophagitis: Secondary | ICD-10-CM | POA: Diagnosis not present

## 2023-04-08 DIAGNOSIS — E1165 Type 2 diabetes mellitus with hyperglycemia: Secondary | ICD-10-CM | POA: Diagnosis not present

## 2023-04-08 DIAGNOSIS — N521 Erectile dysfunction due to diseases classified elsewhere: Secondary | ICD-10-CM | POA: Diagnosis not present

## 2023-04-08 DIAGNOSIS — G4700 Insomnia, unspecified: Secondary | ICD-10-CM | POA: Diagnosis not present

## 2023-04-08 DIAGNOSIS — M1990 Unspecified osteoarthritis, unspecified site: Secondary | ICD-10-CM | POA: Diagnosis not present

## 2023-04-08 DIAGNOSIS — H9193 Unspecified hearing loss, bilateral: Secondary | ICD-10-CM | POA: Diagnosis not present

## 2023-04-10 DIAGNOSIS — E1140 Type 2 diabetes mellitus with diabetic neuropathy, unspecified: Secondary | ICD-10-CM | POA: Diagnosis not present

## 2023-04-10 DIAGNOSIS — E782 Mixed hyperlipidemia: Secondary | ICD-10-CM | POA: Diagnosis not present

## 2023-04-10 DIAGNOSIS — E6609 Other obesity due to excess calories: Secondary | ICD-10-CM | POA: Diagnosis not present

## 2023-04-10 DIAGNOSIS — I10 Essential (primary) hypertension: Secondary | ICD-10-CM | POA: Diagnosis not present

## 2023-04-10 DIAGNOSIS — Z862 Personal history of diseases of the blood and blood-forming organs and certain disorders involving the immune mechanism: Secondary | ICD-10-CM | POA: Diagnosis not present

## 2023-04-16 DIAGNOSIS — Z96652 Presence of left artificial knee joint: Secondary | ICD-10-CM | POA: Diagnosis not present

## 2023-06-24 ENCOUNTER — Encounter: Payer: Self-pay | Admitting: Dermatology

## 2023-06-24 ENCOUNTER — Ambulatory Visit: Admitting: Dermatology

## 2023-06-24 DIAGNOSIS — L820 Inflamed seborrheic keratosis: Secondary | ICD-10-CM

## 2023-06-24 DIAGNOSIS — W908XXA Exposure to other nonionizing radiation, initial encounter: Secondary | ICD-10-CM | POA: Diagnosis not present

## 2023-06-24 DIAGNOSIS — D1801 Hemangioma of skin and subcutaneous tissue: Secondary | ICD-10-CM | POA: Diagnosis not present

## 2023-06-24 DIAGNOSIS — Z85828 Personal history of other malignant neoplasm of skin: Secondary | ICD-10-CM

## 2023-06-24 DIAGNOSIS — L578 Other skin changes due to chronic exposure to nonionizing radiation: Secondary | ICD-10-CM | POA: Diagnosis not present

## 2023-06-24 DIAGNOSIS — L570 Actinic keratosis: Secondary | ICD-10-CM

## 2023-06-24 DIAGNOSIS — L219 Seborrheic dermatitis, unspecified: Secondary | ICD-10-CM

## 2023-06-24 NOTE — Progress Notes (Signed)
 Follow-Up Visit   Subjective  Brent [name]. is a 78 y.o. male who presents for the following: Pt has noticed scabbing on the L ear where a previous skin cancer was treated, and he would like it checked today. Pt has other skin lesions on the face that he would like removed today as well.  The patient has spots, moles and lesions to be evaluated, some may be new or changing and the patient may have concern these could be cancer.  Pt accompanied by wife who contributes to history.   The following portions of the chart were reviewed this encounter and updated as appropriate: medications, allergies, medical history  Review of Systems:  No other skin or systemic complaints except as noted in HPI or Assessment and Plan.  Objective  Well appearing patient in no apparent distress; mood and affect are within normal limits.  A focused examination was performed of the following areas: the face and ears   Relevant exam findings are noted in the Assessment and Plan.  L lat forehead x 1, L temple x 1, L cheek x 1, central forehead x 1, nasal tip x 1, R preauricular x 1, R tragus x 1 (7) Erythematous thin papules/macules with gritty scale.  L sideburn x 1 Erythematous stuck-on, waxy papule or plaque  Assessment & Plan   ACTINIC DAMAGE - chronic, secondary to cumulative UV radiation exposure/sun exposure over time - diffuse scaly erythematous macules with underlying dyspigmentation - Recommend daily broad spectrum sunscreen SPF 30+ to sun-exposed areas, reapply every 2 hours as needed.  - Recommend staying in the shade or wearing long sleeves, sun glasses (UVA+UVB protection) and wide brim hats (4-inch brim around the entire circumference of the hat). - Call for new or changing lesions.  HISTORY OF SQUAMOUS CELL CARCINOMA OF THE SKIN - No evidence of recurrence today - No lymphadenopathy - Recommend regular full body skin exams - Recommend daily broad spectrum sunscreen SPF 30+ to  sun-exposed areas, reapply every 2 hours as needed.  - Call if any new or changing lesions are noted between office visits AK (ACTINIC KERATOSIS) (7) L lat forehead x 1, L temple x 1, L cheek x 1, central forehead x 1, nasal tip x 1, R preauricular x 1, R tragus x 1 (7) Actinic keratoses are precancerous spots that appear secondary to cumulative UV radiation exposure/sun exposure over time. They are chronic with expected duration over 1 year. A portion of actinic keratoses will progress to squamous cell carcinoma of the skin. It is not possible to reliably predict which spots will progress to skin cancer and so treatment is recommended to prevent development of skin cancer.  Recommend daily broad spectrum sunscreen SPF 30+ to sun-exposed areas, reapply every 2 hours as needed.  Recommend staying in the shade or wearing long sleeves, sun glasses (UVA+UVB protection) and wide brim hats (4-inch brim around the entire circumference of the hat). Call for new or changing lesions.  Destruction of lesion - L lat forehead x 1, L temple x 1, L cheek x 1, central forehead x 1, nasal tip x 1, R preauricular x 1, R tragus x 1 (7) Complexity: simple   Destruction method: cryotherapy   Informed consent: discussed and consent obtained   Timeout:  patient name, date of birth, surgical site, and procedure verified Lesion destroyed using liquid nitrogen: Yes   Region frozen until ice ball extended beyond lesion: Yes   Outcome: patient tolerated procedure well with no  complications   Post-procedure details: wound care instructions given   INFLAMED SEBORRHEIC KERATOSIS L sideburn x 1 Symptomatic, irritating, patient would like treated.  Destruction of lesion - L sideburn x 1 Complexity: simple   Destruction method: cryotherapy   Informed consent: discussed and consent obtained   Timeout:  patient name, date of birth, surgical site, and procedure verified Lesion destroyed using liquid nitrogen: Yes   Region  frozen until ice ball extended beyond lesion: Yes   Outcome: patient tolerated procedure well with no complications   Post-procedure details: wound care instructions given   ACTINIC ELASTOSIS    HISTORY OF SQUAMOUS CELL CARCINOMA OF THE SKIN - L ear helix, tx with Mohs - No evidence of recurrence today - No lymphadenopathy - Recommend regular full body skin exams - Recommend daily broad spectrum sunscreen SPF 30+ to sun-exposed areas, reapply every 2 hours as needed.  - Call if any new or changing lesions are noted between office visits  HEMANGIOMA Exam: red papule(s) Discussed benign nature. Recommend observation. Call for changes.  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  Chronic and persistent condition with duration or expected duration over one year. Condition is symptomatic/ bothersome to patient. Not currently at goal.   Seborrheic Dermatitis is a chronic persistent rash characterized by pinkness and scaling most commonly of the mid face but also can occur on the scalp (dandruff), ears; mid chest, mid back and groin.  It tends to be exacerbated by stress and cooler weather.  People who have neurologic disease may experience new onset or exacerbation of existing seborrheic dermatitis.  The condition is not curable but treatable and can be controlled.  Treatment Plan: Continue Nizoral  shampoo to aa's twice per week. Let sit 5 minutes then wash off.   Return for appointment as scheduled.  Arlinda Lais, CMA, am acting as scribe for Harris Liming, MD .   Documentation: I have reviewed the above documentation for accuracy and completeness, and I agree with the above.  Harris Liming, MD

## 2023-06-24 NOTE — Patient Instructions (Signed)

## 2023-06-26 DIAGNOSIS — H6123 Impacted cerumen, bilateral: Secondary | ICD-10-CM | POA: Diagnosis not present

## 2023-06-26 DIAGNOSIS — H903 Sensorineural hearing loss, bilateral: Secondary | ICD-10-CM | POA: Diagnosis not present

## 2023-07-24 DIAGNOSIS — E113293 Type 2 diabetes mellitus with mild nonproliferative diabetic retinopathy without macular edema, bilateral: Secondary | ICD-10-CM | POA: Diagnosis not present

## 2023-07-24 DIAGNOSIS — H401131 Primary open-angle glaucoma, bilateral, mild stage: Secondary | ICD-10-CM | POA: Diagnosis not present

## 2023-07-24 DIAGNOSIS — H2513 Age-related nuclear cataract, bilateral: Secondary | ICD-10-CM | POA: Diagnosis not present

## 2023-10-04 DIAGNOSIS — Z130 Encounter for screening for diseases of the blood and blood-forming organs and certain disorders involving the immune mechanism: Secondary | ICD-10-CM | POA: Diagnosis not present

## 2023-10-04 DIAGNOSIS — E1140 Type 2 diabetes mellitus with diabetic neuropathy, unspecified: Secondary | ICD-10-CM | POA: Diagnosis not present

## 2023-10-04 DIAGNOSIS — E782 Mixed hyperlipidemia: Secondary | ICD-10-CM | POA: Diagnosis not present

## 2023-10-04 DIAGNOSIS — Z862 Personal history of diseases of the blood and blood-forming organs and certain disorders involving the immune mechanism: Secondary | ICD-10-CM | POA: Diagnosis not present

## 2023-10-11 DIAGNOSIS — R351 Nocturia: Secondary | ICD-10-CM | POA: Diagnosis not present

## 2023-10-11 DIAGNOSIS — I10 Essential (primary) hypertension: Secondary | ICD-10-CM | POA: Diagnosis not present

## 2023-10-11 DIAGNOSIS — E1140 Type 2 diabetes mellitus with diabetic neuropathy, unspecified: Secondary | ICD-10-CM | POA: Diagnosis not present

## 2023-10-11 DIAGNOSIS — N401 Enlarged prostate with lower urinary tract symptoms: Secondary | ICD-10-CM | POA: Diagnosis not present

## 2023-10-11 DIAGNOSIS — R809 Proteinuria, unspecified: Secondary | ICD-10-CM | POA: Diagnosis not present

## 2023-10-11 DIAGNOSIS — E782 Mixed hyperlipidemia: Secondary | ICD-10-CM | POA: Diagnosis not present

## 2023-10-11 DIAGNOSIS — E6609 Other obesity due to excess calories: Secondary | ICD-10-CM | POA: Diagnosis not present

## 2023-12-04 DIAGNOSIS — H6123 Impacted cerumen, bilateral: Secondary | ICD-10-CM | POA: Diagnosis not present

## 2023-12-04 DIAGNOSIS — H903 Sensorineural hearing loss, bilateral: Secondary | ICD-10-CM | POA: Diagnosis not present

## 2023-12-09 DIAGNOSIS — Z23 Encounter for immunization: Secondary | ICD-10-CM | POA: Diagnosis not present

## 2023-12-27 NOTE — Progress Notes (Signed)
 Brent [name].                                          MRN: [id number]   12/27/2023   The [facility] Quality Team Specialist reviewed this patient medical record for the purposes of chart review for care gap closure. The following were reviewed: chart review for care gap closure-controlling blood pressure.    [facility] Quality Team

## 2024-03-05 ENCOUNTER — Encounter: Payer: PPO | Admitting: Dermatology

## 2024-03-24 ENCOUNTER — Ambulatory Visit: Admitting: Dermatology
